# Patient Record
Sex: Female | Born: 1940 | Race: Black or African American | Hispanic: No | Smoking: Never smoker
Health system: Southern US, Community
[De-identification: ages and names within clinical notes are randomized; demographics above are authoritative.]

## PROBLEM LIST (undated history)

## (undated) DIAGNOSIS — G4730 Sleep apnea, unspecified: Secondary | ICD-10-CM

## (undated) DIAGNOSIS — I10 Essential (primary) hypertension: Secondary | ICD-10-CM

## (undated) DIAGNOSIS — F329 Major depressive disorder, single episode, unspecified: Secondary | ICD-10-CM

## (undated) DIAGNOSIS — T8859XA Other complications of anesthesia, initial encounter: Secondary | ICD-10-CM

## (undated) DIAGNOSIS — T4145XA Adverse effect of unspecified anesthetic, initial encounter: Secondary | ICD-10-CM

## (undated) DIAGNOSIS — F32A Depression, unspecified: Secondary | ICD-10-CM

## (undated) DIAGNOSIS — Z972 Presence of dental prosthetic device (complete) (partial): Secondary | ICD-10-CM

## (undated) DIAGNOSIS — E785 Hyperlipidemia, unspecified: Secondary | ICD-10-CM

## (undated) DIAGNOSIS — E119 Type 2 diabetes mellitus without complications: Secondary | ICD-10-CM

## (undated) HISTORY — PX: BILATERAL CARPAL TUNNEL RELEASE: SHX6508

## (undated) HISTORY — DX: Type 2 diabetes mellitus without complications: E11.9

## (undated) HISTORY — PX: BACK SURGERY: SHX140

## (undated) HISTORY — DX: Major depressive disorder, single episode, unspecified: F32.9

## (undated) HISTORY — DX: Depression, unspecified: F32.A

## (undated) HISTORY — PX: ABDOMINAL HYSTERECTOMY: SHX81

## (undated) HISTORY — DX: Sleep apnea, unspecified: G47.30

## (undated) HISTORY — PX: FOOT SURGERY: SHX648

---
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
## 1996-05-09 HISTORY — PX: LUMBAR DISC SURGERY: SHX700

## 2002-05-09 HISTORY — PX: CERVICAL FUSION: SHX112

## 2015-06-18 DIAGNOSIS — M5137 Other intervertebral disc degeneration, lumbosacral region: Secondary | ICD-10-CM | POA: Diagnosis not present

## 2015-06-18 DIAGNOSIS — M5430 Sciatica, unspecified side: Secondary | ICD-10-CM | POA: Diagnosis not present

## 2015-06-18 DIAGNOSIS — M4806 Spinal stenosis, lumbar region: Secondary | ICD-10-CM | POA: Diagnosis not present

## 2015-06-18 DIAGNOSIS — M4316 Spondylolisthesis, lumbar region: Secondary | ICD-10-CM | POA: Diagnosis not present

## 2015-06-18 DIAGNOSIS — M47816 Spondylosis without myelopathy or radiculopathy, lumbar region: Secondary | ICD-10-CM | POA: Diagnosis not present

## 2015-06-18 DIAGNOSIS — M5127 Other intervertebral disc displacement, lumbosacral region: Secondary | ICD-10-CM | POA: Diagnosis not present

## 2015-06-18 DIAGNOSIS — M47815 Spondylosis without myelopathy or radiculopathy, thoracolumbar region: Secondary | ICD-10-CM | POA: Diagnosis not present

## 2015-07-17 DIAGNOSIS — M24571 Contracture, right ankle: Secondary | ICD-10-CM | POA: Diagnosis not present

## 2015-07-17 DIAGNOSIS — M79671 Pain in right foot: Secondary | ICD-10-CM | POA: Diagnosis not present

## 2015-07-17 DIAGNOSIS — M7661 Achilles tendinitis, right leg: Secondary | ICD-10-CM | POA: Diagnosis not present

## 2015-07-17 DIAGNOSIS — M25471 Effusion, right ankle: Secondary | ICD-10-CM | POA: Diagnosis not present

## 2015-07-21 DIAGNOSIS — M5416 Radiculopathy, lumbar region: Secondary | ICD-10-CM | POA: Diagnosis not present

## 2015-07-21 DIAGNOSIS — I10 Essential (primary) hypertension: Secondary | ICD-10-CM | POA: Diagnosis not present

## 2015-07-21 DIAGNOSIS — M4316 Spondylolisthesis, lumbar region: Secondary | ICD-10-CM | POA: Diagnosis not present

## 2015-07-21 DIAGNOSIS — Z6841 Body Mass Index (BMI) 40.0 and over, adult: Secondary | ICD-10-CM | POA: Diagnosis not present

## 2015-07-21 DIAGNOSIS — M4726 Other spondylosis with radiculopathy, lumbar region: Secondary | ICD-10-CM | POA: Diagnosis not present

## 2015-07-22 DIAGNOSIS — J302 Other seasonal allergic rhinitis: Secondary | ICD-10-CM | POA: Diagnosis not present

## 2015-07-22 DIAGNOSIS — I10 Essential (primary) hypertension: Secondary | ICD-10-CM | POA: Diagnosis not present

## 2015-08-11 DIAGNOSIS — M25571 Pain in right ankle and joints of right foot: Secondary | ICD-10-CM | POA: Diagnosis not present

## 2015-08-11 DIAGNOSIS — M25572 Pain in left ankle and joints of left foot: Secondary | ICD-10-CM | POA: Diagnosis not present

## 2015-08-11 DIAGNOSIS — R262 Difficulty in walking, not elsewhere classified: Secondary | ICD-10-CM | POA: Diagnosis not present

## 2015-08-14 DIAGNOSIS — M25572 Pain in left ankle and joints of left foot: Secondary | ICD-10-CM | POA: Diagnosis not present

## 2015-08-14 DIAGNOSIS — M25571 Pain in right ankle and joints of right foot: Secondary | ICD-10-CM | POA: Diagnosis not present

## 2015-08-14 DIAGNOSIS — R262 Difficulty in walking, not elsewhere classified: Secondary | ICD-10-CM | POA: Diagnosis not present

## 2015-08-20 DIAGNOSIS — R262 Difficulty in walking, not elsewhere classified: Secondary | ICD-10-CM | POA: Diagnosis not present

## 2015-08-20 DIAGNOSIS — M25572 Pain in left ankle and joints of left foot: Secondary | ICD-10-CM | POA: Diagnosis not present

## 2015-08-20 DIAGNOSIS — M25571 Pain in right ankle and joints of right foot: Secondary | ICD-10-CM | POA: Diagnosis not present

## 2015-08-24 DIAGNOSIS — I10 Essential (primary) hypertension: Secondary | ICD-10-CM | POA: Diagnosis not present

## 2015-08-24 DIAGNOSIS — E119 Type 2 diabetes mellitus without complications: Secondary | ICD-10-CM | POA: Diagnosis not present

## 2015-08-24 DIAGNOSIS — K219 Gastro-esophageal reflux disease without esophagitis: Secondary | ICD-10-CM | POA: Diagnosis not present

## 2015-08-24 DIAGNOSIS — M1990 Unspecified osteoarthritis, unspecified site: Secondary | ICD-10-CM | POA: Diagnosis not present

## 2015-09-03 DIAGNOSIS — Z1231 Encounter for screening mammogram for malignant neoplasm of breast: Secondary | ICD-10-CM | POA: Diagnosis not present

## 2015-11-11 DIAGNOSIS — H527 Unspecified disorder of refraction: Secondary | ICD-10-CM | POA: Diagnosis not present

## 2015-11-11 DIAGNOSIS — Z7984 Long term (current) use of oral hypoglycemic drugs: Secondary | ICD-10-CM | POA: Diagnosis not present

## 2015-11-11 DIAGNOSIS — E119 Type 2 diabetes mellitus without complications: Secondary | ICD-10-CM | POA: Diagnosis not present

## 2015-11-20 DIAGNOSIS — G4733 Obstructive sleep apnea (adult) (pediatric): Secondary | ICD-10-CM | POA: Diagnosis not present

## 2015-12-02 DIAGNOSIS — E785 Hyperlipidemia, unspecified: Secondary | ICD-10-CM | POA: Diagnosis not present

## 2015-12-02 DIAGNOSIS — I10 Essential (primary) hypertension: Secondary | ICD-10-CM | POA: Diagnosis not present

## 2015-12-02 DIAGNOSIS — E119 Type 2 diabetes mellitus without complications: Secondary | ICD-10-CM | POA: Diagnosis not present

## 2015-12-02 DIAGNOSIS — Z0000 Encounter for general adult medical examination without abnormal findings: Secondary | ICD-10-CM | POA: Diagnosis not present

## 2015-12-02 DIAGNOSIS — K219 Gastro-esophageal reflux disease without esophagitis: Secondary | ICD-10-CM | POA: Diagnosis not present

## 2015-12-23 DIAGNOSIS — I10 Essential (primary) hypertension: Secondary | ICD-10-CM | POA: Diagnosis not present

## 2015-12-23 DIAGNOSIS — R69 Illness, unspecified: Secondary | ICD-10-CM | POA: Diagnosis not present

## 2015-12-23 DIAGNOSIS — H938X9 Other specified disorders of ear, unspecified ear: Secondary | ICD-10-CM | POA: Diagnosis not present

## 2015-12-23 DIAGNOSIS — E119 Type 2 diabetes mellitus without complications: Secondary | ICD-10-CM | POA: Diagnosis not present

## 2015-12-23 DIAGNOSIS — M1990 Unspecified osteoarthritis, unspecified site: Secondary | ICD-10-CM | POA: Diagnosis not present

## 2016-01-14 DIAGNOSIS — R69 Illness, unspecified: Secondary | ICD-10-CM | POA: Diagnosis not present

## 2016-01-25 DIAGNOSIS — M1990 Unspecified osteoarthritis, unspecified site: Secondary | ICD-10-CM | POA: Diagnosis not present

## 2016-01-25 DIAGNOSIS — M5430 Sciatica, unspecified side: Secondary | ICD-10-CM | POA: Diagnosis not present

## 2016-01-25 DIAGNOSIS — E119 Type 2 diabetes mellitus without complications: Secondary | ICD-10-CM | POA: Diagnosis not present

## 2016-01-25 DIAGNOSIS — R69 Illness, unspecified: Secondary | ICD-10-CM | POA: Diagnosis not present

## 2016-02-18 DIAGNOSIS — Z0101 Encounter for examination of eyes and vision with abnormal findings: Secondary | ICD-10-CM | POA: Diagnosis not present

## 2016-02-18 DIAGNOSIS — I10 Essential (primary) hypertension: Secondary | ICD-10-CM | POA: Diagnosis not present

## 2016-02-18 DIAGNOSIS — Z712 Person consulting for explanation of examination or test findings: Secondary | ICD-10-CM | POA: Diagnosis not present

## 2016-04-19 ENCOUNTER — Ambulatory Visit
Admission: EM | Admit: 2016-04-19 | Discharge: 2016-04-19 | Disposition: A | Discharge status: Home / Self Care | Payer: Medicare HMO | Attending: Family Medicine | Admitting: Family Medicine

## 2016-04-19 DIAGNOSIS — H6982 Other specified disorders of Eustachian tube, left ear: Secondary | ICD-10-CM | POA: Diagnosis not present

## 2016-04-19 HISTORY — DX: Essential (primary) hypertension: I10

## 2016-04-19 MED ORDER — LORATADINE 10 MG PO TABS: 10.0000 mg | ORAL_TABLET | Freq: Every day | ORAL | 0 refills | Status: DC

## 2016-04-19 MED ORDER — FLUTICASONE PROPIONATE 50 MCG/ACT NA SUSP: 2.0000 | Freq: Every day | NASAL | 0 refills | Status: DC

## 2016-04-19 NOTE — ED Triage Notes (Signed)
Patient complains of left ear pain x 3 days. Patient states that she also stuck a piece of cotton in her ear and is unsure if she removed it.

## 2016-04-19 NOTE — Discharge Instructions (Signed)
Recommend start Flonase 2 sprays in each nostril daily. May also take Claritin 10mg  once daily. Follow-up with a primary care provider in 4 to 5 days if not improving.

## 2016-04-19 NOTE — ED Provider Notes (Signed)
CSN: [id number]     Arrival date & time 04/19/16  1134 History   First MD Initiated Contact with Patient 04/19/16 1359     Chief Complaint  Patient presents with  . Otalgia    left   (Consider location/radiation/quality/duration/timing/severity/associated sxs/prior Treatment) 75 year old female presents with left ear pain on and off for the past 3 days. Feels pain along left neck as well. Denies any fever, cough, dizziness or GI symptoms. Also experiencing slight nasal congestion. Had placed a cotton swab at base of ear canal to block wind but uncertain if some of the cotton got stuck in her ear canal. Has history of seasonal allergies but has not taken any medication in many months.    The history is provided by the patient.    Past Medical History:  Diagnosis Date  . Hypertension    Past Surgical History:  Procedure Laterality Date  . ABDOMINAL HYSTERECTOMY    . BACK SURGERY     mulitple  . BILATERAL CARPAL TUNNEL RELEASE    . FOOT SURGERY     History reviewed. No pertinent family history. Social History  Substance Use Topics  . Smoking status: Never Smoker  . Smokeless tobacco: Never Used  . Alcohol use No   OB History    No data available     Review of Systems  Constitutional: Negative for chills, diaphoresis, fatigue and fever.  HENT: Positive for congestion, ear pain and postnasal drip. Negative for ear discharge, hearing loss, sinus pain, sinus pressure, sore throat and tinnitus.   Eyes: Negative for discharge.  Respiratory: Negative for cough, chest tightness and wheezing.   Cardiovascular: Negative for chest pain.  Gastrointestinal: Negative for abdominal pain, diarrhea, nausea and vomiting.  Musculoskeletal: Negative for neck pain and neck stiffness.  Skin: Negative for rash.  Neurological: Negative for dizziness, syncope, weakness, light-headedness and headaches.  Hematological: Negative for adenopathy.    Allergies  Patient has no known  allergies.  Home Medications   Prior to Admission medications   Medication Sig Start Date End Date Taking? Authorizing Provider  buPROPion (ZYBAN) 150 MG 12 hr tablet Take 150 mg by mouth 2 (two) times daily.   Yes Historical Provider, MD  [facility] Liver Oil 1000 MG CAPS Take by mouth.   Yes Historical Provider, MD  meloxicam (MOBIC) 15 MG tablet Take 15 mg by mouth daily.   Yes Historical Provider, MD  Omega-3 Fatty Acids (FISH OIL) 1200 MG CPDR Take by mouth.   Yes Historical Provider, MD  valsartan (DIOVAN) 320 MG tablet Take 320 mg by mouth daily.   Yes Historical Provider, MD  fluticasone (FLONASE) 50 MCG/ACT nasal spray Place 2 sprays into both nostrils daily. 04/19/16   Katy Apo, NP  loratadine (CLARITIN) 10 MG tablet Take 1 tablet (10 mg total) by mouth daily. 04/19/16 05/19/16  Katy Apo, NP   Meds Ordered and Administered this Visit  Medications - No data to display  BP 136/60 (BP Location: Left Arm)   Pulse 72   Temp 97.7 F (36.5 C) (Oral)   Resp 17   Ht 5\' 7"  (1.702 m)   Wt 204 lb (92.5 kg)   SpO2 99%   BMI 31.95 kg/m  No data found.   Physical Exam  Constitutional: She is oriented to person, place, and time. She appears well-developed and well-nourished. No distress.  HENT:  Head: Normocephalic and atraumatic.  Right Ear: Hearing, tympanic membrane, external ear and ear canal normal.  Left Ear: External ear and ear canal normal. No drainage, swelling or tenderness. No foreign bodies. Tympanic membrane is bulging. Tympanic membrane is not injected, not perforated and not erythematous. A middle ear effusion is present. Decreased hearing is noted.  Nose: Rhinorrhea present. Right sinus exhibits no maxillary sinus tenderness and no frontal sinus tenderness. Left sinus exhibits no maxillary sinus tenderness and no frontal sinus tenderness.  Mouth/Throat: Uvula is midline, oropharynx is clear and moist and mucous membranes are normal.  Neck: Normal range of  motion. Neck supple.  Cardiovascular: Normal rate, regular rhythm and normal heart sounds.   Pulmonary/Chest: Effort normal and breath sounds normal. No respiratory distress. She has no decreased breath sounds. She has no wheezes. She has no rhonchi.  Lymphadenopathy:    She has no cervical adenopathy.  Neurological: She is alert and oriented to person, place, and time.  Skin: Skin is warm and dry. Capillary refill takes less than 2 seconds.  Psychiatric: She has a normal mood and affect. Her behavior is normal. Judgment and thought content normal.    Urgent Care Course   Clinical Course     Procedures (including critical care time)  Labs Review Labs Reviewed - No data to display  Imaging Review No results found.   Visual Acuity Review  Right Eye Distance:   Left Eye Distance:   Bilateral Distance:    Right Eye Near:   Left Eye Near:    Bilateral Near:         MDM   1. Acute dysfunction of left eustachian tube    Discussed with patient that no foreign bodies/material seen in ear canal. Also discussed that she has fluid behind her left TM but no infection. Recommend trial Flonase 2 sprays each nostril once daily. May also restart Claritin 10mg  once daily. Recommend follow-up with a PCP in 4 to 5 days if not improving.      Katy Apo, NP 04/20/16 [phone]

## 2016-05-11 DIAGNOSIS — K219 Gastro-esophageal reflux disease without esophagitis: Secondary | ICD-10-CM | POA: Diagnosis not present

## 2016-05-11 DIAGNOSIS — E119 Type 2 diabetes mellitus without complications: Secondary | ICD-10-CM | POA: Diagnosis not present

## 2016-05-11 DIAGNOSIS — M1990 Unspecified osteoarthritis, unspecified site: Secondary | ICD-10-CM | POA: Diagnosis not present

## 2016-05-11 DIAGNOSIS — G4730 Sleep apnea, unspecified: Secondary | ICD-10-CM | POA: Diagnosis not present

## 2016-05-11 DIAGNOSIS — M25559 Pain in unspecified hip: Secondary | ICD-10-CM | POA: Diagnosis not present

## 2016-05-11 DIAGNOSIS — M5430 Sciatica, unspecified side: Secondary | ICD-10-CM | POA: Diagnosis not present

## 2016-05-11 DIAGNOSIS — I10 Essential (primary) hypertension: Secondary | ICD-10-CM | POA: Diagnosis not present

## 2016-05-11 DIAGNOSIS — R69 Illness, unspecified: Secondary | ICD-10-CM | POA: Diagnosis not present

## 2016-05-11 DIAGNOSIS — Z13820 Encounter for screening for osteoporosis: Secondary | ICD-10-CM | POA: Diagnosis not present

## 2016-05-11 DIAGNOSIS — M1611 Unilateral primary osteoarthritis, right hip: Secondary | ICD-10-CM | POA: Diagnosis not present

## 2016-05-11 DIAGNOSIS — Z79899 Other long term (current) drug therapy: Secondary | ICD-10-CM | POA: Diagnosis not present

## 2016-05-31 DIAGNOSIS — H43811 Vitreous degeneration, right eye: Secondary | ICD-10-CM | POA: Diagnosis not present

## 2016-06-14 ENCOUNTER — Ambulatory Visit (INDEPENDENT_AMBULATORY_CARE_PROVIDER_SITE_OTHER): Payer: Medicare HMO

## 2016-06-14 ENCOUNTER — Ambulatory Visit
Admission: EM | Admit: 2016-06-14 | Discharge: 2016-06-14 | Disposition: A | Discharge status: Home / Self Care | Payer: Medicare HMO | Attending: Family Medicine | Admitting: Family Medicine

## 2016-06-14 DIAGNOSIS — S39012A Strain of muscle, fascia and tendon of lower back, initial encounter: Secondary | ICD-10-CM

## 2016-06-14 DIAGNOSIS — M47816 Spondylosis without myelopathy or radiculopathy, lumbar region: Secondary | ICD-10-CM | POA: Diagnosis not present

## 2016-06-14 HISTORY — DX: Hyperlipidemia, unspecified: E78.5

## 2016-06-14 LAB — URINALYSIS, COMPLETE (UACMP) WITH MICROSCOPIC
Bilirubin Urine: NEGATIVE
Glucose, UA: NEGATIVE mg/dL
Ketones, ur: NEGATIVE mg/dL
Nitrite: NEGATIVE
Protein, ur: NEGATIVE mg/dL
Specific Gravity, Urine: 1.02 (ref 1.005–1.030)
pH: 6 (ref 5.0–8.0)

## 2016-06-14 MED ORDER — TIZANIDINE HCL 4 MG PO TABS: 4.0000 mg | ORAL_TABLET | Freq: Four times a day (QID) | ORAL | 0 refills | Status: DC | PRN

## 2016-06-14 NOTE — ED Triage Notes (Signed)
Pt c/o right side pain. She says it starts on her right side and runs along her back. It has been happening, however it is becoming more and more frequent the last couple days, when she stands from a sitting position it will pull at her.

## 2016-06-14 NOTE — ED Provider Notes (Signed)
CSN: [id number]     Arrival date & time 06/14/16  0906 History   First MD Initiated Contact with Patient 06/14/16 1017     Chief Complaint  Patient presents with  . Abdominal Pain    right side   (Consider location/radiation/quality/duration/timing/severity/associated sxs/prior Treatment) HPI  This a 76 year old female who presents with right-sided mid back and flank pain. He said this started on her right side and runs from the midline to the flank. She states it is worse with certain movements. Previous back problems for 2 years but states that the last 2 days has been worse. It is a cramping Type sensation. Denies any injuries in her activities. Participate in aerobic water exercises. She does not remember any specific intent or increase in activity. She denies any history of kidney stones. She does have a history of spinal stenosis. She has been taking meloxicam ;  this does not seem to be effective.she states that nothing seems to relieve the pain. Motion exacerbates it. She states she has a history of spinal stenosis       Past Medical History:  Diagnosis Date  . Hyperlipidemia   . Hypertension    Past Surgical History:  Procedure Laterality Date  . ABDOMINAL HYSTERECTOMY    . BACK SURGERY     mulitple  . BILATERAL CARPAL TUNNEL RELEASE    . FOOT SURGERY     No family history on file. Social History  Substance Use Topics  . Smoking status: Never Smoker  . Smokeless tobacco: Never Used  . Alcohol use No   OB History    No data available     Review of Systems  Constitutional: Positive for activity change. Negative for chills, fatigue and fever.  Genitourinary: Negative for difficulty urinating, dysuria and hematuria.  Musculoskeletal: Positive for back pain.  All other systems reviewed and are negative.   Allergies  Patient has no known allergies.  Home Medications   Prior to Admission medications   Medication Sig Start Date End Date Taking? Authorizing  Provider  buPROPion (ZYBAN) 150 MG 12 hr tablet Take 150 mg by mouth 2 (two) times daily.   Yes Historical Provider, MD  [facility] Liver Oil 1000 MG CAPS Take by mouth.   Yes Historical Provider, MD  meloxicam (MOBIC) 15 MG tablet Take 15 mg by mouth daily.   Yes Historical Provider, MD  Omega-3 Fatty Acids (FISH OIL) 1200 MG CPDR Take by mouth.   Yes Historical Provider, MD  rosuvastatin (CRESTOR) 10 MG tablet Take 10 mg by mouth daily.   Yes Historical Provider, MD  valsartan (DIOVAN) 320 MG tablet Take 320 mg by mouth daily.   Yes Historical Provider, MD  loratadine (CLARITIN) 10 MG tablet Take 1 tablet (10 mg total) by mouth daily. 04/19/16 05/19/16  Katy Apo, NP  tiZANidine (ZANAFLEX) 4 MG tablet Take 1 tablet (4 mg total) by mouth every 6 (six) hours as needed for muscle spasms. 06/14/16   Lorin Picket, PA-C   Meds Ordered and Administered this Visit  Medications - No data to display  BP (!) 147/65 (BP Location: Left Arm)   Pulse 65   Temp 98.3 F (36.8 C) (Oral)   Resp 18   Ht 5\' 7"  (1.702 m)   Wt 204 lb (92.5 kg)   SpO2 100%   BMI 31.95 kg/m  No data found.   Physical Exam  Constitutional: She is oriented to person, place, and time. She appears well-developed and well-nourished. No  distress.  HENT:  Head: Normocephalic and atraumatic.  Eyes: EOM are normal. Pupils are equal, round, and reactive to light.  Neck: Normal range of motion. Neck supple.  Pulmonary/Chest: Effort normal and breath sounds normal. No respiratory distress. She has no wheezes. She has no rales.  Abdominal: Soft. Bowel sounds are normal. She exhibits no distension and no mass. There is no tenderness. There is no rebound and no guarding.  There is no CVA tenderness  Musculoskeletal: She exhibits tenderness. She exhibits no edema or deformity.  Examination of lumbar spine shows those of forward flexion creating a pulling sensation on the right lower back. Lateral flexion is normal but with the pain  motion away from her back pain. With recumbencypalpation  of the paraspinous muscles at the L1-L2 level level on the right paraspinous muscles This causes her to have her discomfort around towards the flank.  Neurological: She is alert and oriented to person, place, and time.  Skin: Skin is warm and dry. She is not diaphoretic.  Psychiatric: She has a normal mood and affect. Her behavior is normal. Judgment and thought content normal.  Nursing note and vitals reviewed.   Urgent Care Course     Procedures (including critical care time)  Labs Review Labs Reviewed  URINALYSIS, COMPLETE (UACMP) WITH MICROSCOPIC - Abnormal; Notable for the following:       Result Value   Hgb urine dipstick TRACE (*)    Leukocytes, UA TRACE (*)    Squamous Epithelial / LPF 0-5 (*)    Bacteria, UA FEW (*)    All other components within normal limits    Imaging Review Dg Lumbar Spine Complete  Result Date: 06/14/2016 CLINICAL DATA:  76 year old female with right-sided pain for 2 weeks. Right leg numbness and weakness. Cannot stand for a long time. Lumbar surgery 4 years ago. Initial encounter. EXAM: LUMBAR SPINE - COMPLETE 4+ VIEW COMPARISON:  None. FINDINGS: T10-11 through L3-4 without significant disc space narrowing. Anterior osteophytes at several levels most notable L3-4 level. L4-5 prominent facet degenerative changes with 3 mm anterior slip L4. Very mild disc space narrowing. Anterior osteophyte. L5-S1 prominent facet degenerative changes. Mild to moderate disc space narrowing. Anterior osteophyte. Moderate amount of stool throughout the colon. IMPRESSION: L4-5 prominent facet degenerative changes with 3 mm anterior slip L4. Very mild disc space narrowing. Anterior osteophyte. L5-S1 prominent facet degenerative changes. Mild to moderate disc space narrowing. Anterior osteophyte. Prominent anterior osteophytes lower thoracic and upper lumbar spine most prominent L3-4 level. Electronically Signed   By: Genia Del M.D.   On: 06/14/2016 11:17     Visual Acuity Review  Right Eye Distance:   Left Eye Distance:   Bilateral Distance:    Right Eye Near:   Left Eye Near:    Bilateral Near:         MDM   1. Lumbar strain, initial encounter    New Prescriptions   TIZANIDINE (ZANAFLEX) 4 MG TABLET    Take 1 tablet (4 mg total) by mouth every 6 (six) hours as needed for muscle spasms.  Plan: 1. Test/x-ray results and diagnosis reviewed with patient 2. rx as per orders; risks, benefits, potential side effects reviewed with patient 3. Recommend supportive treatment with Symptom avoidance and rest. Encouraged her to remain active in water aerobics. She'll continue with her Mobic 15 mg daily. Advised to use ice 20 minutes every 2 hours and may alternate with heat. If she is not improved she will follow-up with  a primary care physician. 4. F/u prn if symptoms worsen or don't improve     Lorin Picket, PA-C 06/14/16 1159

## 2016-06-16 ENCOUNTER — Telehealth: Payer: Self-pay

## 2016-06-16 NOTE — Telephone Encounter (Signed)
Courtesy call back completed today after patient's visit at [facility]. Patient improved and will call back with any questions or concerns.  

## 2016-06-24 ENCOUNTER — Ambulatory Visit (INDEPENDENT_AMBULATORY_CARE_PROVIDER_SITE_OTHER): Payer: Medicare HMO | Admitting: Family Medicine

## 2016-06-24 ENCOUNTER — Encounter: Payer: Self-pay | Admitting: Family Medicine

## 2016-06-24 VITALS — BP 128/82 | HR 77 | Temp 97.8°F | Ht 66.5 in | Wt 206.0 lb

## 2016-06-24 DIAGNOSIS — E785 Hyperlipidemia, unspecified: Secondary | ICD-10-CM | POA: Diagnosis not present

## 2016-06-24 DIAGNOSIS — Z8249 Family history of ischemic heart disease and other diseases of the circulatory system: Secondary | ICD-10-CM | POA: Diagnosis not present

## 2016-06-24 DIAGNOSIS — J309 Allergic rhinitis, unspecified: Secondary | ICD-10-CM

## 2016-06-24 DIAGNOSIS — Z79899 Other long term (current) drug therapy: Secondary | ICD-10-CM

## 2016-06-24 DIAGNOSIS — M5136 Other intervertebral disc degeneration, lumbar region: Secondary | ICD-10-CM | POA: Diagnosis not present

## 2016-06-24 DIAGNOSIS — M5386 Other specified dorsopathies, lumbar region: Secondary | ICD-10-CM | POA: Insufficient documentation

## 2016-06-24 DIAGNOSIS — Z8619 Personal history of other infectious and parasitic diseases: Secondary | ICD-10-CM

## 2016-06-24 DIAGNOSIS — F419 Anxiety disorder, unspecified: Secondary | ICD-10-CM

## 2016-06-24 DIAGNOSIS — R69 Illness, unspecified: Secondary | ICD-10-CM | POA: Diagnosis not present

## 2016-06-24 DIAGNOSIS — Z23 Encounter for immunization: Secondary | ICD-10-CM

## 2016-06-24 DIAGNOSIS — I10 Essential (primary) hypertension: Secondary | ICD-10-CM

## 2016-06-24 DIAGNOSIS — E669 Obesity, unspecified: Secondary | ICD-10-CM

## 2016-06-24 DIAGNOSIS — E118 Type 2 diabetes mellitus with unspecified complications: Secondary | ICD-10-CM | POA: Insufficient documentation

## 2016-06-24 DIAGNOSIS — K219 Gastro-esophageal reflux disease without esophagitis: Secondary | ICD-10-CM | POA: Diagnosis not present

## 2016-06-24 DIAGNOSIS — E119 Type 2 diabetes mellitus without complications: Secondary | ICD-10-CM

## 2016-06-24 DIAGNOSIS — E559 Vitamin D deficiency, unspecified: Secondary | ICD-10-CM | POA: Insufficient documentation

## 2016-06-24 DIAGNOSIS — E1169 Type 2 diabetes mellitus with other specified complication: Secondary | ICD-10-CM | POA: Insufficient documentation

## 2016-06-24 MED ORDER — BUPROPION HCL ER (XL) 150 MG PO TB24: 150.0000 mg | ORAL_TABLET | Freq: Every day | ORAL | Status: DC

## 2016-06-24 MED ORDER — ACETAMINOPHEN 500 MG PO TABS: 1000.0000 mg | ORAL_TABLET | Freq: Two times a day (BID) | ORAL | 0 refills | Status: DC | PRN

## 2016-06-24 MED ORDER — LORATADINE 10 MG PO TABS: 10.0000 mg | ORAL_TABLET | Freq: Every day | ORAL | 0 refills | Status: DC | PRN

## 2016-06-24 NOTE — Patient Instructions (Addendum)
Stop meloxicam (mobic), pantoprazole (Protonix),  and rosuvastatin (Crestor). May use Tylenol Extra-Strength (500 mg) two tablets twice daily for arthritis/back pain.

## 2016-06-24 NOTE — Progress Notes (Signed)
Date:  06/24/2016   Name:  Molly Ruiz   DOB:  Mar 05, 1941   MRN:  [id number]  PCP:  Adline Potter, MD    Chief Complaint: Establish Care and Back Pain (Pt stated went to urgent care 1 week ago and had pulled muscle.)   History of Present Illness:  This is a 76 y.o. female seen for initial visit, moved here from [place] in October. Saw PCP there las month, dx'd with vit D def, started on 50K units weekly x 7 wks. Also started on Crestor for HLD but would prefer not to take. Seen [facility] two weeks ago for R flank pain, placed on Zanaflex with resolution, XR showed extensive OA and DJD. Hx L lumbar laminectomy 2000 and cervical fusion 2004, saw neurosurgeon nine months ago, told non-operative. Pt does c/o int RLE pain worse with walking or prolonged standing. On Mobic past year, has not tried to stop. Hx T2DM x yrs, a1c 6% last month per pt, never on meds. Takes Exforge for HTN, Wellbutrin for anxiety, CLO to prevent colds during winter, Claritin prn AR sxs. Hx GERD uses Protonix 2x/wk on average, has not tried to stop. Takes tramadol daily in AM for back pain. Father died MI age 2, mother died DM age 9, 1/2 brothers with CVA, sister with heart problem. Told allergic to flu imms by derm years ago, got rash. Pneumo imm x1 only yrs ago, tet status unknown, hx shingles but no known zoster imm. Saw optho last month. Mammo last yr ok, colonoscopy 8 yrs ago ok.  Review of Systems:  Review of Systems  Constitutional: Negative for chills and fever.  HENT: Negative for ear pain and sore throat.   Eyes: Negative for pain.  Respiratory: Negative for cough and shortness of breath.   Cardiovascular: Negative for chest pain and leg swelling.  Gastrointestinal: Negative for abdominal pain.  Endocrine: Negative for polydipsia and polyuria.  Genitourinary: Negative for dysuria.  Musculoskeletal: Negative for myalgias.  Neurological: Negative for tremors, syncope and light-headedness.  Hematological: Negative  for adenopathy.    Patient Active Problem List   Diagnosis Date Noted  . Controlled type 2 diabetes mellitus without complication, without long-term current use of insulin ([facility]) 06/24/2016  . Hypertension 06/24/2016  . Degenerative disc disease, lumbar 06/24/2016  . Hyperlipidemia 06/24/2016  . Anxiety disorder 06/24/2016  . Obesity (BMI 30.0-34.9) 06/24/2016  . Current use of proton pump inhibitor 06/24/2016  . Vitamin D deficiency 06/24/2016  . History of shingles 06/24/2016  . GERD (gastroesophageal reflux disease) 06/24/2016    Prior to Admission medications   Medication Sig Start Date End Date Taking? Authorizing Provider  amLODipine-valsartan (EXFORGE) 5-320 MG tablet  05/26/16  Yes Historical Provider, MD  buPROPion (ZYBAN) 150 MG 12 hr tablet Take 150 mg by mouth 2 (two) times daily.   Yes Historical Provider, MD  [facility] Liver Oil 1000 MG CAPS Take by mouth.   Yes Historical Provider, MD  Omega-3 Fatty Acids (FISH OIL) 1200 MG CPDR Take by mouth.   Yes Historical Provider, MD  traMADol Veatrice Bourbon) 50 MG tablet  06/10/16  Yes Historical Provider, MD  acetaminophen (TYLENOL) 500 MG tablet Take 2 tablets (1,000 mg total) by mouth 2 (two) times daily as needed. 06/24/16   Adline Potter, MD  loratadine (CLARITIN) 10 MG tablet Take 1 tablet (10 mg total) by mouth daily. 04/19/16 05/19/16  Katy Apo, NP  tiZANidine (ZANAFLEX) 4 MG tablet Take 1 tablet (4 mg total) by mouth every 6 (  six) hours as needed for muscle spasms. Patient not taking: Reported on 06/24/2016 06/14/16   Lorin Picket, PA-C    Allergies  Allergen Reactions  . Influenza Vaccines Rash    Past Surgical History:  Procedure Laterality Date  . ABDOMINAL HYSTERECTOMY    . BACK SURGERY     mulitple  . BILATERAL CARPAL TUNNEL RELEASE    . FOOT SURGERY      Social History  Substance Use Topics  . Smoking status: Never Smoker  . Smokeless tobacco: Never Used  . Alcohol use No    Family History  Problem  Relation Age of Onset  . Diabetes Mother   . Stroke Mother   . Heart disease Sister   . Stroke Brother     Medication list has been reviewed and updated.  Physical Examination: BP 128/82   Pulse 77   Temp 97.8 F (36.6 C)   Ht 5' 6.5" (1.689 m)   Wt 206 lb (93.4 kg)   SpO2 99%   BMI 32.75 kg/m   Physical Exam  Constitutional: She is oriented to person, place, and time. She appears well-developed and well-nourished.  HENT:  Head: Normocephalic and atraumatic.  Right Ear: External ear normal.  Left Ear: External ear normal.  TMs clear  Eyes: Conjunctivae and EOM are normal. Pupils are equal, round, and reactive to light.  Neck: Neck supple. No thyromegaly present.  Cardiovascular: Normal rate, regular rhythm, normal heart sounds and intact distal pulses.   Pulmonary/Chest: Effort normal and breath sounds normal.  Abdominal: Soft. She exhibits no distension and no mass. There is no tenderness.  Musculoskeletal: Normal range of motion. She exhibits no edema.  Negative B SLR   Lymphadenopathy:    She has no cervical adenopathy.  Neurological: She is alert and oriented to person, place, and time. Coordination normal.  Romberg negative, gait normal  Skin: Skin is warm and dry.  Psychiatric: She has a normal mood and affect. Her behavior is normal.  Nursing note and vitals reviewed.   Assessment and Plan:  1. Controlled type 2 diabetes mellitus without complication, without long-term current use of insulin ([facility]) Diet controlled, unclear if diabetic or prediabetic, optho ok 1 month ago, needs MCR - HgB A1c  2. Essential hypertension Well controlled on Exforge - Comprehensive Metabolic Panel (CMET) - CBC  3. Hyperlipidemia, unspecified hyperlipidemia type Unclear indication for statin given lack of established CVD, d/c Crestor (pt prefers not to take), cont fish oil for now - Lipid Profile  4. Degenerative disc disease, lumbar Discussed risks LT Mobic use, trial  change to Tylenol 1000 mg bid prn, cont tramadol for now  5. Anxiety disorder, unspecified type Adequate control on Wellbutrin XL  6. Obesity (BMI 30.0-34.9) Exercise/weight loss discussed - TSH  7. Vitamin D deficiency On high dose replacement, consider recheck level next visit  8. Current use of proton pump inhibitor - B12  9. Gastroesophageal reflux disease, esophagitis presence not specified Discussed risks of LT PPI, trial off Protonix (stopping Mobic may help)  10. History of shingles Discuss zoster imm next visit  11. Chronic allergic rhinitis, unspecified seasonality, unspecified trigger Cont prn Claritin  12. FH: heart disease  13. Need for diphtheria-tetanus-pertussis (Tdap) vaccine - Tdap vaccine greater than or equal to 7yo IM  14. Need for pneumococcal vaccination - Pneumococcal conjugate vaccine 13-valent  Return in about 4 weeks (around 07/22/2016).  45 minutes spent with pt, over half in counseling  Satira Anis. Burt Ek. MD  [facility]  06/24/2016

## 2016-06-25 LAB — COMPREHENSIVE METABOLIC PANEL
A/G RATIO: 1.3 (ref 1.2–2.2)
ALBUMIN: 4.3 g/dL (ref 3.5–4.8)
ALK PHOS: 118 IU/L — AB (ref 39–117)
ALT: 13 IU/L (ref 0–32)
AST: 21 IU/L (ref 0–40)
BILIRUBIN TOTAL: 0.3 mg/dL (ref 0.0–1.2)
BUN / CREAT RATIO: 19 (ref 12–28)
BUN: 16 mg/dL (ref 8–27)
CHLORIDE: 100 mmol/L (ref 96–106)
CO2: 25 mmol/L (ref 18–29)
Calcium: 9.8 mg/dL (ref 8.7–10.3)
Creatinine, Ser: 0.83 mg/dL (ref 0.57–1.00)
GFR calc non Af Amer: 69 mL/min/{1.73_m2} (ref >59)
GFR, EST AFRICAN AMERICAN: 80 mL/min/{1.73_m2} (ref >59)
GLUCOSE: 114 mg/dL — AB (ref 65–99)
Globulin, Total: 3.3 g/dL (ref 1.5–4.5)
POTASSIUM: 4.5 mmol/L (ref 3.5–5.2)
Sodium: 140 mmol/L (ref 134–144)
TOTAL PROTEIN: 7.6 g/dL (ref 6.0–8.5)

## 2016-06-25 LAB — CBC
Hematocrit: 40.1 % (ref 34.0–46.6)
Hemoglobin: 13.2 g/dL (ref 11.1–15.9)
MCH: 29.9 pg (ref 26.6–33.0)
MCHC: 32.9 g/dL (ref 31.5–35.7)
MCV: 91 fL (ref 79–97)
Platelets: 233 10*3/uL (ref 150–379)
RBC: 4.42 x10E6/uL (ref 3.77–5.28)
RDW: 14.3 % (ref 12.3–15.4)
WBC: 5.3 10*3/uL (ref 3.4–10.8)

## 2016-06-25 LAB — HEMOGLOBIN A1C
ESTIMATED AVERAGE GLUCOSE: 128 mg/dL
HEMOGLOBIN A1C: 6.1 % — AB (ref 4.8–5.6)

## 2016-06-25 LAB — LIPID PANEL
CHOL/HDL RATIO: 3.8 ratio (ref 0.0–4.4)
Cholesterol, Total: 219 mg/dL — ABNORMAL HIGH (ref 100–199)
HDL: 58 mg/dL (ref >39)
LDL Calculated: 91 mg/dL (ref 0–99)
Triglycerides: 348 mg/dL — ABNORMAL HIGH (ref 0–149)
VLDL Cholesterol Cal: 70 mg/dL — ABNORMAL HIGH (ref 5–40)

## 2016-06-25 LAB — VITAMIN B12: VITAMIN B 12: 399 pg/mL (ref 232–1245)

## 2016-06-25 LAB — TSH: TSH: 1.1 u[IU]/mL (ref 0.450–4.500)

## 2016-07-19 ENCOUNTER — Encounter: Payer: Self-pay | Admitting: Family Medicine

## 2016-07-19 ENCOUNTER — Ambulatory Visit (INDEPENDENT_AMBULATORY_CARE_PROVIDER_SITE_OTHER): Payer: Medicare HMO | Admitting: Family Medicine

## 2016-07-19 VITALS — BP 122/64 | HR 84 | Ht 66.5 in | Wt 204.0 lb

## 2016-07-19 DIAGNOSIS — R69 Illness, unspecified: Secondary | ICD-10-CM | POA: Diagnosis not present

## 2016-07-19 DIAGNOSIS — E66811 Obesity, class 1: Secondary | ICD-10-CM

## 2016-07-19 DIAGNOSIS — E119 Type 2 diabetes mellitus without complications: Secondary | ICD-10-CM | POA: Diagnosis not present

## 2016-07-19 DIAGNOSIS — M5136 Other intervertebral disc degeneration, lumbar region: Secondary | ICD-10-CM

## 2016-07-19 DIAGNOSIS — Z8619 Personal history of other infectious and parasitic diseases: Secondary | ICD-10-CM

## 2016-07-19 DIAGNOSIS — E559 Vitamin D deficiency, unspecified: Secondary | ICD-10-CM | POA: Diagnosis not present

## 2016-07-19 DIAGNOSIS — F419 Anxiety disorder, unspecified: Secondary | ICD-10-CM | POA: Diagnosis not present

## 2016-07-19 DIAGNOSIS — M51369 Other intervertebral disc degeneration, lumbar region without mention of lumbar back pain or lower extremity pain: Secondary | ICD-10-CM

## 2016-07-19 DIAGNOSIS — E669 Obesity, unspecified: Secondary | ICD-10-CM | POA: Diagnosis not present

## 2016-07-19 DIAGNOSIS — I10 Essential (primary) hypertension: Secondary | ICD-10-CM | POA: Diagnosis not present

## 2016-07-19 MED ORDER — ACETAMINOPHEN 500 MG PO TABS: 1000.0000 mg | ORAL_TABLET | Freq: Three times a day (TID) | ORAL | 0 refills | Status: DC | PRN

## 2016-07-19 MED ORDER — ZOSTER VAC RECOMB ADJUVANTED 50 MCG/0.5ML IM SUSR: 1.0000 | Freq: Once | INTRAMUSCULAR | 1 refills | Status: AC

## 2016-07-19 NOTE — Progress Notes (Signed)
Date:  07/19/2016   Name:  Molly Ruiz   DOB:  [birth year]   MRN:  [id number]  PCP:  Adline Potter, MD    Chief Complaint: Follow-up (pulled muscle- pt feeling better)   History of Present Illness:  This is a 76 y.o. female seen for one month f/u from initial visit. Generally feels better. Stopped Crestor, still on fish oil. Stopped Mobic, taking Tylenol prn only. Anxiety stable on Wellbutrin. Off Protonix without sx recurrence. C/o increased back and BLE pain, saw NSGY in [place], told had slippage but no surgery recommended as did not correspond to sxs but now does correspond. Takes tramadol prn. Still on vit D 50K weekly for another 3 weeks. For DEXA Friday. Agrees to zoster imm.  Review of Systems:  Review of Systems  Constitutional: Negative for chills and fever.  Respiratory: Negative for cough and shortness of breath.   Cardiovascular: Negative for chest pain and leg swelling.  Gastrointestinal: Negative for abdominal pain.  Endocrine: Negative for polydipsia and polyuria.  Genitourinary: Negative for difficulty urinating and dysuria.  Neurological: Negative for syncope and light-headedness.    Patient Active Problem List   Diagnosis Date Noted  . Controlled type 2 diabetes mellitus without complication, without long-term current use of insulin ([place]) 06/24/2016  . Hypertension 06/24/2016  . Degenerative disc disease, lumbar 06/24/2016  . Hyperlipidemia 06/24/2016  . Anxiety disorder 06/24/2016  . Obesity (BMI 30.0-34.9) 06/24/2016  . Current use of proton pump inhibitor 06/24/2016  . Vitamin D deficiency 06/24/2016  . History of shingles 06/24/2016  . GERD (gastroesophageal reflux disease) 06/24/2016  . FH: heart disease 06/24/2016  . Allergic rhinitis 06/24/2016    Prior to Admission medications   Medication Sig Start Date End Date Taking? Authorizing Provider  amLODipine-valsartan (EXFORGE) 5-320 MG tablet  05/26/16  Yes Historical Provider, MD  buPROPion  (WELLBUTRIN XL) 150 MG 24 hr tablet Take 1 tablet (150 mg total) by mouth daily. 06/24/16  Yes Adline Potter, MD  Cod Liver Oil 1000 MG CAPS Take by mouth.   Yes Historical Provider, MD  loratadine (CLARITIN) 10 MG tablet Take 1 tablet (10 mg total) by mouth daily as needed for allergies. 06/24/16 07/24/16 Yes Adline Potter, MD  Omega-3 Fatty Acids (FISH OIL) 1200 MG CPDR Take 1 capsule by mouth daily.   Yes Historical Provider, MD  traMADol (ULTRAM) 50 MG tablet Take 1 tablet by mouth every 6 (six) hours as needed. 06/10/16  Yes Historical Provider, MD  acetaminophen (TYLENOL) 500 MG tablet Take 2 tablets (1,000 mg total) by mouth every 8 (eight) hours as needed. 07/19/16   Adline Potter, MD  Zoster Vac Recomb Adjuvanted [facility]) 50 MCG SUSR Inject 1 Dose into the muscle once. 07/19/16 07/19/16  Adline Potter, MD    Allergies  Allergen Reactions  . Influenza Vaccines Rash    Past Surgical History:  Procedure Laterality Date  . ABDOMINAL HYSTERECTOMY    . BACK SURGERY     mulitple  . BILATERAL CARPAL TUNNEL RELEASE    . FOOT SURGERY      Social History  Substance Use Topics  . Smoking status: Never Smoker  . Smokeless tobacco: Never Used  . Alcohol use No    Family History  Problem Relation Age of Onset  . Diabetes Mother   . Stroke Mother   . Heart disease Sister   . Stroke Brother     Medication list has been reviewed and updated.  Physical Examination: BP 122/64   Pulse  84   Ht 5' 6.5" (1.689 m)   Wt 204 lb (92.5 kg)   BMI 32.43 kg/m   Physical Exam  Constitutional: She appears well-developed and well-nourished.  Cardiovascular: Normal rate, regular rhythm and normal heart sounds.   Pulmonary/Chest: Effort normal and breath sounds normal.  Musculoskeletal: She exhibits no edema.  Neurological: She is alert.  Skin: Skin is warm and dry.  Psychiatric: She has a normal mood and affect. Her behavior is normal.  Nursing note and vitals reviewed.   Assessment and  Plan:  1. Controlled type 2 diabetes mellitus without complication, without long-term current use of insulin (HCC) A1c 6.1% on diet alone, discussed diet/weight loss, feels better off statin (10 yr CVR 17%), saw optho past year, consider repeat a1c/lipids/MCR next visit  2. Essential hypertension Well controlled on Exforge  3. Anxiety disorder, unspecified type Well controlled on Wellbutrin  4. Vitamin D deficiency Well controlled on supplement  5. Obesity (BMI 30.0-34.9) Weight down 2#, exercise/weight loss discussed  6. Degenerative disc disease, lumbar Cont Tylenol/tramadol prn, consider NSGY re-referral if sxs worsen/persist  7. History of shingles Shingrix ordered  8. Med review Consider d/c cod liver oil/fish oil next visit  Return in about 3 months (around 10/19/2016).  Satira Anis. [facility]  07/19/2016

## 2016-07-20 ENCOUNTER — Other Ambulatory Visit: Payer: Self-pay | Admitting: Family Medicine

## 2016-07-20 ENCOUNTER — Telehealth: Payer: Self-pay

## 2016-07-20 MED ORDER — BUPROPION HCL ER (XL) 150 MG PO TB24: 150.0000 mg | ORAL_TABLET | Freq: Every day | ORAL | 3 refills | Status: DC

## 2016-07-20 NOTE — Telephone Encounter (Signed)
Patient said you asked her to call office when she needs refill of Wellbutrin 150. I explained you did not see her for this and have not written but she said you agreed to write this when she ran out.

## 2016-07-20 NOTE — Telephone Encounter (Signed)
Rx sent 

## 2016-07-21 ENCOUNTER — Other Ambulatory Visit: Payer: Self-pay | Admitting: Family Medicine

## 2016-07-21 ENCOUNTER — Telehealth: Payer: Self-pay

## 2016-07-21 MED ORDER — BUPROPION HCL ER (XL) 150 MG PO TB24: 150.0000 mg | ORAL_TABLET | Freq: Every day | ORAL | 3 refills | Status: DC

## 2016-07-21 NOTE — Telephone Encounter (Signed)
Sent to [place] yesterday #90 with 3R

## 2016-07-21 NOTE — Telephone Encounter (Signed)
Calling for refill. I see in chart but it says no print and does not look like it went over to pharmacy. [facility] [place]

## 2016-07-29 ENCOUNTER — Encounter: Payer: Self-pay | Admitting: Family Medicine

## 2016-07-29 ENCOUNTER — Ambulatory Visit (INDEPENDENT_AMBULATORY_CARE_PROVIDER_SITE_OTHER): Payer: Medicare HMO | Admitting: Family Medicine

## 2016-07-29 VITALS — BP 158/62 | HR 71 | Ht 67.0 in | Wt 202.0 lb

## 2016-07-29 DIAGNOSIS — K5901 Slow transit constipation: Secondary | ICD-10-CM

## 2016-07-29 DIAGNOSIS — I10 Essential (primary) hypertension: Secondary | ICD-10-CM

## 2016-07-29 DIAGNOSIS — E119 Type 2 diabetes mellitus without complications: Secondary | ICD-10-CM

## 2016-07-29 DIAGNOSIS — F419 Anxiety disorder, unspecified: Secondary | ICD-10-CM | POA: Diagnosis not present

## 2016-07-29 DIAGNOSIS — E669 Obesity, unspecified: Secondary | ICD-10-CM

## 2016-07-29 DIAGNOSIS — R69 Illness, unspecified: Secondary | ICD-10-CM | POA: Diagnosis not present

## 2016-07-29 DIAGNOSIS — E559 Vitamin D deficiency, unspecified: Secondary | ICD-10-CM | POA: Diagnosis not present

## 2016-07-29 MED ORDER — METHYLCELLULOSE (LAXATIVE) PO POWD: 1.0000 | Freq: Two times a day (BID) | ORAL | Status: DC | PRN

## 2016-07-29 NOTE — Patient Instructions (Addendum)
Take Citrucel tonight and tomorrow, call if your symptoms worsen or persist. Stop cod liver oil and fish oil.  Constipation, Adult Constipation is when a person has fewer bowel movements in a week than normal, has difficulty having a bowel movement, or has stools that are dry, hard, or larger than normal. Constipation may be caused by an underlying condition. It may become worse with age if a person takes certain medicines and does not take in enough fluids. Follow these instructions at home: Eating and drinking    Eat foods that have a lot of fiber, such as fresh fruits and vegetables, whole grains, and beans.  Limit foods that are high in fat, low in fiber, or overly processed, such as french fries, hamburgers, cookies, candies, and soda.  Drink enough fluid to keep your urine clear or pale yellow. General instructions   Exercise regularly or as told by your health care provider.  Go to the restroom when you have the urge to go. Do not hold it in.  Take over-the-counter and prescription medicines only as told by your health care provider. These include any fiber supplements.  Practice pelvic floor retraining exercises, such as deep breathing while relaxing the lower abdomen and pelvic floor relaxation during bowel movements.  Watch your condition for any changes.  Keep all follow-up visits as told by your health care provider. This is important. Contact a health care provider if:  You have pain that gets worse.  You have a fever.  You do not have a bowel movement after 4 days.  You vomit.  You are not hungry.  You lose weight.  You are bleeding from the anus.  You have thin, pencil-like stools. Get help right away if:  You have a fever and your symptoms suddenly get worse.  You leak stool or have blood in your stool.  Your abdomen is bloated.  You have severe pain in your abdomen.  You feel dizzy or you faint. This information is not intended to replace advice  given to you by your health care provider. Make sure you discuss any questions you have with your health care provider. Document Released: 01/22/2004 Document Revised: 11/13/2015 Document Reviewed: 10/14/2015 Elsevier Interactive Patient Education  2017 [employer].

## 2016-07-29 NOTE — Progress Notes (Signed)
Date:  07/29/2016   Name:  Molly Ruiz   DOB:  [birth year]   MRN:  [id number]  PCP:  Molly Ruiz    Chief Complaint: Gas (Stomach feels full. Bloated. Seen practitioner in [place] (Dr. Marvel Plan) at one time in life and was told she had inflammation in stomach.)   History of Present Illness:  This is a 76 y.o. female seen for 1d hx abdominal bloating worse with prolonged sitting. Drove to [facility] yesterday. Better today than yesterday. Told in past had abdominal inflammation but does not remember how treated. No BM's in two days which is unusual for her.   Review of Systems:  Review of Systems  Constitutional: Negative for chills and fever.  Respiratory: Negative for shortness of breath.   Gastrointestinal: Negative for abdominal distention, blood in stool, nausea and vomiting.  Genitourinary: Negative for dysuria.  Neurological: Negative for syncope and light-headedness.    Patient Active Problem List   Diagnosis Date Noted  . Controlled type 2 diabetes mellitus without complication, without long-term current use of insulin ([place]) 06/24/2016  . Hypertension 06/24/2016  . Degenerative disc disease, lumbar 06/24/2016  . Hyperlipidemia 06/24/2016  . Anxiety disorder 06/24/2016  . Obesity (BMI 30.0-34.9) 06/24/2016  . Vitamin D deficiency 06/24/2016  . History of shingles 06/24/2016  . GERD (gastroesophageal reflux disease) 06/24/2016  . FH: heart disease 06/24/2016  . Allergic rhinitis 06/24/2016    Prior to Admission medications   Medication Sig Start Date End Date Taking? Authorizing Provider  acetaminophen (TYLENOL) 500 MG tablet Take 2 tablets (1,000 mg total) by mouth every 8 (eight) hours as needed. 07/19/16  Yes Molly Ruiz  amLODipine-valsartan (EXFORGE) 5-320 MG tablet  05/26/16  Yes Historical Provider, Ruiz  buPROPion (WELLBUTRIN XL) 150 MG 24 hr tablet Take 1 tablet (150 mg total) by mouth daily. 07/21/16  Yes Molly Ruiz  traMADol (ULTRAM) 50 MG  tablet Take 1 tablet by mouth every 6 (six) hours as needed. 06/10/16  Yes Historical Provider, Ruiz  methylcellulose (CITRUCEL) oral powder Take 1 packet by mouth 2 (two) times daily as needed. 07/29/16   Molly Ruiz    Allergies  Allergen Reactions  . Influenza Vaccines Rash    Past Surgical History:  Procedure Laterality Date  . ABDOMINAL HYSTERECTOMY    . BACK SURGERY     mulitple  . BILATERAL CARPAL TUNNEL RELEASE    . FOOT SURGERY      Social History  Substance Use Topics  . Smoking status: Never Smoker  . Smokeless tobacco: Never Used  . Alcohol use No    Family History  Problem Relation Age of Onset  . Diabetes Mother   . Stroke Mother   . Heart disease Sister   . Stroke Brother     Medication list has been reviewed and updated.  Physical Examination: BP (!) 158/62 (BP Location: Right Arm, Patient Position: Sitting, Cuff Size: Normal) Comment: pt did not take BP meds this morning.  Pulse 71   Ht 5\' 7"  (1.702 m)   Wt 202 lb (91.6 kg)   SpO2 100%   BMI 31.64 kg/m   Physical Exam  Constitutional: She appears well-developed and well-nourished. No distress.  Abdominal: Soft. Bowel sounds are normal. She exhibits no distension and no mass. There is no tenderness. There is no rebound and no guarding.  Musculoskeletal:  Back non-tender  Neurological: She is alert.  Skin: Skin is warm and dry. She is not diaphoretic.  Psychiatric: She  has a normal mood and affect. Her behavior is normal.  Nursing note and vitals reviewed.   Assessment and Plan:  1. Slow transit constipation Citrucel bid (has at home), hold cod liver oil and fish oil, call if sxs worsen/persist, consider Senokot or abdominal US then  2. Essential hypertension Poor control today due to discomfort, well controlled last visit  3. Controlled type 2 diabetes mellitus without complication, without long-term current use of insulin ([place]) Well controlled, plan a1c/lipids/MCR next visit  4.  Anxiety disorder, unspecified type Well controlled on Wellbutrin  5. Vitamin D deficiency Well controlled on supplement  6. Obesity (BMI 30.0-34.9) Weight down 2#  Return if symptoms worsen or fail to improve.  Satira Anis. [facility]  07/29/2016

## 2016-08-30 ENCOUNTER — Telehealth: Payer: Self-pay | Admitting: Family Medicine

## 2016-08-30 DIAGNOSIS — R69 Illness, unspecified: Secondary | ICD-10-CM | POA: Diagnosis not present

## 2016-08-30 DIAGNOSIS — E119 Type 2 diabetes mellitus without complications: Secondary | ICD-10-CM

## 2016-08-30 MED ORDER — GLUCOSE BLOOD VI STRP: ORAL_STRIP | 3 refills | Status: DC

## 2016-08-30 MED ORDER — ONETOUCH ULTRASOFT LANCETS MISC: 3 refills | Status: DC

## 2016-08-30 MED ORDER — ONETOUCH ULTRA SYSTEM W/DEVICE KIT: 1.0000 | PACK | Freq: Once | 0 refills | Status: AC

## 2016-08-30 NOTE — Telephone Encounter (Signed)
Pt came in the office and request refills for One Touch Delica lancets, testing strips, & new glucose meter sent to [place]. Pt stated she was getting the supplies from the provider she was seeing prior to switching to Dr. Vicente Masson. Please advise. Thanks TNP

## 2016-09-01 DIAGNOSIS — R69 Illness, unspecified: Secondary | ICD-10-CM | POA: Diagnosis not present

## 2016-09-26 ENCOUNTER — Ambulatory Visit (INDEPENDENT_AMBULATORY_CARE_PROVIDER_SITE_OTHER): Payer: Medicare HMO

## 2016-09-26 VITALS — BP 136/70 | HR 66 | Temp 98.0°F | Resp 15 | Ht 67.0 in | Wt 202.4 lb

## 2016-09-26 DIAGNOSIS — Z13820 Encounter for screening for osteoporosis: Secondary | ICD-10-CM

## 2016-09-26 DIAGNOSIS — Z0000 Encounter for general adult medical examination without abnormal findings: Secondary | ICD-10-CM | POA: Diagnosis not present

## 2016-09-26 NOTE — Progress Notes (Signed)
Subjective:   Molly Ruiz is a 76 y.o. female who presents for an Initial Medicare Annual Wellness Visit.  Review of Systems    N/A  Cardiac Risk Factors include: diabetes mellitus;hypertension;advanced age (>44men, >6 women);obesity (BMI >30kg/m2)     Objective:    Today's Vitals   09/26/16 1451 09/26/16 1458  BP: 136/70   Pulse: 66   Resp: 15   Temp: 98 F (36.7 C)   Weight: 202 lb 6.4 oz (91.8 kg)   Height: 5\' 7"  (1.702 m)   PainSc:  4    Body mass index is 31.7 kg/m.   Current Medications (verified) Outpatient Encounter Prescriptions as of 09/26/2016  Medication Sig  . acetaminophen (TYLENOL) 500 MG tablet Take 2 tablets (1,000 mg total) by mouth every 8 (eight) hours as needed.  [name] Kitchen amLODipine-valsartan (EXFORGE) 5-320 MG tablet   . buPROPion (WELLBUTRIN XL) 150 MG 24 hr tablet Take 1 tablet (150 mg total) by mouth daily.  [name] Kitchen glucose blood test strip Use as instructed  . Lancets (ONETOUCH ULTRASOFT) lancets Use as instructed to check Blood Sugar daily  . traMADol (ULTRAM) 50 MG tablet Take 1 tablet by mouth every 6 (six) hours as needed.  . methylcellulose (CITRUCEL) oral powder Take 1 packet by mouth 2 (two) times daily as needed. (Patient not taking: Reported on 09/26/2016)   No facility-administered encounter medications on file as of 09/26/2016.     Allergies (verified) Influenza vaccines   History: Past Medical History:  Diagnosis Date  . Hyperlipidemia   . Hypertension    Past Surgical History:  Procedure Laterality Date  . ABDOMINAL HYSTERECTOMY    . BACK SURGERY     mulitple  . BILATERAL CARPAL TUNNEL RELEASE    . FOOT SURGERY     Family History  Problem Relation Age of Onset  . Diabetes Mother   . Stroke Mother   . Heart disease Sister   . Stroke Brother    Social History   Occupational History  . Not on file.   Social History Main Topics  . Smoking status: Never Smoker  . Smokeless tobacco: Never Used  . Alcohol use No  . Drug  use: No  . Sexual activity: Not on file    Tobacco Counseling Counseling given: Not Answered   Activities of Daily Living In your present state of health, do you have any difficulty performing the following activities: 09/26/2016  Hearing? N  Vision? N  Difficulty concentrating or making decisions? N  Walking or climbing stairs? Y  Dressing or bathing? N  Doing errands, shopping? N  Preparing Food and eating ? N  Using the Toilet? N  In the past six months, have you accidently leaked urine? N  Do you have problems with loss of bowel control? N  Managing your Medications? N  Managing your Finances? N  Housekeeping or managing your Housekeeping? N    Immunizations and Health Maintenance Immunization History  Administered Date(s) Administered  . Pneumococcal Conjugate-13 06/24/2016  . Tdap 06/24/2016   There are no preventive care reminders to display for this patient.  Patient Care Team: Adline Potter, MD as PCP - General (Family Medicine)  Indicate any recent Medical Services you may have received from other than [facility] providers in the past year (date may be approximate).     Assessment:   This is a routine wellness examination for Molly Ruiz.   Hearing/Vision screen Vision Screening Comments: Sees Dr.Chen annually  Dietary issues and exercise activities discussed:  Current Exercise Habits: Structured exercise class, Time (Minutes): 60, Frequency (Times/Week): 1, Weekly Exercise (Minutes/Week): 60, Intensity: Mild  Goals    . Increase water intake          Recommend to drink 4-5 glasses of water a day.      Depression Screen PHQ 2/9 Scores 09/26/2016 09/26/2016 06/24/2016  PHQ - 2 Score 0 0 0  PHQ- 9 Score 1 - -    Fall Risk Fall Risk  09/26/2016 06/24/2016  Falls in the past year? No No    Cognitive Function:     6CIT Screen 09/26/2016  What Year? 4 points  What month? 0 points  What time? 0 points  Count back from 20 0 points  Months in reverse 0 points    Repeat phrase 2 points  Total Score 6    Screening Tests Health Maintenance  Topic Date Due  . FOOT EXAM  10/19/2016 (Originally 02/17/1951)  . OPHTHALMOLOGY EXAM  12/06/2016 (Originally 02/17/1951)  . COLONOSCOPY  05/09/2017 (Originally 02/17/1991)  . DEXA SCAN  12/06/2017 (Originally 02/16/2006)  . INFLUENZA VACCINE  12/07/2016  . HEMOGLOBIN A1C  12/22/2016  . PNA vac Low Risk Adult (2 of 2 - PPSV23) 06/24/2017  . TETANUS/TDAP  06/24/2026      Plan:  I have personally reviewed and addressed the Medicare Annual Wellness questionnaire and have noted the following in the patient's chart:  A. Medical and social history B. Use of alcohol, tobacco or illicit drugs  C. Current medications and supplements D. Functional ability and status E.  Nutritional status F.  Physical activity G. Advance directives H. List of other physicians I.  Hospitalizations, surgeries, and ER visits in previous 12 months J.  [place] such as hearing and vision if needed, cognitive and depression L. Referrals and appointments - 10/20/2016 9am- Dr.Plonk  In addition, I have reviewed and discussed with patient certain preventive protocols, quality metrics, and best practice recommendations. A written personalized care plan for preventive services as well as general preventive health recommendations were provided to patient.   Signed,  Tyler Aas, LPN Nurse Health Advisor   MD Recommendations:Needs diabetic foot exam.

## 2016-09-26 NOTE — Patient Instructions (Addendum)
Molly Ruiz , Thank you for taking time to come for your Medicare Wellness Visit. I appreciate your ongoing commitment to your health goals. Please review the following plan we discussed and let me know if I can assist you in the future.   Screening recommendations/referrals: Colonoscopy: no longer required Mammogram: no longer required Bone Density: due now Recommended yearly ophthalmology/optometry visit for glaucoma screening and checkup Recommended yearly dental visit for hygiene and checkup  Vaccinations: Influenza vaccine: due 01/2017 Pneumococcal vaccine: Up to date, Pneumovax 23 due 06/2017 Tdap vaccine: up to date Shingles vaccine: due- declined  Advanced directives: [name] Kitchen Advance directive discussed with you today. I have provided a copy for you to complete at home and have notarized. Once this is complete please bring a copy in to our office so we can scan it into your chart. Please bring a copy of your healthcare Power of Attorney at your convenience  Conditions/risks identified: Recommend to drink 4-5 glasses of water a day.  Next appointment: Follow up with Dr.Plonk on 10/20/2016 at [place]. Follow up in one year for your annual wellness exam.    Preventive Care 65 Years and Older, Female Preventive care refers to lifestyle choices and visits with your health care provider that can promote health and wellness. What does preventive care include?  A yearly physical exam. This is also called an annual well check.  Dental exams once or twice a year.  Routine eye exams. Ask your health care provider how often you should have your eyes checked.  Personal lifestyle choices, including:  Daily care of your teeth and gums.  Regular physical activity.  Eating a healthy diet.  Avoiding tobacco and drug use.  Limiting alcohol use.  Practicing safe sex.  Taking low-dose aspirin every day.  Taking vitamin and mineral supplements as recommended by your health care provider. What  happens during an annual well check? The services and screenings done by your health care provider during your annual well check will depend on your age, overall health, lifestyle risk factors, and family history of disease. Counseling  Your health care provider may ask you questions about your:  Alcohol use.  Tobacco use.  Drug use.  Emotional well-being.  Home and relationship well-being.  Sexual activity.  Eating habits.  History of falls.  Memory and ability to understand (cognition).  Work and work Statistician.  Reproductive health. Screening  You may have the following tests or measurements:  Height, weight, and BMI.  Blood pressure.  Lipid and cholesterol levels. These may be checked every 5 years, or more frequently if you are over 12 years old.  Skin check.  Lung cancer screening. You may have this screening every year starting at age 25 if you have a 30-pack-year history of smoking and currently smoke or have quit within the past 15 years.  Fecal occult blood test (FOBT) of the stool. You may have this test every year starting at age 25.  Flexible sigmoidoscopy or colonoscopy. You may have a sigmoidoscopy every 5 years or a colonoscopy every 10 years starting at age 60.  Hepatitis C blood test.  Hepatitis B blood test.  Sexually transmitted disease (STD) testing.  Diabetes screening. This is done by checking your blood sugar (glucose) after you have not eaten for a while (fasting). You may have this done every 1-3 years.  Bone density scan. This is done to screen for osteoporosis. You may have this done starting at age 37.  Mammogram. This may be done  every 1-2 years. Talk to your health care provider about how often you should have regular mammograms. Talk with your health care provider about your test results, treatment options, and if necessary, the need for more tests. Vaccines  Your health care provider may recommend certain vaccines, such  as:  Influenza vaccine. This is recommended every year.  Tetanus, diphtheria, and acellular pertussis (Tdap, Td) vaccine. You may need a Td booster every 10 years.  Zoster vaccine. You may need this after age 76.  Pneumococcal 13-valent conjugate (PCV13) vaccine. One dose is recommended after age 78.  Pneumococcal polysaccharide (PPSV23) vaccine. One dose is recommended after age 56. Talk to your health care provider about which screenings and vaccines you need and how often you need them. This information is not intended to replace advice given to you by your health care provider. Make sure you discuss any questions you have with your health care provider. Document Released: 05/22/2015 Document Revised: 01/13/2016 Document Reviewed: 02/24/2015 Elsevier Interactive Patient Education  2017 [employer] Prevention in the Home Falls can cause injuries. They can happen to people of all ages. There are many things you can do to make your home safe and to help prevent falls. What can I do on the outside of my home?  Regularly fix the edges of walkways and driveways and fix any cracks.  Remove anything that might make you trip as you walk through a door, such as a raised step or threshold.  Trim any bushes or trees on the path to your home.  Use bright outdoor lighting.  Clear any walking paths of anything that might make someone trip, such as rocks or tools.  Regularly check to see if handrails are loose or broken. Make sure that both sides of any steps have handrails.  Any raised decks and porches should have guardrails on the edges.  Have any leaves, snow, or ice cleared regularly.  Use sand or salt on walking paths during winter.  Clean up any spills in your garage right away. This includes oil or grease spills. What can I do in the bathroom?  Use night lights.  Install grab bars by the toilet and in the tub and shower. Do not use towel bars as grab bars.  Use  non-skid mats or decals in the tub or shower.  If you need to sit down in the shower, use a plastic, non-slip stool.  Keep the floor dry. Clean up any water that spills on the floor as soon as it happens.  Remove soap buildup in the tub or shower regularly.  Attach bath mats securely with double-sided non-slip rug tape.  Do not have throw rugs and other things on the floor that can make you trip. What can I do in the bedroom?  Use night lights.  Make sure that you have a light by your bed that is easy to reach.  Do not use any sheets or blankets that are too big for your bed. They should not hang down onto the floor.  Have a firm chair that has side arms. You can use this for support while you get dressed.  Do not have throw rugs and other things on the floor that can make you trip. What can I do in the kitchen?  Clean up any spills right away.  Avoid walking on wet floors.  Keep items that you use a lot in easy-to-reach places.  If you need to reach something above you, use a  strong step stool that has a grab bar.  Keep electrical cords out of the way.  Do not use floor polish or wax that makes floors slippery. If you must use wax, use non-skid floor wax.  Do not have throw rugs and other things on the floor that can make you trip. What can I do with my stairs?  Do not leave any items on the stairs.  Make sure that there are handrails on both sides of the stairs and use them. Fix handrails that are broken or loose. Make sure that handrails are as long as the stairways.  Check any carpeting to make sure that it is firmly attached to the stairs. Fix any carpet that is loose or worn.  Avoid having throw rugs at the top or bottom of the stairs. If you do have throw rugs, attach them to the floor with carpet tape.  Make sure that you have a light switch at the top of the stairs and the bottom of the stairs. If you do not have them, ask someone to add them for you. What  else can I do to help prevent falls?  Wear shoes that:  Do not have high heels.  Have rubber bottoms.  Are comfortable and fit you well.  Are closed at the toe. Do not wear sandals.  If you use a stepladder:  Make sure that it is fully opened. Do not climb a closed stepladder.  Make sure that both sides of the stepladder are locked into place.  Ask someone to hold it for you, if possible.  Clearly mark and make sure that you can see:  Any grab bars or handrails.  First and last steps.  Where the edge of each step is.  Use tools that help you move around (mobility aids) if they are needed. These include:  Canes.  Walkers.  Scooters.  Crutches.  Turn on the lights when you go into a dark area. Replace any light bulbs as soon as they burn out.  Set up your furniture so you have a clear path. Avoid moving your furniture around.  If any of your floors are uneven, fix them.  If there are any pets around you, be aware of where they are.  Review your medicines with your doctor. Some medicines can make you feel dizzy. This can increase your chance of falling. Ask your doctor what other things that you can do to help prevent falls. This information is not intended to replace advice given to you by your health care provider. Make sure you discuss any questions you have with your health care provider. Document Released: 02/19/2009 Document Revised: 10/01/2015 Document Reviewed: 05/30/2014 Elsevier Interactive Patient Education  2017 [employer].

## 2016-10-09 DIAGNOSIS — R69 Illness, unspecified: Secondary | ICD-10-CM | POA: Diagnosis not present

## 2016-10-20 ENCOUNTER — Ambulatory Visit: Payer: Medicare HMO | Admitting: Family Medicine

## 2016-11-11 ENCOUNTER — Ambulatory Visit: Payer: Medicare HMO | Admitting: Family Medicine

## 2016-11-23 ENCOUNTER — Other Ambulatory Visit: Payer: Self-pay | Admitting: Internal Medicine

## 2016-11-23 ENCOUNTER — Telehealth: Payer: Self-pay

## 2016-11-23 MED ORDER — OLMESARTAN MEDOXOMIL 40 MG PO TABS: 40.0000 mg | ORAL_TABLET | Freq: Every day | ORAL | 0 refills | Status: DC

## 2016-11-23 MED ORDER — AMLODIPINE BESYLATE 5 MG PO TABS: 5.0000 mg | ORAL_TABLET | Freq: Every day | ORAL | 0 refills | Status: DC

## 2016-11-23 NOTE — Telephone Encounter (Signed)
Patient takes Amlodipine-valsartan. Calling to ask what to take since there is recall. Can you send Losartan?

## 2016-11-23 NOTE — Telephone Encounter (Signed)
He will need something stronger than losartan and he will need to continue amlodipine.  I have sent in a 30 day supply of both amlodipine and olmesartan.  He needs to see Dr. Vicente Masson in the next 30 days to follow up.

## 2016-11-25 ENCOUNTER — Other Ambulatory Visit: Payer: Self-pay | Admitting: Internal Medicine

## 2016-11-25 ENCOUNTER — Telehealth: Payer: Self-pay

## 2016-11-25 MED ORDER — IRBESARTAN 300 MG PO TABS: 300.0000 mg | ORAL_TABLET | Freq: Every day | ORAL | 0 refills | Status: DC

## 2016-11-25 NOTE — Telephone Encounter (Signed)
Spoke to family about med changes. This new Rx Olmesartan is 200 dollars. Will call insurance and get a list of alternatives that insurance will cover.

## 2016-11-25 NOTE — Telephone Encounter (Signed)
Due to recall on Valsartan we had to change meds to Irbesartan and remain on Amlodipine. Per Dr.Berglund.

## 2016-11-28 NOTE — Telephone Encounter (Signed)
Ok thanks 

## 2016-12-19 ENCOUNTER — Telehealth: Payer: Self-pay | Admitting: Family Medicine

## 2016-12-19 NOTE — Telephone Encounter (Signed)
PATIENT NEEDS REFILL ON traMADol (ULTRAM) 50 MG tablet  [place], [place]

## 2016-12-20 ENCOUNTER — Other Ambulatory Visit: Payer: Self-pay | Admitting: Family Medicine

## 2016-12-20 MED ORDER — ACETAMINOPHEN 500 MG PO TABS: 1000.0000 mg | ORAL_TABLET | Freq: Two times a day (BID) | ORAL | 0 refills | Status: AC

## 2016-12-20 NOTE — Telephone Encounter (Signed)
Doesn't look like we have ever prescribed. Think got from neurosurgeon in [place]. I'd recommend taking Tylenol 1000 mg (two extra-strength tablets) bid and avoiding tramadol.

## 2016-12-20 NOTE — Telephone Encounter (Signed)
Advised pt to take Tylenol 1,000mg  BID and avoid Tramadol- pt understood

## 2016-12-21 ENCOUNTER — Telehealth: Payer: Self-pay

## 2016-12-21 NOTE — Telephone Encounter (Signed)
Patient called about needing CPAP. She had sleep study 2 years ago but did not need machine as she had one from [place]. Her machine now broken.  Per Dr.Plonk we need to get her results and titration from last study.  Called her back to see about the results and she explained that the place in [place] that did the last study is going to supply the new machine.

## 2016-12-21 NOTE — Telephone Encounter (Signed)
Ok thanks 

## 2016-12-23 ENCOUNTER — Other Ambulatory Visit: Payer: Self-pay | Admitting: Family Medicine

## 2016-12-26 ENCOUNTER — Other Ambulatory Visit: Payer: Self-pay

## 2016-12-26 MED ORDER — IRBESARTAN 300 MG PO TABS: 300.0000 mg | ORAL_TABLET | Freq: Every day | ORAL | 1 refills | Status: DC

## 2016-12-27 ENCOUNTER — Other Ambulatory Visit: Payer: Self-pay

## 2016-12-27 MED ORDER — AMLODIPINE BESYLATE 5 MG PO TABS: 5.0000 mg | ORAL_TABLET | Freq: Every day | ORAL | 0 refills | Status: DC

## 2016-12-28 DIAGNOSIS — G4733 Obstructive sleep apnea (adult) (pediatric): Secondary | ICD-10-CM | POA: Diagnosis not present

## 2017-01-02 ENCOUNTER — Encounter: Payer: Self-pay | Admitting: Family Medicine

## 2017-01-02 DIAGNOSIS — G4733 Obstructive sleep apnea (adult) (pediatric): Secondary | ICD-10-CM | POA: Insufficient documentation

## 2017-01-02 DIAGNOSIS — Z9989 Dependence on other enabling machines and devices: Secondary | ICD-10-CM

## 2017-01-12 ENCOUNTER — Ambulatory Visit (INDEPENDENT_AMBULATORY_CARE_PROVIDER_SITE_OTHER): Payer: Medicare HMO | Admitting: Family Medicine

## 2017-01-12 ENCOUNTER — Encounter: Payer: Self-pay | Admitting: Family Medicine

## 2017-01-12 VITALS — BP 128/78 | HR 68 | Resp 16 | Ht 67.0 in | Wt 199.0 lb

## 2017-01-12 DIAGNOSIS — F419 Anxiety disorder, unspecified: Secondary | ICD-10-CM | POA: Diagnosis not present

## 2017-01-12 DIAGNOSIS — E669 Obesity, unspecified: Secondary | ICD-10-CM | POA: Diagnosis not present

## 2017-01-12 DIAGNOSIS — I10 Essential (primary) hypertension: Secondary | ICD-10-CM | POA: Diagnosis not present

## 2017-01-12 DIAGNOSIS — E119 Type 2 diabetes mellitus without complications: Secondary | ICD-10-CM

## 2017-01-12 DIAGNOSIS — M1612 Unilateral primary osteoarthritis, left hip: Secondary | ICD-10-CM | POA: Insufficient documentation

## 2017-01-12 DIAGNOSIS — G4733 Obstructive sleep apnea (adult) (pediatric): Secondary | ICD-10-CM | POA: Diagnosis not present

## 2017-01-12 DIAGNOSIS — R69 Illness, unspecified: Secondary | ICD-10-CM | POA: Diagnosis not present

## 2017-01-12 DIAGNOSIS — Z9989 Dependence on other enabling machines and devices: Secondary | ICD-10-CM | POA: Diagnosis not present

## 2017-01-12 NOTE — Patient Instructions (Signed)
Increase Tylenol to two extra-strength tablets (1000 mg) twice daily.  Call if pain persists. See chiropractor about your back, call if you would like referral to physical therapist.

## 2017-01-12 NOTE — Progress Notes (Signed)
Date:  01/12/2017   Name:  Keanu Frickey   DOB:  11/18/1940   MRN:  [id number]  PCP:  Adline Potter, MD    Chief Complaint: Groin Pain (Concerned she needs Dexa Scan as she has pain when she is lying down. 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 Pain gets to a 7 when she feels it and it is random maybe once every few weeks. )   History of Present Illness:  This is a 76 y.o. female seen for six month f/u. T2DM diet controlled last a1c 6.1% in Feb. Had to switch valsartan to irbesartan due to recall. Anxiety well controlled on Wellbutrin. C/o intermittent L hip pain at night and on rising, improves with activity, known lumbar OA. Has seen both PT and chiro in past for sciatica, prefers chiro. Asks about tramadol prn for pain, taking Tylenol 650 mg bid. Declines flu imm as says allergic, due for Pneumovax 06/2017.  Review of Systems:  Review of Systems  Constitutional: Negative for chills and fever.  HENT: Negative for trouble swallowing.   Respiratory: Negative for cough and shortness of breath.   Cardiovascular: Negative for chest pain and leg swelling.  Gastrointestinal: Negative for abdominal pain, constipation and diarrhea.  Endocrine: Negative for polydipsia and polyuria.  Genitourinary: Negative for difficulty urinating.  Neurological: Negative for syncope and light-headedness.    Patient Active Problem List   Diagnosis Date Noted  . Osteoarthritis of left hip 01/12/2017  . OSA on CPAP 01/02/2017  . Controlled type 2 diabetes mellitus without complication, without long-term current use of insulin ([place]) 06/24/2016  . Hypertension 06/24/2016  . Degenerative disc disease, lumbar 06/24/2016  . Hyperlipidemia 06/24/2016  . Anxiety disorder 06/24/2016  . Obesity (BMI 30.0-34.9) 06/24/2016  . Vitamin D deficiency 06/24/2016  . History of shingles 06/24/2016  . GERD (gastroesophageal reflux disease) 06/24/2016  . FH: heart disease 06/24/2016  . Allergic rhinitis  06/24/2016    Prior to Admission medications   Medication Sig Start Date End Date Taking? Authorizing Provider  acetaminophen (TYLENOL) 500 MG tablet Take 2 tablets (1,000 mg total) by mouth 2 (two) times daily. 12/20/16  Yes Lovene Maret, Gwyndolyn Saxon, MD  amLODipine (NORVASC) 5 MG tablet Take 1 tablet (5 mg total) by mouth daily. 12/27/16  Yes Valory Wetherby, Gwyndolyn Saxon, MD  buPROPion (WELLBUTRIN XL) 150 MG 24 hr tablet Take 1 tablet (150 mg total) by mouth daily. 07/21/16  Yes Johnnie Goynes, Gwyndolyn Saxon, MD  glucose blood test strip Use as instructed 08/30/16  Yes Kvon Mcilhenny, Gwyndolyn Saxon, MD  irbesartan (AVAPRO) 300 MG tablet Take 1 tablet (300 mg total) by mouth daily. 12/26/16  Yes Andriel Omalley, Gwyndolyn Saxon, MD  Lancets [facility] ULTRASOFT) lancets Use as instructed to check Blood Sugar daily 08/30/16  Yes Nolie Bignell, Gwyndolyn Saxon, MD  methylcellulose (CITRUCEL) oral powder Take 1 packet by mouth 2 (two) times daily as needed. 07/29/16  Yes Avital Dancy, Gwyndolyn Saxon, MD    Allergies  Allergen Reactions  . Influenza Vaccines Rash    Past Surgical History:  Procedure Laterality Date  . ABDOMINAL HYSTERECTOMY    . BACK SURGERY     mulitple  . BILATERAL CARPAL TUNNEL RELEASE    . FOOT SURGERY      Social History  Substance Use Topics  . Smoking status: Never Smoker  . Smokeless tobacco: Never Used  . Alcohol use No    Family History  Problem Relation Age of Onset  . Diabetes Mother   . Stroke Mother   . Heart disease Sister   .  Stroke Brother     Medication list has been reviewed and updated.  Physical Examination: BP 128/78   Pulse 68   Resp 16   Ht 5\' 7"  (1.702 m)   Wt 199 lb (90.3 kg)   SpO2 99%   BMI 31.17 kg/m   Physical Exam  Constitutional: She appears well-developed and well-nourished.  Cardiovascular: Normal rate, regular rhythm and normal heart sounds.   Pulmonary/Chest: Effort normal and breath sounds normal.  Musculoskeletal: She exhibits no edema.  Mild pain with L bent knee flexion and hip rotation B SLR negative, gait  normal  Neurological: She is alert.  Skin: Skin is warm and dry.  Psychiatric: She has a normal mood and affect. Her behavior is normal.  Nursing note and vitals reviewed.   Assessment and Plan:  1. Controlled type 2 diabetes mellitus without complication, without long-term current use of insulin (HCC) Unclear control on diet only, BGs running higher at home (160s) - HgB A1c - Urine Microalbumin w/creat. ratio - Lipid Profile  2. Primary osteoarthritis of left hip Increase Tylenol to 1000 mg bid, consider tramadol prn only if pain persists, offered PT referral, wants to try chiro first  3. Essential hypertension Well controlled on Avapro/amlodipine  4. Anxiety disorder, unspecified type Well controlled on Wellbutrin XL  5. OSA on CPAP Sleep medicine following  6. Obesity (BMI 30.0-34.9) Weight down 3#, exercise/weight loss discussed  Return in about 3 months (around 04/13/2017).  Satira Anis. [facility]  01/12/2017

## 2017-01-13 ENCOUNTER — Other Ambulatory Visit: Payer: Self-pay | Admitting: Family Medicine

## 2017-01-13 LAB — HEMOGLOBIN A1C
Est. average glucose Bld gHb Est-mCnc: 137 mg/dL
Hgb A1c MFr Bld: 6.4 % — ABNORMAL HIGH (ref 4.8–5.6)

## 2017-01-13 LAB — LIPID PANEL
CHOLESTEROL TOTAL: 307 mg/dL — AB (ref 100–199)
Chol/HDL Ratio: 4.7 ratio — ABNORMAL HIGH (ref 0.0–4.4)
HDL: 65 mg/dL (ref >39)
LDL CALC: 187 mg/dL — AB (ref 0–99)
TRIGLYCERIDES: 277 mg/dL — AB (ref 0–149)
VLDL Cholesterol Cal: 55 mg/dL — ABNORMAL HIGH (ref 5–40)

## 2017-01-13 LAB — MICROALBUMIN / CREATININE URINE RATIO
CREATININE, UR: 79.7 mg/dL
Microalb/Creat Ratio: <3.8 mg/g creat (ref 0.0–30.0)
Microalbumin, Urine: <3 ug/mL

## 2017-01-13 MED ORDER — ATORVASTATIN CALCIUM 40 MG PO TABS: 40.0000 mg | ORAL_TABLET | Freq: Every day | ORAL | 2 refills | Status: DC

## 2017-01-19 ENCOUNTER — Other Ambulatory Visit: Payer: Self-pay | Admitting: Family Medicine

## 2017-01-25 ENCOUNTER — Other Ambulatory Visit: Payer: Self-pay | Admitting: Family Medicine

## 2017-01-25 MED ORDER — IRBESARTAN 300 MG PO TABS: 300.0000 mg | ORAL_TABLET | Freq: Every day | ORAL | 3 refills | Status: DC

## 2017-01-25 MED ORDER — AMLODIPINE BESYLATE 5 MG PO TABS: 5.0000 mg | ORAL_TABLET | Freq: Every day | ORAL | 3 refills | Status: DC

## 2017-01-30 DIAGNOSIS — G4733 Obstructive sleep apnea (adult) (pediatric): Secondary | ICD-10-CM | POA: Diagnosis not present

## 2017-01-31 ENCOUNTER — Ambulatory Visit: Payer: Medicare HMO | Admitting: Family Medicine

## 2017-02-02 DIAGNOSIS — R69 Illness, unspecified: Secondary | ICD-10-CM | POA: Diagnosis not present

## 2017-02-02 DIAGNOSIS — E119 Type 2 diabetes mellitus without complications: Secondary | ICD-10-CM | POA: Diagnosis not present

## 2017-02-02 DIAGNOSIS — K219 Gastro-esophageal reflux disease without esophagitis: Secondary | ICD-10-CM | POA: Diagnosis not present

## 2017-02-02 DIAGNOSIS — Z6832 Body mass index (BMI) 32.0-32.9, adult: Secondary | ICD-10-CM | POA: Diagnosis not present

## 2017-02-02 DIAGNOSIS — Z0000 Encounter for general adult medical examination without abnormal findings: Secondary | ICD-10-CM | POA: Diagnosis not present

## 2017-02-02 DIAGNOSIS — I10 Essential (primary) hypertension: Secondary | ICD-10-CM | POA: Diagnosis not present

## 2017-02-02 DIAGNOSIS — E669 Obesity, unspecified: Secondary | ICD-10-CM | POA: Diagnosis not present

## 2017-02-02 DIAGNOSIS — G4730 Sleep apnea, unspecified: Secondary | ICD-10-CM | POA: Diagnosis not present

## 2017-02-02 DIAGNOSIS — E785 Hyperlipidemia, unspecified: Secondary | ICD-10-CM | POA: Diagnosis not present

## 2017-02-02 DIAGNOSIS — M5136 Other intervertebral disc degeneration, lumbar region: Secondary | ICD-10-CM | POA: Diagnosis not present

## 2017-02-06 ENCOUNTER — Ambulatory Visit (INDEPENDENT_AMBULATORY_CARE_PROVIDER_SITE_OTHER): Payer: Medicare HMO | Admitting: Family Medicine

## 2017-02-06 ENCOUNTER — Encounter: Payer: Self-pay | Admitting: Family Medicine

## 2017-02-06 VITALS — BP 116/78 | HR 68 | Resp 16 | Ht 67.0 in | Wt 199.6 lb

## 2017-02-06 DIAGNOSIS — E119 Type 2 diabetes mellitus without complications: Secondary | ICD-10-CM | POA: Diagnosis not present

## 2017-02-06 DIAGNOSIS — E669 Obesity, unspecified: Secondary | ICD-10-CM | POA: Diagnosis not present

## 2017-02-06 DIAGNOSIS — L304 Erythema intertrigo: Secondary | ICD-10-CM

## 2017-02-06 DIAGNOSIS — R69 Illness, unspecified: Secondary | ICD-10-CM | POA: Diagnosis not present

## 2017-02-06 DIAGNOSIS — E785 Hyperlipidemia, unspecified: Secondary | ICD-10-CM | POA: Diagnosis not present

## 2017-02-06 DIAGNOSIS — F419 Anxiety disorder, unspecified: Secondary | ICD-10-CM | POA: Diagnosis not present

## 2017-02-06 DIAGNOSIS — K5901 Slow transit constipation: Secondary | ICD-10-CM | POA: Diagnosis not present

## 2017-02-06 DIAGNOSIS — I10 Essential (primary) hypertension: Secondary | ICD-10-CM | POA: Diagnosis not present

## 2017-02-06 MED ORDER — ECONAZOLE NITRATE 1 % EX CREA: TOPICAL_CREAM | Freq: Two times a day (BID) | CUTANEOUS | 0 refills | Status: DC

## 2017-02-06 NOTE — Patient Instructions (Signed)
Intertrigo Intertrigo is skin irritation or inflammation (dermatitis) that occurs when folds of skin rub together. The irritation can cause a rash and make skin raw and itchy. This condition most commonly occurs in the skin folds of these areas:  Toes.  Armpits.  Groin.  Belly.  Breasts.  Buttocks.  Intertrigo is not passed from person to person (is not contagious). What are the causes? This condition is caused by heat, moisture, friction, and lack of air circulation. The condition can be made worse by:  Sweat.  Bacteria or a fungus, such as yeast.  What increases the risk? This condition is more likely to occur if you have moisture in your skin folds. It is also more likely to develop in people who:  Have diabetes.  Are overweight.  Are on bed rest.  Live in a warm and moist climate.  Wear splints, braces, or other medical devices.  Are not able to control their bowels or bladder (have incontinence).  What are the signs or symptoms? Symptoms of this condition include:  A pink or red skin rash.  Brown patches on the skin.  Raw or scaly skin.  Itchiness.  A burning feeling.  Bleeding.  Leaking fluid.  A bad smell.  How is this diagnosed? This condition is diagnosed with a medical history and physical exam. You may also have a skin swab to test for bacteria or a fungus, such as yeast. How is this treated? Treatment may include:  Cleaning and drying your skin.  An oral antibiotic medicine or antibiotic skin cream for a bacterial infection.  Antifungal cream or pills for an infection that was caused by a fungus, such as yeast.  Steroid ointment to relieve itchiness and irritation.  Follow these instructions at home:  Keep the affected area clean and dry.  Do not scratch your skin.  Stay in a cool environment as much as possible. Use an air conditioner or fan, if available.  Apply over-the-counter and prescription medicines only as told by your  health care provider.  If you were prescribed an antibiotic medicine, use it as told by your health care provider. Do not stop using the antibiotic even if your condition improves.  Keep all follow-up visits as told by your health care provider. This is important. How is this prevented?  Maintain a healthy weight.  Take care of your feet, especially if you have diabetes. Foot care includes: ? Wearing shoes that fit well. ? Keeping your feet dry. ? Wearing clean, breathable socks.  Protect the skin around your groin and buttocks, especially if you have incontinence. Skin protection includes: ? Following a regular cleaning routine. ? Using moisturizers and skin protectants. ? Changing protection pads frequently.  Do not wear tight clothes. Wear clothes that are loose and absorbent. Wear clothes that are made of cotton.  Wear a bra that gives good support, if needed.  Shower and dry yourself thoroughly after activity. Use a hair dryer on a cool setting to dry between skin folds, especially after you bathe.  If you have diabetes, keep your blood sugar under control. Contact a health care provider if:  Your symptoms do not improve with treatment.  Your symptoms get worse or they spread.  You notice increased redness and warmth.  You have a fever. This information is not intended to replace advice given to you by your health care provider. Make sure you discuss any questions you have with your health care provider. Document Released: 04/25/2005 Document Revised: 10/01/2015   Document Reviewed: 10/27/2014 Elsevier Interactive Patient Education  2018 [employer]  

## 2017-02-06 NOTE — Progress Notes (Signed)
Date:  02/06/2017   Name:  Molly Ruiz   DOB:  March 11, 1941   MRN:  [id number]  PCP:  Molly Potter, MD    Chief Complaint: Rash (Right Hip-started in vaginal area and she used vaginal cream to help stop itching and it spread up right groin area. )   History of Present Illness:  This is a 76 y.o. female seen for one month f/u. Reports vaginal yeast sxs which resolved with antifungal but now pruritic rash L groin. A1c 6% last visit, anxiety well controlled on Wellbutrin XR. C/o constipation, using fiber laxative daily and Senokot prn.  Review of Systems:  Review of Systems  Constitutional: Negative for chills and fever.  Respiratory: Negative for cough and shortness of breath.   Cardiovascular: Negative for chest pain and leg swelling.  Genitourinary: Negative for pelvic pain and vaginal discharge.  Neurological: Negative for syncope and light-headedness.    Patient Active Problem List   Diagnosis Date Noted  . Osteoarthritis of left hip 01/12/2017  . OSA on CPAP 01/02/2017  . Controlled type 2 diabetes mellitus without complication, without long-term current use of insulin (Molly Ruiz) 06/24/2016  . Hypertension 06/24/2016  . Degenerative disc disease, lumbar 06/24/2016  . Hyperlipidemia 06/24/2016  . Anxiety disorder 06/24/2016  . Obesity (BMI 30.0-34.9) 06/24/2016  . Vitamin D deficiency 06/24/2016  . History of shingles 06/24/2016  . GERD (gastroesophageal reflux disease) 06/24/2016  . FH: heart disease 06/24/2016  . Allergic rhinitis 06/24/2016    Prior to Admission medications   Medication Sig Start Date End Date Taking? Authorizing Provider  acetaminophen (TYLENOL) 500 MG tablet Take 2 tablets (1,000 mg total) by mouth 2 (two) times daily. 12/20/16  Yes Molly Ruiz, Molly Saxon, MD  amLODipine (NORVASC) 5 MG tablet Take 1 tablet (5 mg total) by mouth daily. 01/25/17  Yes Molly Ruiz, Molly Saxon, MD  buPROPion (WELLBUTRIN XL) 150 MG 24 hr tablet Take 1 tablet (150 mg total) by mouth daily.  07/21/16  Yes Molly Ruiz, Molly Saxon, MD  glucose blood test strip Use as instructed 08/30/16  Yes Molly Ruiz, Molly Saxon, MD  irbesartan (AVAPRO) 300 MG tablet Take 1 tablet (300 mg total) by mouth daily. 01/25/17  Yes Molly Ruiz, Molly Saxon, MD  Lancets [facility] ULTRASOFT) lancets Use as instructed to check Blood Sugar daily 08/30/16  Yes Molly Ruiz, Molly Saxon, MD  methylcellulose (CITRUCEL) oral powder Take 1 packet by mouth 2 (two) times daily as needed. 07/29/16  Yes Molly Ruiz, Molly Saxon, MD  econazole nitrate 1 % cream Apply topically 2 (two) times daily. 02/06/17   Molly Potter, MD    Allergies  Allergen Reactions  . Influenza Vaccines Rash    Past Surgical History:  Procedure Laterality Date  . ABDOMINAL HYSTERECTOMY    . BACK SURGERY     mulitple  . BILATERAL CARPAL TUNNEL RELEASE    . FOOT SURGERY      Social History  Substance Use Topics  . Smoking status: Never Smoker  . Smokeless tobacco: Never Used  . Alcohol use No    Family History  Problem Relation Age of Onset  . Diabetes Mother   . Stroke Mother   . Heart disease Sister   . Stroke Brother     Medication list has been reviewed and updated.  Physical Examination: BP 116/78   Pulse 68   Resp 16   Ht 5\' 7"  (1.702 m)   Wt 199 lb 9.6 oz (90.5 kg)   SpO2 98%   BMI 31.26 kg/m   Physical Exam  Constitutional: She appears well-developed  and well-nourished.  Cardiovascular: Normal rate, regular rhythm and normal heart sounds.   Pulmonary/Chest: Effort normal and breath sounds normal.  Musculoskeletal: She exhibits no edema.  Neurological: She is alert.  Skin: Skin is warm and dry.  Mild erythematous moist rash L groin  Psychiatric: She has a normal mood and affect. Her behavior is normal.  Nursing note and vitals reviewed.   Assessment and Plan:  1. Controlled type 2 diabetes mellitus without complication, without long-term current use of insulin (HCC) Well controlled on diet  2. Intertrigo Econazole cream bid  3. Essential  hypertension Well controlled on Avapro/amlodipine  4. Hyperlipidemia, unspecified hyperlipidemia type Worse last visit, intolerant statins, will follow  5. Anxiety disorder, unspecified type Adequate control on Wellbutrin XL  6. Obesity (BMI 30.0-34.9) Weight stable  7. Slow transit constipation Discussed fluids/fiber/exercise, cont current regimen  Return if symptoms worsen or fail to improve.  Molly Ruiz. Molly Ruiz  02/06/2017

## 2017-02-15 DIAGNOSIS — G4733 Obstructive sleep apnea (adult) (pediatric): Secondary | ICD-10-CM | POA: Diagnosis not present

## 2017-03-18 DIAGNOSIS — G4733 Obstructive sleep apnea (adult) (pediatric): Secondary | ICD-10-CM | POA: Diagnosis not present

## 2017-04-17 ENCOUNTER — Ambulatory Visit: Payer: Medicare HMO | Admitting: Family Medicine

## 2017-04-17 DIAGNOSIS — G4733 Obstructive sleep apnea (adult) (pediatric): Secondary | ICD-10-CM | POA: Diagnosis not present

## 2017-05-18 DIAGNOSIS — G4733 Obstructive sleep apnea (adult) (pediatric): Secondary | ICD-10-CM | POA: Diagnosis not present

## 2017-05-19 ENCOUNTER — Encounter: Payer: Self-pay | Admitting: Family Medicine

## 2017-05-19 ENCOUNTER — Ambulatory Visit (INDEPENDENT_AMBULATORY_CARE_PROVIDER_SITE_OTHER): Payer: Medicare HMO | Admitting: Family Medicine

## 2017-05-19 VITALS — BP 138/64 | HR 82 | Ht 67.0 in | Wt 208.0 lb

## 2017-05-19 DIAGNOSIS — R69 Illness, unspecified: Secondary | ICD-10-CM | POA: Diagnosis not present

## 2017-05-19 DIAGNOSIS — E559 Vitamin D deficiency, unspecified: Secondary | ICD-10-CM

## 2017-05-19 DIAGNOSIS — K219 Gastro-esophageal reflux disease without esophagitis: Secondary | ICD-10-CM | POA: Diagnosis not present

## 2017-05-19 DIAGNOSIS — E669 Obesity, unspecified: Secondary | ICD-10-CM | POA: Diagnosis not present

## 2017-05-19 DIAGNOSIS — E785 Hyperlipidemia, unspecified: Secondary | ICD-10-CM

## 2017-05-19 DIAGNOSIS — E119 Type 2 diabetes mellitus without complications: Secondary | ICD-10-CM

## 2017-05-19 DIAGNOSIS — I10 Essential (primary) hypertension: Secondary | ICD-10-CM

## 2017-05-19 DIAGNOSIS — F419 Anxiety disorder, unspecified: Secondary | ICD-10-CM

## 2017-05-19 DIAGNOSIS — E66811 Obesity, class 1: Secondary | ICD-10-CM

## 2017-05-19 DIAGNOSIS — K5901 Slow transit constipation: Secondary | ICD-10-CM

## 2017-05-19 MED ORDER — PRAVASTATIN SODIUM 40 MG PO TABS: 40.0000 mg | ORAL_TABLET | Freq: Every day | ORAL | 2 refills | Status: DC

## 2017-05-19 MED ORDER — PANTOPRAZOLE SODIUM 40 MG PO TBEC
40.0000 mg | DELAYED_RELEASE_TABLET | Freq: Every day | ORAL | 0 refills | Status: DC
Start: 2017-05-19 — End: 2017-08-17

## 2017-05-19 NOTE — Progress Notes (Signed)
Date:  05/19/2017   Name:  Molly Ruiz   DOB:  Sep 12, 1940   MRN:  [id number]  PCP:  Adline Potter, MD    Chief Complaint: Follow-up (wants to discuss replacement for lipitor- not taking the med now)   History of Present Illness:  This is a 77 y.o. female seen for four month f/u. C/o increased GERD sxs past month, was on Protonix before which worked well. Intolerant Crestor and Lipitor in past, has not tried Pravachol. T2DM diet controlled, home BGs good despite recent weight gain. Anxiety adequately controlled on Wellbutrin XL, constipation resolved on Citrucel. Allergic flu vaccine, has not seen optho past year.  Review of Systems:  Review of Systems  Constitutional: Negative for chills and fever.  Respiratory: Negative for cough and shortness of breath.   Cardiovascular: Negative for chest pain and leg swelling.  Endocrine: Negative for polydipsia and polyuria.  Genitourinary: Negative for difficulty urinating.  Neurological: Negative for syncope and light-headedness.    Patient Active Problem List   Diagnosis Date Noted  . 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 Osteoarthritis of left hip 01/12/2017  . OSA on CPAP 01/02/2017  . Controlled type 2 diabetes mellitus without complication, without long-term current use of insulin ([place]) 06/24/2016  . Hypertension 06/24/2016  . Degenerative disc disease, lumbar 06/24/2016  . Hyperlipidemia 06/24/2016  . Anxiety disorder 06/24/2016  . Obesity (BMI 30.0-34.9) 06/24/2016  . Vitamin D deficiency 06/24/2016  . History of shingles 06/24/2016  . GERD (gastroesophageal reflux disease) 06/24/2016  . FH: heart disease 06/24/2016  . Allergic rhinitis 06/24/2016    Prior to Admission medications   Medication Sig Start Date End Date Taking? Authorizing Provider  acetaminophen (TYLENOL) 500 MG tablet Take 2 tablets (1,000 mg total) by mouth 2 (two) times daily. 12/20/16  Yes Autumne Kallio, Gwyndolyn Saxon, MD  amLODipine (NORVASC) 5 MG tablet Take 1  tablet (5 mg total) by mouth daily. 01/25/17  Yes Caydee Talkington, Gwyndolyn Saxon, MD  buPROPion (WELLBUTRIN XL) 150 MG 24 hr tablet Take 1 tablet (150 mg total) by mouth daily. 07/21/16  Yes Jihan Mellette, Gwyndolyn Saxon, MD  glucose blood test strip Use as instructed 08/30/16  Yes Kynslei Art, Gwyndolyn Saxon, MD  irbesartan (AVAPRO) 300 MG tablet Take 1 tablet (300 mg total) by mouth daily. 01/25/17  Yes Tymia Streb, Gwyndolyn Saxon, MD  Lancets [facility] ULTRASOFT) lancets Use as instructed to check Blood Sugar daily 08/30/16  Yes Wanda Cellucci, Gwyndolyn Saxon, MD  methylcellulose (CITRUCEL) oral powder Take 1 packet by mouth 2 (two) times daily as needed. 07/29/16  Yes Ivaan Liddy, Gwyndolyn Saxon, MD  pantoprazole (PROTONIX) 40 MG tablet Take 1 tablet (40 mg total) by mouth daily. 05/19/17   Alanzo Lamb, Gwyndolyn Saxon, MD  pravastatin (PRAVACHOL) 40 MG tablet Take 1 tablet (40 mg total) by mouth daily. 05/19/17   Adline Potter, MD    Allergies  Allergen Reactions  . Influenza Vaccines Rash    Past Surgical History:  Procedure Laterality Date  . ABDOMINAL HYSTERECTOMY    . BACK SURGERY     mulitple  . BILATERAL CARPAL TUNNEL RELEASE    . FOOT SURGERY      Social History   Tobacco Use  . Smoking status: Never Smoker  . Smokeless tobacco: Never Used  Substance Use Topics  . Alcohol use: No  . Drug use: No    Family History  Problem Relation Age of Onset  . Diabetes Mother   . Stroke Mother   . Heart disease Sister   . Stroke Brother     Medication list has  been reviewed and updated.  Physical Examination: BP 138/64   Pulse 82   Ht 5\' 7"  (1.702 m)   Wt 208 lb (94.3 kg)   SpO2 99%   BMI 32.58 kg/m   Physical Exam  Constitutional: She appears well-developed and well-nourished.  Cardiovascular: Normal rate, regular rhythm and normal heart sounds.  Pulmonary/Chest: Effort normal and breath sounds normal.  Musculoskeletal: She exhibits no edema.  Neurological: She is alert.  Skin: Skin is warm and dry.  Psychiatric: She has a normal mood and affect. Her behavior is  normal.  Nursing note and vitals reviewed.   Assessment and Plan:  1. Controlled type 2 diabetes mellitus without complication, without long-term current use of insulin (HCC) Diet controlled - HgB A1c - Urine Microalbumin w/creat. ratio - TSH - Ambulatory referral to Ophthalmology  2. Essential hypertension Well controlled on Avapro/amlodipine - Comprehensive Metabolic Panel (CMET) - CBC  3. Gastroesophageal reflux disease, esophagitis presence not specified Protonix 40 mg daily x 2 weeks only, call if sxs worsen/persist  4. Hyperlipidemia, unspecified hyperlipidemia type Trial Pravachol, d/c if side effects develop  5. Anxiety disorder, unspecified type Well controlled on Wellbutrin XL  6. Slow transit constipation Resolved on Citrucel  7. Vitamin D deficiency Per history, not on current supplement - Vitamin D (25 hydroxy)  8. Obesity (BMI 30.0-34.9) Weight up 9#, discussed importance of exercise/weight control for DM/HTN  Return in about 3 months (around 08/17/2017).  Satira Anis. [facility]  05/19/2017

## 2017-05-20 LAB — MICROALBUMIN / CREATININE URINE RATIO
Creatinine, Urine: 31.3 mg/dL
Microalbumin, Urine: <3 ug/mL

## 2017-05-20 LAB — CBC
HEMATOCRIT: 38.2 % (ref 34.0–46.6)
HEMOGLOBIN: 12.9 g/dL (ref 11.1–15.9)
MCH: 29.6 pg (ref 26.6–33.0)
MCHC: 33.8 g/dL (ref 31.5–35.7)
MCV: 88 fL (ref 79–97)
Platelets: 215 10*3/uL (ref 150–379)
RBC: 4.36 x10E6/uL (ref 3.77–5.28)
RDW: 14.4 % (ref 12.3–15.4)
WBC: 4.2 10*3/uL (ref 3.4–10.8)

## 2017-05-20 LAB — COMPREHENSIVE METABOLIC PANEL
ALT: 11 IU/L (ref 0–32)
AST: 16 IU/L (ref 0–40)
Albumin/Globulin Ratio: 1.4 (ref 1.2–2.2)
Albumin: 4.3 g/dL (ref 3.5–4.8)
Alkaline Phosphatase: 106 IU/L (ref 39–117)
BUN/Creatinine Ratio: 14 (ref 12–28)
BUN: 13 mg/dL (ref 8–27)
Bilirubin Total: 0.4 mg/dL (ref 0.0–1.2)
CALCIUM: 10 mg/dL (ref 8.7–10.3)
CO2: 25 mmol/L (ref 20–29)
Chloride: 104 mmol/L (ref 96–106)
Creatinine, Ser: 0.96 mg/dL (ref 0.57–1.00)
GFR, EST AFRICAN AMERICAN: 66 mL/min/{1.73_m2} (ref >59)
GFR, EST NON AFRICAN AMERICAN: 58 mL/min/{1.73_m2} — AB (ref >59)
GLUCOSE: 140 mg/dL — AB (ref 65–99)
Globulin, Total: 3.1 g/dL (ref 1.5–4.5)
Potassium: 4.7 mmol/L (ref 3.5–5.2)
Sodium: 141 mmol/L (ref 134–144)
Total Protein: 7.4 g/dL (ref 6.0–8.5)

## 2017-05-20 LAB — TSH: TSH: 1.48 u[IU]/mL (ref 0.450–4.500)

## 2017-05-20 LAB — HEMOGLOBIN A1C
Est. average glucose Bld gHb Est-mCnc: 140 mg/dL
Hgb A1c MFr Bld: 6.5 % — ABNORMAL HIGH (ref 4.8–5.6)

## 2017-05-20 LAB — VITAMIN D 25 HYDROXY (VIT D DEFICIENCY, FRACTURES): Vit D, 25-Hydroxy: 19.2 ng/mL — ABNORMAL LOW (ref 30.0–100.0)

## 2017-05-22 ENCOUNTER — Other Ambulatory Visit: Payer: Self-pay | Admitting: Family Medicine

## 2017-05-22 ENCOUNTER — Ambulatory Visit: Payer: Medicare HMO | Admitting: Family Medicine

## 2017-05-22 MED ORDER — VITAMIN D3 50 MCG (2000 UT) PO CAPS: 2000.0000 [IU] | ORAL_CAPSULE | Freq: Every day | ORAL | Status: DC

## 2017-06-18 DIAGNOSIS — G4733 Obstructive sleep apnea (adult) (pediatric): Secondary | ICD-10-CM | POA: Diagnosis not present

## 2017-07-16 DIAGNOSIS — G4733 Obstructive sleep apnea (adult) (pediatric): Secondary | ICD-10-CM | POA: Diagnosis not present

## 2017-07-17 DIAGNOSIS — E119 Type 2 diabetes mellitus without complications: Secondary | ICD-10-CM | POA: Diagnosis not present

## 2017-07-17 DIAGNOSIS — M79674 Pain in right toe(s): Secondary | ICD-10-CM | POA: Diagnosis not present

## 2017-07-17 DIAGNOSIS — B351 Tinea unguium: Secondary | ICD-10-CM | POA: Diagnosis not present

## 2017-07-20 DIAGNOSIS — G4733 Obstructive sleep apnea (adult) (pediatric): Secondary | ICD-10-CM | POA: Diagnosis not present

## 2017-07-28 DIAGNOSIS — E119 Type 2 diabetes mellitus without complications: Secondary | ICD-10-CM | POA: Diagnosis not present

## 2017-08-03 LAB — HM DIABETES EYE EXAM

## 2017-08-16 DIAGNOSIS — G4733 Obstructive sleep apnea (adult) (pediatric): Secondary | ICD-10-CM | POA: Diagnosis not present

## 2017-08-17 ENCOUNTER — Encounter: Payer: Self-pay | Admitting: Family Medicine

## 2017-08-17 ENCOUNTER — Ambulatory Visit (INDEPENDENT_AMBULATORY_CARE_PROVIDER_SITE_OTHER): Payer: Medicare HMO | Admitting: Family Medicine

## 2017-08-17 VITALS — BP 124/80 | HR 70 | Resp 16 | Ht 67.0 in | Wt 209.5 lb

## 2017-08-17 DIAGNOSIS — E559 Vitamin D deficiency, unspecified: Secondary | ICD-10-CM

## 2017-08-17 DIAGNOSIS — M5136 Other intervertebral disc degeneration, lumbar region: Secondary | ICD-10-CM

## 2017-08-17 DIAGNOSIS — E669 Obesity, unspecified: Secondary | ICD-10-CM

## 2017-08-17 DIAGNOSIS — R69 Illness, unspecified: Secondary | ICD-10-CM | POA: Diagnosis not present

## 2017-08-17 DIAGNOSIS — K5901 Slow transit constipation: Secondary | ICD-10-CM

## 2017-08-17 DIAGNOSIS — I10 Essential (primary) hypertension: Secondary | ICD-10-CM

## 2017-08-17 DIAGNOSIS — E785 Hyperlipidemia, unspecified: Secondary | ICD-10-CM

## 2017-08-17 DIAGNOSIS — E119 Type 2 diabetes mellitus without complications: Secondary | ICD-10-CM

## 2017-08-17 DIAGNOSIS — F419 Anxiety disorder, unspecified: Secondary | ICD-10-CM

## 2017-08-17 DIAGNOSIS — K219 Gastro-esophageal reflux disease without esophagitis: Secondary | ICD-10-CM

## 2017-08-17 NOTE — Progress Notes (Addendum)
Date:  08/17/2017   Name:  Molly Ruiz   DOB:  08/06/1940   MRN:  [id number]  PCP:  Adline Potter, MD    Chief Complaint: Diabetes (BS range 120-130) and Hyperlipidemia (Had to stop cholesterol meds due to leg cramps and now is juicing and not taking anything else to help cholesterol. Stopped it about 2.5 months ago )   History of Present Illness:  This is a 77 y.o. female seen for three month f/u. Home BGs well controlled on diet alone. GERD sxs intermittent only s/p PPI course, no longer taking. Unable to tolerate Pravachol due to leg cramps. Anxiety adequately controlled on Wellbutrin. Constipation resolved. On vit D supplement. OA sxs adequately controlled on Tylenol bid.  Review of Systems:  Review of Systems  Constitutional: Negative for chills and fever.  Respiratory: Negative for cough and shortness of breath.   Cardiovascular: Negative for chest pain and leg swelling.  Genitourinary: Negative for difficulty urinating.  Neurological: Negative for syncope and light-headedness.    Patient Active Problem List   Diagnosis Date Noted  . 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 Osteoarthritis of left hip 01/12/2017  . OSA on CPAP 01/02/2017  . Controlled type 2 diabetes mellitus without complication, without long-term current use of insulin ([place]) 06/24/2016  . Hypertension 06/24/2016  . Degenerative disc disease, lumbar 06/24/2016  . Hyperlipidemia 06/24/2016  . Anxiety disorder 06/24/2016  . Obesity (BMI 30.0-34.9) 06/24/2016  . Vitamin D deficiency 06/24/2016  . History of shingles 06/24/2016  . GERD (gastroesophageal reflux disease) 06/24/2016  . FH: heart disease 06/24/2016  . Allergic rhinitis 06/24/2016    Prior to Admission medications   Medication Sig Start Date End Date Taking? Authorizing Provider  acetaminophen (TYLENOL) 500 MG tablet Take 2 tablets (1,000 mg total) by mouth 2 (two) times daily. 12/20/16  Yes Seab Axel, Gwyndolyn Saxon, MD  amLODipine (NORVASC) 5 MG tablet  Take 1 tablet (5 mg total) by mouth daily. 01/25/17  Yes Norie Latendresse, Gwyndolyn Saxon, MD  buPROPion (WELLBUTRIN XL) 150 MG 24 hr tablet Take 1 tablet (150 mg total) by mouth daily. 07/21/16  Yes Amisadai Woodford, Gwyndolyn Saxon, MD  Cholecalciferol (VITAMIN D3) 2000 units capsule Take 1 capsule (2,000 Units total) by mouth daily. 05/22/17  Yes Annamarie Yamaguchi, Gwyndolyn Saxon, MD  glucose blood test strip Use as instructed 08/30/16  Yes Dani Wallner, Gwyndolyn Saxon, MD  irbesartan (AVAPRO) 300 MG tablet Take 1 tablet (300 mg total) by mouth daily. 01/25/17  Yes Lineth Thielke, Gwyndolyn Saxon, MD  Lancets [facility] ULTRASOFT) lancets Use as instructed to check Blood Sugar daily 08/30/16  Yes Jaceyon Strole, Gwyndolyn Saxon, MD    Allergies  Allergen Reactions  . Influenza Vaccines Rash    Past Surgical History:  Procedure Laterality Date  . ABDOMINAL HYSTERECTOMY    . BACK SURGERY     mulitple  . BILATERAL CARPAL TUNNEL RELEASE    . FOOT SURGERY      Social History   Tobacco Use  . Smoking status: Never Smoker  . Smokeless tobacco: Never Used  Substance Use Topics  . Alcohol use: No  . Drug use: No    Family History  Problem Relation Age of Onset  . Diabetes Mother   . Stroke Mother   . Heart disease Sister   . Stroke Brother     Medication list has been reviewed and updated.  Physical Examination: BP 124/80   Pulse 70   Resp 16   Ht 5\' 7"  (1.702 m)   Wt 209 lb 8 oz (95 kg)   SpO2  99%   BMI 32.81 kg/m   Physical Exam  Constitutional: She appears well-developed and well-nourished.  Cardiovascular: Normal rate, regular rhythm and normal heart sounds.  Pulmonary/Chest: Effort normal and breath sounds normal.  Musculoskeletal: She exhibits no edema.  Neurological: She is alert.  Skin: Skin is warm and dry.  Psychiatric: She has a normal mood and affect. Her behavior is normal.  Nursing note and vitals reviewed.   Assessment and Plan:  1. Controlled type 2 diabetes mellitus without complication, without long-term current use of insulin (HCC) Diet  controlled, MCR ok, saw ophtho last month - HgB A1c  2. Essential hypertension Well controlled on Avapro/amlodipine  3. Anxiety disorder, unspecified type Adequately controlled on Wellbutrin XL  4. Gastroesophageal reflux disease, esophagitis presence not specified Intermittent only s/p PPI  5. Hyperlipidemia, unspecified hyperlipidemia type Intolerant statins  6. Slow transit constipation Resolved  7. Degenerative disc disease, lumbar Adequate control on Tylenol bid  8. Vitamin D deficiency On supplement - Vitamin D (25 hydroxy)  9. Obesity (BMI 30.0-34.9) Weight stable, exercise/weight loss discussed  10. HM Pneumovax due next visit  Return in about 6 months (around 02/16/2018).  Satira Anis. [facility]  08/17/2017

## 2017-08-18 LAB — VITAMIN D 25 HYDROXY (VIT D DEFICIENCY, FRACTURES): Vit D, 25-Hydroxy: 25.8 ng/mL — ABNORMAL LOW (ref 30.0–100.0)

## 2017-08-18 LAB — HEMOGLOBIN A1C
Est. average glucose Bld gHb Est-mCnc: 143 mg/dL
HEMOGLOBIN A1C: 6.6 % — AB (ref 4.8–5.6)

## 2017-08-23 ENCOUNTER — Ambulatory Visit (INDEPENDENT_AMBULATORY_CARE_PROVIDER_SITE_OTHER): Payer: Medicare HMO

## 2017-08-23 DIAGNOSIS — Z23 Encounter for immunization: Secondary | ICD-10-CM | POA: Diagnosis not present

## 2017-08-23 NOTE — Patient Instructions (Signed)
Pneumococcal Conjugate Vaccine (PCV13) What You Need to Know 1. Why get vaccinated? Vaccination can protect both children and adults from pneumococcal disease. Pneumococcal disease is caused by bacteria that can spread from person to person through close contact. It can cause ear infections, and it can also lead to more serious infections of the:  Lungs (pneumonia),  Blood (bacteremia), and  Covering of the brain and spinal cord (meningitis).  Pneumococcal pneumonia is most common among adults. Pneumococcal meningitis can cause deafness and brain damage, and it kills about 1 child in 10 who get it. Anyone can get pneumococcal disease, but children under 2 years of age and adults 65 years and older, people with certain medical conditions, and cigarette smokers are at the highest risk. Before there was a vaccine, the [place] saw:  more than 700 cases of meningitis,  about 13,000 blood infections,  about 5 million ear infections, and  about 200 deaths  in children under 5 each year from pneumococcal disease. Since vaccine became available, severe pneumococcal disease in these children has fallen by 88%. About 18,000 older adults die of pneumococcal disease each year in the [place]. Treatment of pneumococcal infections with penicillin and other drugs is not as effective as it used to be, because some strains of the disease have become resistant to these drugs. This makes prevention of the disease, through vaccination, even more important. 2. PCV13 vaccine Pneumococcal conjugate vaccine (called PCV13) protects against 13 types of pneumococcal bacteria. PCV13 is routinely given to children at 2, 4, 6, and 12-15 months of age. It is also recommended for children and adults 2 to 64 years of age with certain health conditions, and for all adults 65 years of age and older. Your doctor can give you details. 3. Some people should not get this vaccine Anyone who has ever had a  life-threatening allergic reaction to a dose of this vaccine, to an earlier pneumococcal vaccine called PCV7, or to any vaccine containing diphtheria toxoid (for example, DTaP), should not get PCV13. Anyone with a severe allergy to any component of PCV13 should not get the vaccine. Tell your doctor if the person being vaccinated has any severe allergies. If the person scheduled for vaccination is not feeling well, your healthcare provider might decide to reschedule the shot on another day. 4. Risks of a vaccine reaction With any medicine, including vaccines, there is a chance of reactions. These are usually mild and go away on their own, but serious reactions are also possible. Problems reported following PCV13 varied by age and dose in the series. The most common problems reported among children were:  About half became drowsy after the shot, had a temporary loss of appetite, or had redness or tenderness where the shot was given.  About 1 out of 3 had swelling where the shot was given.  About 1 out of 3 had a mild fever, and about 1 in 20 had a fever over 102.2F.  Up to about 8 out of 10 became fussy or irritable.  Adults have reported pain, redness, and swelling where the shot was given; also mild fever, fatigue, headache, chills, or muscle pain. Young children who get PCV13 along with inactivated flu vaccine at the same time may be at increased risk for seizures caused by fever. Ask your doctor for more information. Problems that could happen after any vaccine:  People sometimes faint after a medical procedure, including vaccination. Sitting or lying down for about 15 minutes can help prevent   fainting, and injuries caused by a fall. Tell your doctor if you feel dizzy, or have vision changes or ringing in the ears.  Some older children and adults get severe pain in the shoulder and have difficulty moving the arm where a shot was given. This happens very rarely.  Any medication can cause a  severe allergic reaction. Such reactions from a vaccine are very rare, estimated at about 1 in a million doses, and would happen within a few minutes to a few hours after the vaccination. As with any medicine, there is a very small chance of a vaccine causing a serious injury or death. The safety of vaccines is always being monitored. For more information, visit: www.cdc.gov/vaccinesafety/ 5. What if there is a serious reaction? What should I look for? Look for anything that concerns you, such as signs of a severe allergic reaction, very high fever, or unusual behavior. Signs of a severe allergic reaction can include hives, swelling of the face and throat, difficulty breathing, a fast heartbeat, dizziness, and weakness-usually within a few minutes to a few hours after the vaccination. What should I do?  If you think it is a severe allergic reaction or other emergency that can't wait, call 9-1-1 or get the person to the nearest hospital. Otherwise, call your doctor.  Reactions should be reported to the Vaccine Adverse Event Reporting System (VAERS). Your doctor should file this report, or you can do it yourself through the VAERS web site at www.vaers.hhs.gov, or by calling 1-800-822-7967. ? VAERS does not give medical advice. 6. The National Vaccine Injury Compensation Program The National Vaccine Injury Compensation Program (VICP) is a federal program that was created to compensate people who may have been injured by certain vaccines. Persons who believe they may have been injured by a vaccine can learn about the program and about filing a claim by calling 1-800-338-2382 or visiting the VICP website at www.hrsa.gov/vaccinecompensation. There is a time limit to file a claim for compensation. 7. How can I learn more?  Ask your healthcare provider. He or she can give you the vaccine package insert or suggest other sources of information.  Call your local or state health department.  Contact the  Centers for Disease Control and Prevention (CDC): ? Call 1-800-232-4636 (1-800-CDC-INFO) or ? Visit CDC's website at www.cdc.gov/vaccines Vaccine Information Statement, PCV13 Vaccine (03/13/2014) This information is not intended to replace advice given to you by your health care provider. Make sure you discuss any questions you have with your health care provider. Document Released: 02/20/2006 Document Revised: 01/14/2016 Document Reviewed: 01/14/2016 Elsevier Interactive Patient Education  2017 Elsevier Inc.  

## 2017-08-29 ENCOUNTER — Other Ambulatory Visit: Payer: Self-pay | Admitting: Family Medicine

## 2017-08-29 NOTE — Telephone Encounter (Signed)
Requesting refill Wellbutrin

## 2017-09-15 DIAGNOSIS — G4733 Obstructive sleep apnea (adult) (pediatric): Secondary | ICD-10-CM | POA: Diagnosis not present

## 2017-09-21 ENCOUNTER — Telehealth: Payer: Self-pay

## 2017-09-21 NOTE — Telephone Encounter (Signed)
Called to schedule Medicare Annual Wellness Visit with Nurse Health Advisor. If patient returns call, please note: their last AWV was on 09/26/16 please schedule AWV with [facility] any date after Sep 26 2017  Thank you! For any questions please contact: Jill Alexanders [phone]  Skype [email or web address]

## 2017-10-06 NOTE — Telephone Encounter (Signed)
3rd attempt on 5/31

## 2017-10-16 DIAGNOSIS — G4733 Obstructive sleep apnea (adult) (pediatric): Secondary | ICD-10-CM | POA: Diagnosis not present

## 2017-10-25 ENCOUNTER — Encounter: Payer: Self-pay | Admitting: Internal Medicine

## 2017-10-25 ENCOUNTER — Ambulatory Visit (INDEPENDENT_AMBULATORY_CARE_PROVIDER_SITE_OTHER): Payer: Medicare HMO | Admitting: Internal Medicine

## 2017-10-25 VITALS — BP 112/80 | HR 80 | Temp 98.6°F | Resp 16 | Ht 67.0 in | Wt 210.0 lb

## 2017-10-25 DIAGNOSIS — L309 Dermatitis, unspecified: Secondary | ICD-10-CM | POA: Diagnosis not present

## 2017-10-25 DIAGNOSIS — R600 Localized edema: Secondary | ICD-10-CM | POA: Diagnosis not present

## 2017-10-25 MED ORDER — TRIAMCINOLONE ACETONIDE 0.1 % EX CREA: 1.0000 "application " | TOPICAL_CREAM | Freq: Two times a day (BID) | CUTANEOUS | 0 refills | Status: DC

## 2017-10-25 NOTE — Progress Notes (Signed)
Date:  10/25/2017   Name:  Molly Ruiz   DOB:  February 17, 1941   MRN:  [id number]   Chief Complaint: Leg Swelling and Rash (legs) Rash  This is a recurrent problem. The affected locations include the left lower leg. The rash is characterized by dryness, itchiness and scaling. She was exposed to nothing. Pertinent negatives include no cough, fatigue or shortness of breath. Treatments tried: nystatin. The treatment provided no relief.   Leg swelling - Several weeks ago ankles and feet swelled after sitting doing handwork for several days.  She was not eating more salt, had not been on long car or plane rides.  She began elevating and the edema resolved in one day.  It has remained resolved.  She is on amlodipine but started that 9 months ago.   Review of Systems  Constitutional: Negative for chills, fatigue and unexpected weight change.  Respiratory: Negative for cough, chest tightness, shortness of breath and wheezing.   Cardiovascular: Positive for leg swelling. Negative for chest pain and palpitations.  Gastrointestinal: Negative for abdominal pain.  Skin: Positive for rash.  Neurological: Negative for dizziness and headaches.  Hematological: Negative for adenopathy.  Psychiatric/Behavioral: Negative for sleep disturbance.    Patient Active Problem List   Diagnosis Date Noted  . 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 Osteoarthritis of left hip 01/12/2017  . OSA on CPAP 01/02/2017  . Controlled type 2 diabetes mellitus without complication, without long-term current use of insulin ([place]) 06/24/2016  . Hypertension 06/24/2016  . Degenerative disc disease, lumbar 06/24/2016  . Hyperlipidemia 06/24/2016  . Anxiety disorder 06/24/2016  . Obesity (BMI 30.0-34.9) 06/24/2016  . Vitamin D deficiency 06/24/2016  . History of shingles 06/24/2016  . GERD (gastroesophageal reflux disease) 06/24/2016  . FH: heart disease 06/24/2016  . Allergic rhinitis 06/24/2016    Prior to Admission  medications   Medication Sig Start Date End Date Taking? Authorizing Provider  acetaminophen (TYLENOL) 500 MG tablet Take 2 tablets (1,000 mg total) by mouth 2 (two) times daily. 12/20/16  Yes Plonk, Gwyndolyn Saxon, MD  amLODipine (NORVASC) 5 MG tablet Take 1 tablet (5 mg total) by mouth daily. 01/25/17  Yes Plonk, Gwyndolyn Saxon, MD  buPROPion (WELLBUTRIN XL) 150 MG 24 hr tablet TAKE 1 TABLET BY MOUTH ONCE DAILY 08/29/17  Yes Plonk, Gwyndolyn Saxon, MD  Cholecalciferol (VITAMIN D3) 2000 units capsule Take 1 capsule (2,000 Units total) by mouth daily. 05/22/17  Yes Plonk, Gwyndolyn Saxon, MD  glucose blood test strip Use as instructed 08/30/16  Yes Plonk, Gwyndolyn Saxon, MD  irbesartan (AVAPRO) 300 MG tablet Take 1 tablet (300 mg total) by mouth daily. 01/25/17  Yes Plonk, Gwyndolyn Saxon, MD  Lancets [facility] ULTRASOFT) lancets Use as instructed to check Blood Sugar daily 08/30/16  Yes Plonk, Gwyndolyn Saxon, MD    Allergies  Allergen Reactions  . Influenza Vaccines Rash    Past Surgical History:  Procedure Laterality Date  . ABDOMINAL HYSTERECTOMY    . BACK SURGERY     mulitple  . BILATERAL CARPAL TUNNEL RELEASE    . FOOT SURGERY      Social History   Tobacco Use  . Smoking status: Never Smoker  . Smokeless tobacco: Never Used  Substance Use Topics  . Alcohol use: No  . Drug use: No     Medication list has been reviewed and updated.  Current Meds  Medication Sig  . acetaminophen (TYLENOL) 500 MG tablet Take 2 tablets (1,000 mg total) by mouth 2 (two) times daily.  [name] Kitchen amLODipine (NORVASC)  5 MG tablet Take 1 tablet (5 mg total) by mouth daily.  [name] Kitchen buPROPion (WELLBUTRIN XL) 150 MG 24 hr tablet TAKE 1 TABLET BY MOUTH ONCE DAILY  . Cholecalciferol (VITAMIN D3) 2000 units capsule Take 1 capsule (2,000 Units total) by mouth daily.  [name] Kitchen glucose blood test strip Use as instructed  . irbesartan (AVAPRO) 300 MG tablet Take 1 tablet (300 mg total) by mouth daily.  . Lancets (ONETOUCH ULTRASOFT) lancets Use as instructed to check Blood Sugar  daily    PHQ 2/9 Scores 10/25/2017 09/26/2016 09/26/2016 06/24/2016  PHQ - 2 Score 0 0 0 0  PHQ- 9 Score - 1 - -    Physical Exam  Constitutional: She is oriented to person, place, and time. She appears well-developed. No distress.  HENT:  Head: Normocephalic and atraumatic.  Neck: Normal range of motion. Neck supple. Carotid bruit is not present.  Cardiovascular: Normal rate, regular rhythm, normal heart sounds and normal pulses.  Pulmonary/Chest: Effort normal and breath sounds normal. No respiratory distress. She has no wheezes.  Musculoskeletal: Normal range of motion. She exhibits no edema or tenderness.  Lymphadenopathy:    She has no cervical adenopathy.  Neurological: She is alert and oriented to person, place, and time.  Skin: Skin is warm and dry. No rash noted.     Psychiatric: She has a normal mood and affect. Her speech is normal and behavior is normal. Thought content normal.  Nursing note and vitals reviewed.   BP 112/80   Pulse 80   Temp 98.6 F (37 C) (Oral)   Resp 16   Ht 5\' 7"  (1.702 m)   Wt 210 lb (95.3 kg)   SpO2 98%   BMI 32.89 kg/m   Assessment and Plan: 1. Localized edema Resolved - rec sodium restriction, increase water and elevate as needed Consider stopping or reducing amlodipine  2. Eczema, unspecified type - triamcinolone cream (KENALOG) 0.1 %; Apply 1 application topically 2 (two) times daily. For rash on leg  Dispense: 30 g; Refill: 0   Meds ordered this encounter  Medications  . triamcinolone cream (KENALOG) 0.1 %    Sig: Apply 1 application topically 2 (two) times daily. For rash on leg    Dispense:  30 g    Refill:  0    Partially dictated using Editor, commissioning. Any errors are unintentional.  [name], MD [facility]  10/25/2017

## 2017-11-15 DIAGNOSIS — G4733 Obstructive sleep apnea (adult) (pediatric): Secondary | ICD-10-CM | POA: Diagnosis not present

## 2017-11-22 DIAGNOSIS — I10 Essential (primary) hypertension: Secondary | ICD-10-CM | POA: Diagnosis not present

## 2017-11-22 DIAGNOSIS — E1136 Type 2 diabetes mellitus with diabetic cataract: Secondary | ICD-10-CM | POA: Diagnosis not present

## 2017-11-22 DIAGNOSIS — G8929 Other chronic pain: Secondary | ICD-10-CM | POA: Diagnosis not present

## 2017-11-22 DIAGNOSIS — G3184 Mild cognitive impairment, so stated: Secondary | ICD-10-CM | POA: Diagnosis not present

## 2017-11-22 DIAGNOSIS — E669 Obesity, unspecified: Secondary | ICD-10-CM | POA: Diagnosis not present

## 2017-11-22 DIAGNOSIS — E11620 Type 2 diabetes mellitus with diabetic dermatitis: Secondary | ICD-10-CM | POA: Diagnosis not present

## 2017-11-22 DIAGNOSIS — R69 Illness, unspecified: Secondary | ICD-10-CM | POA: Diagnosis not present

## 2017-11-22 DIAGNOSIS — Z8249 Family history of ischemic heart disease and other diseases of the circulatory system: Secondary | ICD-10-CM | POA: Diagnosis not present

## 2017-11-22 DIAGNOSIS — Z6831 Body mass index (BMI) 31.0-31.9, adult: Secondary | ICD-10-CM | POA: Diagnosis not present

## 2017-11-22 DIAGNOSIS — Z833 Family history of diabetes mellitus: Secondary | ICD-10-CM | POA: Diagnosis not present

## 2017-12-18 ENCOUNTER — Encounter: Payer: Self-pay | Admitting: Internal Medicine

## 2017-12-18 NOTE — Telephone Encounter (Signed)
Lm and sent letter

## 2017-12-22 DIAGNOSIS — G4733 Obstructive sleep apnea (adult) (pediatric): Secondary | ICD-10-CM | POA: Diagnosis not present

## 2018-02-18 ENCOUNTER — Other Ambulatory Visit: Payer: Self-pay | Admitting: Family Medicine

## 2018-03-06 ENCOUNTER — Other Ambulatory Visit: Payer: Self-pay | Admitting: Family Medicine

## 2018-03-07 ENCOUNTER — Ambulatory Visit (INDEPENDENT_AMBULATORY_CARE_PROVIDER_SITE_OTHER): Payer: Medicare HMO | Admitting: Internal Medicine

## 2018-03-07 ENCOUNTER — Encounter: Payer: Self-pay | Admitting: Internal Medicine

## 2018-03-07 VITALS — BP 132/68 | HR 71 | Ht 67.0 in | Wt 206.0 lb

## 2018-03-07 DIAGNOSIS — Z1231 Encounter for screening mammogram for malignant neoplasm of breast: Secondary | ICD-10-CM

## 2018-03-07 DIAGNOSIS — M62830 Muscle spasm of back: Secondary | ICD-10-CM

## 2018-03-07 NOTE — Progress Notes (Signed)
Date:  03/07/2018   Name:  Molly Ruiz   DOB:  [birth year]   MRN:  [id number]   Chief Complaint: Breast Pain (Right breast pain. Has not felt pain since calling to make appt. No lumps or discharge. Switched coffee to another brand and wonders if this is the cause. )  She had some right breast discomfort after changing her coffee to one with more caffeine.  She never had a mass, nipple discharge or skin change.  She went back to her original coffee brand and the discomfort resolved. She has not had a mammogram in several years.  The last was back in [place], [place].  Review of Systems  Constitutional: Negative for chills, fatigue and fever.  Respiratory: Negative for cough, chest tightness, shortness of breath and wheezing.   Cardiovascular: Negative for chest pain and palpitations.  Musculoskeletal: Positive for myalgias (muscular left sided back pain).  Psychiatric/Behavioral: Negative for sleep disturbance.    Patient Active Problem List   Diagnosis Date Noted  . Eczema 10/25/2017  . 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 Osteoarthritis of left hip 01/12/2017  . OSA on CPAP 01/02/2017  . Controlled type 2 diabetes mellitus without complication, without long-term current use of insulin ([place]) 06/24/2016  . Hypertension 06/24/2016  . Degenerative disc disease, lumbar 06/24/2016  . Hyperlipidemia 06/24/2016  . Anxiety disorder 06/24/2016  . Obesity (BMI 30.0-34.9) 06/24/2016  . Vitamin D deficiency 06/24/2016  . History of shingles 06/24/2016  . GERD (gastroesophageal reflux disease) 06/24/2016  . FH: heart disease 06/24/2016  . Allergic rhinitis 06/24/2016    Allergies  Allergen Reactions  . Influenza Vaccines Rash    Past Surgical History:  Procedure Laterality Date  . ABDOMINAL HYSTERECTOMY    . BACK SURGERY     mulitple  . BILATERAL CARPAL TUNNEL RELEASE    . FOOT SURGERY      Social History   Tobacco Use  . Smoking status: Never Smoker  . Smokeless tobacco:  Never Used  Substance Use Topics  . Alcohol use: No  . Drug use: No     Medication list has been reviewed and updated.  Current Meds  Medication Sig  . acetaminophen (TYLENOL) 500 MG tablet Take 2 tablets (1,000 mg total) by mouth 2 (two) times daily.  [name] Kitchen amLODipine (NORVASC) 5 MG tablet TAKE 1 TABLET BY MOUTH ONCE DAILY  . buPROPion (WELLBUTRIN XL) 150 MG 24 hr tablet TAKE 1 TABLET BY MOUTH ONCE DAILY  . Cholecalciferol (VITAMIN D3) 2000 units capsule Take 1 capsule (2,000 Units total) by mouth daily.  [name] Kitchen glucose blood test strip Use as instructed  . irbesartan (AVAPRO) 300 MG tablet TAKE 1 TABLET BY MOUTH ONCE DAILY  . Lancets (ONETOUCH ULTRASOFT) lancets Use as instructed to check Blood Sugar daily  . triamcinolone cream (KENALOG) 0.1 % Apply 1 application topically 2 (two) times daily. For rash on leg    PHQ 2/9 Scores 10/25/2017 09/26/2016 09/26/2016 06/24/2016  PHQ - 2 Score 0 0 0 0  PHQ- 9 Score - 1 - -    Physical Exam  Constitutional: She is oriented to person, place, and time. She appears well-developed. No distress.  HENT:  Head: Normocephalic and atraumatic.  Cardiovascular: Normal rate, regular rhythm and normal heart sounds.  Pulmonary/Chest: Effort normal and breath sounds normal. No respiratory distress. Right breast exhibits no inverted nipple, no mass, no nipple discharge, no skin change and no tenderness. Left breast exhibits no inverted nipple, no mass, no nipple discharge,  no skin change and no tenderness.  Musculoskeletal: Normal range of motion.       Arms: Neurological: She is alert and oriented to person, place, and time.  Skin: Skin is warm and dry. No rash noted.  Psychiatric: She has a normal mood and affect. Her behavior is normal. Thought content normal.  Nursing note and vitals reviewed.   BP 132/68 (BP Location: Right Arm, Patient Position: Sitting, Cuff Size: Large)   Pulse 71   Ht 5\' 7"  (1.702 m)   Wt 206 lb (93.4 kg)   SpO2 99%   BMI 32.26  kg/m   Assessment and Plan: 1. Encounter for screening mammogram for breast cancer Schedule at [facility]; Future  2. Muscle spasm of back Recommend heat and regular exercise such as water aerobics Return if worsening   Partially dictated using Editor, commissioning. Any errors are unintentional.  [name], MD [facility]  03/07/2018

## 2018-03-21 ENCOUNTER — Ambulatory Visit
Admission: RE | Admit: 2018-03-21 | Discharge: 2018-03-21 | Disposition: A | Discharge status: Home / Self Care | Payer: Medicare HMO | Source: Ambulatory Visit | Attending: Internal Medicine | Admitting: Internal Medicine

## 2018-03-21 ENCOUNTER — Encounter (INDEPENDENT_AMBULATORY_CARE_PROVIDER_SITE_OTHER): Payer: Self-pay

## 2018-03-21 DIAGNOSIS — Z1231 Encounter for screening mammogram for malignant neoplasm of breast: Secondary | ICD-10-CM | POA: Diagnosis not present

## 2018-03-27 DIAGNOSIS — M9903 Segmental and somatic dysfunction of lumbar region: Secondary | ICD-10-CM | POA: Diagnosis not present

## 2018-03-27 DIAGNOSIS — M9901 Segmental and somatic dysfunction of cervical region: Secondary | ICD-10-CM | POA: Diagnosis not present

## 2018-03-27 DIAGNOSIS — M40292 Other kyphosis, cervical region: Secondary | ICD-10-CM | POA: Diagnosis not present

## 2018-03-27 DIAGNOSIS — M4306 Spondylolysis, lumbar region: Secondary | ICD-10-CM | POA: Diagnosis not present

## 2018-03-28 DIAGNOSIS — M9903 Segmental and somatic dysfunction of lumbar region: Secondary | ICD-10-CM | POA: Diagnosis not present

## 2018-03-28 DIAGNOSIS — M4306 Spondylolysis, lumbar region: Secondary | ICD-10-CM | POA: Diagnosis not present

## 2018-03-28 DIAGNOSIS — M40292 Other kyphosis, cervical region: Secondary | ICD-10-CM | POA: Diagnosis not present

## 2018-03-28 DIAGNOSIS — M9901 Segmental and somatic dysfunction of cervical region: Secondary | ICD-10-CM | POA: Diagnosis not present

## 2018-03-30 DIAGNOSIS — M40292 Other kyphosis, cervical region: Secondary | ICD-10-CM | POA: Diagnosis not present

## 2018-03-30 DIAGNOSIS — M9903 Segmental and somatic dysfunction of lumbar region: Secondary | ICD-10-CM | POA: Diagnosis not present

## 2018-03-30 DIAGNOSIS — M9901 Segmental and somatic dysfunction of cervical region: Secondary | ICD-10-CM | POA: Diagnosis not present

## 2018-03-30 DIAGNOSIS — M4306 Spondylolysis, lumbar region: Secondary | ICD-10-CM | POA: Diagnosis not present

## 2018-04-02 DIAGNOSIS — M9901 Segmental and somatic dysfunction of cervical region: Secondary | ICD-10-CM | POA: Diagnosis not present

## 2018-04-02 DIAGNOSIS — M4306 Spondylolysis, lumbar region: Secondary | ICD-10-CM | POA: Diagnosis not present

## 2018-04-02 DIAGNOSIS — M40292 Other kyphosis, cervical region: Secondary | ICD-10-CM | POA: Diagnosis not present

## 2018-04-02 DIAGNOSIS — M9903 Segmental and somatic dysfunction of lumbar region: Secondary | ICD-10-CM | POA: Diagnosis not present

## 2018-04-03 DIAGNOSIS — M9901 Segmental and somatic dysfunction of cervical region: Secondary | ICD-10-CM | POA: Diagnosis not present

## 2018-04-03 DIAGNOSIS — M4306 Spondylolysis, lumbar region: Secondary | ICD-10-CM | POA: Diagnosis not present

## 2018-04-03 DIAGNOSIS — M9903 Segmental and somatic dysfunction of lumbar region: Secondary | ICD-10-CM | POA: Diagnosis not present

## 2018-04-03 DIAGNOSIS — M40292 Other kyphosis, cervical region: Secondary | ICD-10-CM | POA: Diagnosis not present

## 2018-04-04 DIAGNOSIS — M4306 Spondylolysis, lumbar region: Secondary | ICD-10-CM | POA: Diagnosis not present

## 2018-04-04 DIAGNOSIS — M40292 Other kyphosis, cervical region: Secondary | ICD-10-CM | POA: Diagnosis not present

## 2018-04-04 DIAGNOSIS — M9903 Segmental and somatic dysfunction of lumbar region: Secondary | ICD-10-CM | POA: Diagnosis not present

## 2018-04-04 DIAGNOSIS — M9901 Segmental and somatic dysfunction of cervical region: Secondary | ICD-10-CM | POA: Diagnosis not present

## 2018-04-10 DIAGNOSIS — M9903 Segmental and somatic dysfunction of lumbar region: Secondary | ICD-10-CM | POA: Diagnosis not present

## 2018-04-10 DIAGNOSIS — M9901 Segmental and somatic dysfunction of cervical region: Secondary | ICD-10-CM | POA: Diagnosis not present

## 2018-04-10 DIAGNOSIS — M40292 Other kyphosis, cervical region: Secondary | ICD-10-CM | POA: Diagnosis not present

## 2018-04-10 DIAGNOSIS — M4306 Spondylolysis, lumbar region: Secondary | ICD-10-CM | POA: Diagnosis not present

## 2018-04-11 DIAGNOSIS — M4306 Spondylolysis, lumbar region: Secondary | ICD-10-CM | POA: Diagnosis not present

## 2018-04-11 DIAGNOSIS — M40292 Other kyphosis, cervical region: Secondary | ICD-10-CM | POA: Diagnosis not present

## 2018-04-11 DIAGNOSIS — M9903 Segmental and somatic dysfunction of lumbar region: Secondary | ICD-10-CM | POA: Diagnosis not present

## 2018-04-11 DIAGNOSIS — M9901 Segmental and somatic dysfunction of cervical region: Secondary | ICD-10-CM | POA: Diagnosis not present

## 2018-04-13 DIAGNOSIS — M40292 Other kyphosis, cervical region: Secondary | ICD-10-CM | POA: Diagnosis not present

## 2018-04-13 DIAGNOSIS — M9903 Segmental and somatic dysfunction of lumbar region: Secondary | ICD-10-CM | POA: Diagnosis not present

## 2018-04-13 DIAGNOSIS — M4306 Spondylolysis, lumbar region: Secondary | ICD-10-CM | POA: Diagnosis not present

## 2018-04-13 DIAGNOSIS — M9901 Segmental and somatic dysfunction of cervical region: Secondary | ICD-10-CM | POA: Diagnosis not present

## 2018-04-16 DIAGNOSIS — M4306 Spondylolysis, lumbar region: Secondary | ICD-10-CM | POA: Diagnosis not present

## 2018-04-16 DIAGNOSIS — M9901 Segmental and somatic dysfunction of cervical region: Secondary | ICD-10-CM | POA: Diagnosis not present

## 2018-04-16 DIAGNOSIS — M40292 Other kyphosis, cervical region: Secondary | ICD-10-CM | POA: Diagnosis not present

## 2018-04-16 DIAGNOSIS — M9903 Segmental and somatic dysfunction of lumbar region: Secondary | ICD-10-CM | POA: Diagnosis not present

## 2018-04-18 ENCOUNTER — Ambulatory Visit (INDEPENDENT_AMBULATORY_CARE_PROVIDER_SITE_OTHER): Payer: Medicare HMO

## 2018-04-18 VITALS — BP 122/72 | HR 68 | Temp 97.5°F | Resp 16 | Ht 67.0 in | Wt 203.8 lb

## 2018-04-18 DIAGNOSIS — M4306 Spondylolysis, lumbar region: Secondary | ICD-10-CM | POA: Diagnosis not present

## 2018-04-18 DIAGNOSIS — Z0000 Encounter for general adult medical examination without abnormal findings: Secondary | ICD-10-CM | POA: Diagnosis not present

## 2018-04-18 DIAGNOSIS — M9903 Segmental and somatic dysfunction of lumbar region: Secondary | ICD-10-CM | POA: Diagnosis not present

## 2018-04-18 DIAGNOSIS — Z780 Asymptomatic menopausal state: Secondary | ICD-10-CM

## 2018-04-18 DIAGNOSIS — M9901 Segmental and somatic dysfunction of cervical region: Secondary | ICD-10-CM | POA: Diagnosis not present

## 2018-04-18 DIAGNOSIS — M40292 Other kyphosis, cervical region: Secondary | ICD-10-CM | POA: Diagnosis not present

## 2018-04-18 NOTE — Patient Instructions (Signed)
Molly Ruiz , Thank you for taking time to come for your Medicare Wellness Visit. I appreciate your ongoing commitment to your health goals. Please review the following plan we discussed and let me know if I can assist you in the future.   Screening recommendations/referrals: Colonoscopy: done in 2011 - no longer required Mammogram: done 03/21/18 Bone Density: Please call [phone] to schedule your bone density exam. Recommended yearly ophthalmology/optometry visit for glaucoma screening and checkup Recommended yearly dental visit for hygiene and checkup  Vaccinations: Pneumococcal vaccine: done 08/23/17 Tdap vaccine: done 06/24/16 Shingles vaccine: Shingrix discussed. Please contact your pharmacy for coverage information.     Advanced directives: Please bring a copy of your health care power of attorney and living will to the office at your convenience.  Conditions/risks identified: Recommend increase water intake to 6-8 glasses of water per day.  Next appointment: 04/26/18 10:20 Dr. Army Melia   Preventive Care 77 Years and Older, Female Preventive care refers to lifestyle choices and visits with your health care provider that can promote health and wellness. What does preventive care include?  A yearly physical exam. This is also called an annual well check.  Dental exams once or twice a year.  Routine eye exams. Ask your health care provider how often you should have your eyes checked.  Personal lifestyle choices, including:  Daily care of your teeth and gums.  Regular physical activity.  Eating a healthy diet.  Avoiding tobacco and drug use.  Limiting alcohol use.  Practicing safe sex.  Taking low-dose aspirin every day.  Taking vitamin and mineral supplements as recommended by your health care provider. What happens during an annual well check? The services and screenings done by your health care provider during your annual well check will depend on your age,  overall health, lifestyle risk factors, and family history of disease. Counseling  Your health care provider may ask you questions about your:  Alcohol use.  Tobacco use.  Drug use.  Emotional well-being.  Home and relationship well-being.  Sexual activity.  Eating habits.  History of falls.  Memory and ability to understand (cognition).  Work and work Statistician.  Reproductive health. Screening  You may have the following tests or measurements:  Height, weight, and BMI.  Blood pressure.  Lipid and cholesterol levels. These may be checked every 5 years, or more frequently if you are over 77 years old.  Skin check.  Lung cancer screening. You may have this screening every year starting at age 72 if you have a 30-pack-year history of smoking and currently smoke or have quit within the past 15 years.  Fecal occult blood test (FOBT) of the stool. You may have this test every year starting at age 58.  Flexible sigmoidoscopy or colonoscopy. You may have a sigmoidoscopy every 5 years or a colonoscopy every 10 years starting at age 106.  Hepatitis C blood test.  Hepatitis B blood test.  Sexually transmitted disease (STD) testing.  Diabetes screening. This is done by checking your blood sugar (glucose) after you have not eaten for a while (fasting). You may have this done every 1-3 years.  Bone density scan. This is done to screen for osteoporosis. You may have this done starting at age 19.  Mammogram. This may be done every 1-2 years. Talk to your health care provider about how often you should have regular mammograms. Talk with your health care provider about your test results, treatment options, and if necessary, the need for more tests.  Vaccines  Your health care provider may recommend certain vaccines, such as:  Influenza vaccine. This is recommended every year.  Tetanus, diphtheria, and acellular pertussis (Tdap, Td) vaccine. You may need a Td booster every 10  years.  Zoster vaccine. You may need this after age 11.  Pneumococcal 13-valent conjugate (PCV13) vaccine. One dose is recommended after age 54.  Pneumococcal polysaccharide (PPSV23) vaccine. One dose is recommended after age 33. Talk to your health care provider about which screenings and vaccines you need and how often you need them. This information is not intended to replace advice given to you by your health care provider. Make sure you discuss any questions you have with your health care provider. Document Released: 05/22/2015 Document Revised: 01/13/2016 Document Reviewed: 02/24/2015 Elsevier Interactive Patient Education  2017 [employer] Prevention in the Home Falls can cause injuries. They can happen to people of all ages. There are many things you can do to make your home safe and to help prevent falls. What can I do on the outside of my home?  Regularly fix the edges of walkways and driveways and fix any cracks.  Remove anything that might make you trip as you walk through a door, such as a raised step or threshold.  Trim any bushes or trees on the path to your home.  Use bright outdoor lighting.  Clear any walking paths of anything that might make someone trip, such as rocks or tools.  Regularly check to see if handrails are loose or broken. Make sure that both sides of any steps have handrails.  Any raised decks and porches should have guardrails on the edges.  Have any leaves, snow, or ice cleared regularly.  Use sand or salt on walking paths during winter.  Clean up any spills in your garage right away. This includes oil or grease spills. What can I do in the bathroom?  Use night lights.  Install grab bars by the toilet and in the tub and shower. Do not use towel bars as grab bars.  Use non-skid mats or decals in the tub or shower.  If you need to sit down in the shower, use a plastic, non-slip stool.  Keep the floor dry. Clean up any water that  spills on the floor as soon as it happens.  Remove soap buildup in the tub or shower regularly.  Attach bath mats securely with double-sided non-slip rug tape.  Do not have throw rugs and other things on the floor that can make you trip. What can I do in the bedroom?  Use night lights.  Make sure that you have a light by your bed that is easy to reach.  Do not use any sheets or blankets that are too big for your bed. They should not hang down onto the floor.  Have a firm chair that has side arms. You can use this for support while you get dressed.  Do not have throw rugs and other things on the floor that can make you trip. What can I do in the kitchen?  Clean up any spills right away.  Avoid walking on wet floors.  Keep items that you use a lot in easy-to-reach places.  If you need to reach something above you, use a strong step stool that has a grab bar.  Keep electrical cords out of the way.  Do not use floor polish or wax that makes floors slippery. If you must use wax, use non-skid floor wax.  Do not have throw rugs and other things on the floor that can make you trip. What can I do with my stairs?  Do not leave any items on the stairs.  Make sure that there are handrails on both sides of the stairs and use them. Fix handrails that are broken or loose. Make sure that handrails are as long as the stairways.  Check any carpeting to make sure that it is firmly attached to the stairs. Fix any carpet that is loose or worn.  Avoid having throw rugs at the top or bottom of the stairs. If you do have throw rugs, attach them to the floor with carpet tape.  Make sure that you have a light switch at the top of the stairs and the bottom of the stairs. If you do not have them, ask someone to add them for you. What else can I do to help prevent falls?  Wear shoes that:  Do not have high heels.  Have rubber bottoms.  Are comfortable and fit you well.  Are closed at the  toe. Do not wear sandals.  If you use a stepladder:  Make sure that it is fully opened. Do not climb a closed stepladder.  Make sure that both sides of the stepladder are locked into place.  Ask someone to hold it for you, if possible.  Clearly mark and make sure that you can see:  Any grab bars or handrails.  First and last steps.  Where the edge of each step is.  Use tools that help you move around (mobility aids) if they are needed. These include:  Canes.  Walkers.  Scooters.  Crutches.  Turn on the lights when you go into a dark area. Replace any light bulbs as soon as they burn out.  Set up your furniture so you have a clear path. Avoid moving your furniture around.  If any of your floors are uneven, fix them.  If there are any pets around you, be aware of where they are.  Review your medicines with your doctor. Some medicines can make you feel dizzy. This can increase your chance of falling. Ask your doctor what other things that you can do to help prevent falls. This information is not intended to replace advice given to you by your health care provider. Make sure you discuss any questions you have with your health care provider. Document Released: 02/19/2009 Document Revised: 10/01/2015 Document Reviewed: 05/30/2014 Elsevier Interactive Patient Education  2017 [employer].

## 2018-04-18 NOTE — Progress Notes (Signed)
Subjective:   Molly Ruiz is a 77 y.o. female who presents for Medicare Annual (Subsequent) preventive examination.  Review of Systems:   Cardiac Risk Factors include: diabetes mellitus;hypertension;dyslipidemia;obesity (BMI >30kg/m2) age female > 28     Objective:     Vitals: BP 122/72 (BP Location: Left Arm, Patient Position: Sitting, Cuff Size: Large)   Pulse 68   Temp (!) 97.5 F (36.4 C) (Oral)   Resp 16   Ht 5\' 7"  (1.702 m)   Wt 203 lb 12.8 oz (92.4 kg)   BMI 31.92 kg/m   Body mass index is 31.92 kg/m.  Advanced Directives 04/18/2018 09/26/2016 06/24/2016 04/19/2016  Does Patient Have a Medical Advance Directive? Yes Yes Yes Yes  Type of Paramedic of [place];Living will Healthcare Power of [place] in Chart? No - copy requested No - copy requested - -    Tobacco Social History   Tobacco Use  Smoking Status Never Smoker  Smokeless Tobacco Never Used     Counseling given: Not Answered   Clinical Intake:  Pre-visit preparation completed: Yes  Pain : No/denies pain     Nutritional Status: BMI > 30  Obese Diabetes: Yes CBG done?: No Did pt. bring in CBG monitor from home?: No   Nutrition Risk Assessment:  Has the patient had any N/V/D within the last 2 months?  No  Does the patient have any non-healing wounds?  No  Has the patient had any unintentional weight loss or weight gain?  No   Diabetes:  Is the patient diabetic?  Yes  If diabetic, was a CBG obtained today?  No  Did the patient bring in their glucometer from home?  No  How often do you monitor your CBG's? Once daily.   Financial Strains and Diabetes Management:  Are you having any financial strains with the device, your supplies or your medication? No .  Does the patient want to be seen by Chronic Care Management for management of their diabetes?  No    Would the patient like to be referred to a Nutritionist or for Diabetic Management?  No   Diabetic Exams:   Diabetic Eye Exam: Completed 08/03/17 negative for retinopathy.   Diabetic Foot Exam: Completed 10/25/17.   How often do you need to have someone help you when you read instructions, pamphlets, or other written materials from your doctor or pharmacy?: 1 - Never What is the last grade level you completed in school?: 12th grade  Interpreter Needed?: No  Information entered by :: Clemetine Marker LPN  Past Medical History:  Diagnosis Date  . Depression   . Diabetes mellitus without complication ([place])   . Hyperlipidemia   . Hypertension   . Sleep apnea    Past Surgical History:  Procedure Laterality Date  . ABDOMINAL HYSTERECTOMY    . BACK SURGERY     mulitple  . BILATERAL CARPAL TUNNEL RELEASE    . FOOT SURGERY     Family History  Problem Relation Age of Onset  . Diabetes Mother   . Stroke Mother   . Heart disease Sister   . Stroke Brother   . Breast cancer Other 22   Social History   Socioeconomic History  . Marital status: Widowed    Spouse name: Not on file  . Number of children: 3  . Years of education: Not on file  . Highest education  level: High school graduate  Occupational History  . Occupation: retired  Scientific laboratory technician  . Financial resource strain: Not very hard  . Food insecurity:    Worry: Never true    Inability: Never true  . Transportation needs:    Medical: No    Non-medical: No  Tobacco Use  . Smoking status: Never Smoker  . Smokeless tobacco: Never Used  Substance and Sexual Activity  . Alcohol use: No  . Drug use: No  . Sexual activity: Not on file  Lifestyle  . Physical activity:    Days per week: 3 days    Minutes per session: 60 min  . Stress: Patient refused  Relationships  . Social connections:    Talks on phone: More than three times a week    Gets together: More than three times a week    Attends religious service: More than  4 times per year    Active member of club or organization: Yes    Attends meetings of clubs or organizations: More than 4 times per year    Relationship status: Widowed  Other Topics Concern  . Not on file  Social History Narrative  . Not on file    Outpatient Encounter Medications as of 04/18/2018  Medication Sig  . acetaminophen (TYLENOL) 500 MG tablet Take 2 tablets (1,000 mg total) by mouth 2 (two) times daily.  [name] Kitchen amLODipine (NORVASC) 5 MG tablet TAKE 1 TABLET BY MOUTH ONCE DAILY  . buPROPion (WELLBUTRIN XL) 150 MG 24 hr tablet TAKE 1 TABLET BY MOUTH ONCE DAILY  . Cholecalciferol (VITAMIN D3) 2000 units capsule Take 1 capsule (2,000 Units total) by mouth daily.  [name] Kitchen glucose blood test strip Use as instructed  . irbesartan (AVAPRO) 300 MG tablet TAKE 1 TABLET BY MOUTH ONCE DAILY  . Lancets (ONETOUCH ULTRASOFT) lancets Use as instructed to check Blood Sugar daily  . triamcinolone cream (KENALOG) 0.1 % Apply 1 application topically 2 (two) times daily. For rash on leg   No facility-administered encounter medications on file as of 04/18/2018.     Activities of Daily Living In your present state of health, do you have any difficulty performing the following activities: 04/18/2018 03/07/2018  Hearing? Y N  Comment pt states some difficulty hearing TV occaisonally -  Vision? N N  Comment pt wears eyeglasses -  Difficulty concentrating or making decisions? N N  Walking or climbing stairs? N N  Dressing or bathing? N N  Doing errands, shopping? N N  Preparing Food and eating ? N -  Using the Toilet? N -  In the past six months, have you accidently leaked urine? N -  Do you have problems with loss of bowel control? N -  Managing your Medications? N -  Managing your Finances? N -  Housekeeping or managing your Housekeeping? N -  Some recent data might be hidden    Patient Care Team: Glean Hess, MD as PCP - General (Internal Medicine)    Assessment:   This is a routine  wellness examination for Molly Ruiz.  Exercise Activities and Dietary recommendations Current Exercise Habits: Structured exercise class, Type of exercise: treadmill;Other - see comments(water aerobics and recumbent bike), Time (Minutes): 60, Frequency (Times/Week): 3, Weekly Exercise (Minutes/Week): 180, Intensity: Moderate  Goals    . Increase water intake     Recommend to drink 4-5 glasses of water a day.       Fall Risk Fall Risk  04/18/2018 10/25/2017 09/26/2016 06/24/2016  Falls  in the past year? 0 No No No  Number falls in past yr: 0 - - -   FALL RISK PREVENTION PERTAINING TO THE HOME:  Any stairs in or around the home WITH handrails? Yes  Home free of loose throw rugs in walkways, pet beds, electrical cords, etc? Yes  Adequate lighting in your home to reduce risk of falls? Yes   ASSISTIVE DEVICES UTILIZED TO PREVENT FALLS:  Life alert? No  Use of a cane, walker or w/c? No  Grab bars in the bathroom? Yes  Shower chair or bench in shower? No  Elevated toilet seat or a handicapped toilet? No   DME ORDERS:  DME order needed?  No   TIMED UP AND GO:  Was the test performed? Yes .  Length of time to ambulate 10 feet: 5 sec.   GAIT:  Appearance of gait: Gait stead-fast and without the use of an assistive device.   Education: Fall risk prevention has been discussed.  Intervention(s) required? No   Depression Screen PHQ 2/9 Scores 04/18/2018 10/25/2017 09/26/2016 09/26/2016  PHQ - 2 Score 2 0 0 0  PHQ- 9 Score 3 - 1 -     Cognitive Function      6CIT Screen 04/18/2018 09/26/2016  What Year? 0 points 4 points  What month? 0 points 0 points  What time? 0 points 0 points  Count back from 20 0 points 0 points  Months in reverse 0 points 0 points  Repeat phrase 2 points 2 points  Total Score 2 6    Immunization History  Administered Date(s) Administered  . Pneumococcal Conjugate-13 06/24/2016  . Pneumococcal Polysaccharide-23 08/23/2017  . Tdap 06/24/2016     Qualifies for Shingles Vaccine? Yes . Due for Shingrix. Education has been provided regarding the importance of this vaccine. Pt has been advised to call insurance company to determine out of pocket expense. Advised may also receive vaccine at local pharmacy or Health Dept. Verbalized acceptance and understanding.  Tdap:Up to date  Flu Vaccine: Due for Flu vaccine. Does the patient want to receive this vaccine today?  No  Pt does not tolerate the flu vaccine, reaction of rash.  Pneumococcal Vaccine: Up to date   Screening Tests Health Maintenance  Topic Date Due  . DEXA SCAN  02/16/2006  . HEMOGLOBIN A1C  02/16/2018  . OPHTHALMOLOGY EXAM  08/04/2018  . FOOT EXAM  10/26/2018  . TETANUS/TDAP  06/24/2026  . PNA vac Low Risk Adult  Completed  . INFLUENZA VACCINE  Discontinued    Cancer Screenings:  Colorectal Screening: Completed per pt in 2011. Repeat every 10 years;   Mammogram: Completed 03/21/18. Repeat every year;   Bone Density: Ordered today. Pt provided with contact information and advised to call to schedule appt.   Lung Cancer Screening: (Low Dose CT Chest recommended if Age 55-80 years, 30 pack-year currently smoking OR have quit w/in 15years.) does not qualify.   Additional Screening:  Hepatitis C Screening: no longer required  Vision Screening: Recommended annual ophthalmology exams for early detection of glaucoma and other disorders of the eye. Is the patient up to date with their annual eye exam?  Yes  Who is the provider or what is the name of the office in which the pt attends annual eye exams? [place] Screening: Recommended annual dental exams for proper oral hygiene  Community Resource Referral:  CRR required this visit?  No      Plan:  I have personally reviewed and addressed the Medicare Annual Wellness questionnaire and have noted the following in the patient's chart:  A. Medical and social history B. Use of alcohol,  tobacco or illicit drugs  C. Current medications and supplements D. Functional ability and status E.  Nutritional status F.  Physical activity G. Advance directives H. List of other physicians I.  Hospitalizations, surgeries, and ER visits in previous 12 months J.  [place] such as hearing and vision if needed, cognitive and depression L. Referrals and appointments   In addition, I have reviewed and discussed with patient certain preventive protocols, quality metrics, and best practice recommendations. A written personalized care plan for preventive services as well as general preventive health recommendations were provided to patient.   Signed,  Clemetine Marker, LPN Nurse Health Advisor   Nurse Notes: pt appreciative of today's visit. She does have cataracts beginning to develop and being followed by eye dr for eventual removal.

## 2018-04-23 DIAGNOSIS — M40292 Other kyphosis, cervical region: Secondary | ICD-10-CM | POA: Diagnosis not present

## 2018-04-23 DIAGNOSIS — M9903 Segmental and somatic dysfunction of lumbar region: Secondary | ICD-10-CM | POA: Diagnosis not present

## 2018-04-23 DIAGNOSIS — M4306 Spondylolysis, lumbar region: Secondary | ICD-10-CM | POA: Diagnosis not present

## 2018-04-23 DIAGNOSIS — M9901 Segmental and somatic dysfunction of cervical region: Secondary | ICD-10-CM | POA: Diagnosis not present

## 2018-04-24 DIAGNOSIS — G4733 Obstructive sleep apnea (adult) (pediatric): Secondary | ICD-10-CM | POA: Diagnosis not present

## 2018-04-26 ENCOUNTER — Ambulatory Visit (INDEPENDENT_AMBULATORY_CARE_PROVIDER_SITE_OTHER): Payer: Medicare HMO | Admitting: Internal Medicine

## 2018-04-26 ENCOUNTER — Other Ambulatory Visit: Payer: Self-pay | Admitting: Internal Medicine

## 2018-04-26 ENCOUNTER — Encounter: Payer: Self-pay | Admitting: Internal Medicine

## 2018-04-26 VITALS — BP 126/82 | HR 70 | Ht 67.0 in | Wt 203.0 lb

## 2018-04-26 DIAGNOSIS — Z9989 Dependence on other enabling machines and devices: Secondary | ICD-10-CM

## 2018-04-26 DIAGNOSIS — I10 Essential (primary) hypertension: Secondary | ICD-10-CM | POA: Insufficient documentation

## 2018-04-26 DIAGNOSIS — R69 Illness, unspecified: Secondary | ICD-10-CM | POA: Diagnosis not present

## 2018-04-26 DIAGNOSIS — Z1231 Encounter for screening mammogram for malignant neoplasm of breast: Secondary | ICD-10-CM | POA: Diagnosis not present

## 2018-04-26 DIAGNOSIS — F419 Anxiety disorder, unspecified: Secondary | ICD-10-CM

## 2018-04-26 DIAGNOSIS — E785 Hyperlipidemia, unspecified: Secondary | ICD-10-CM | POA: Diagnosis not present

## 2018-04-26 DIAGNOSIS — E1169 Type 2 diabetes mellitus with other specified complication: Secondary | ICD-10-CM | POA: Diagnosis not present

## 2018-04-26 DIAGNOSIS — G4733 Obstructive sleep apnea (adult) (pediatric): Secondary | ICD-10-CM

## 2018-04-26 DIAGNOSIS — M5440 Lumbago with sciatica, unspecified side: Secondary | ICD-10-CM

## 2018-04-26 DIAGNOSIS — Z0000 Encounter for general adult medical examination without abnormal findings: Secondary | ICD-10-CM | POA: Diagnosis not present

## 2018-04-26 DIAGNOSIS — E119 Type 2 diabetes mellitus without complications: Secondary | ICD-10-CM | POA: Diagnosis not present

## 2018-04-26 LAB — POCT URINALYSIS DIPSTICK
Bilirubin, UA: NEGATIVE
Glucose, UA: NEGATIVE
Ketones, UA: NEGATIVE
Leukocytes, UA: NEGATIVE
Nitrite, UA: NEGATIVE
Protein, UA: NEGATIVE
Spec Grav, UA: 1.01 (ref 1.010–1.025)
Urobilinogen, UA: 0.2 E.U./dL
pH, UA: 6 (ref 5.0–8.0)

## 2018-04-26 MED ORDER — IRBESARTAN 300 MG PO TABS: 300.0000 mg | ORAL_TABLET | Freq: Every day | ORAL | 1 refills | Status: DC

## 2018-04-26 NOTE — Patient Instructions (Signed)
[facility] [place]  [phone]

## 2018-04-26 NOTE — Progress Notes (Signed)
Date:  04/26/2018   Name:  Molly Ruiz   DOB:  [birth year]   MRN:  [id number]   Chief Complaint: Annual Exam (Breast Exam. ) Molly Ruiz is a 77 y.o. female who presents today for her Complete Annual Exam. She feels fairly well. She reports exercising very little due to back pain. She reports she is sleeping well. She recently had a mammogram, denies any breast issues.  DEXA is ordered.  She is up to date on immunizations.  Hypertension  This is a chronic problem. The problem is controlled. Pertinent negatives include no chest pain, headaches, palpitations or shortness of breath. Past treatments include calcium channel blockers and angiotensin blockers. The current treatment provides significant improvement. There are no compliance problems.  There is no history of CVA.  Diabetes  She presents for her follow-up diabetic visit. She has type 2 diabetes mellitus. Her disease course has been stable. Pertinent negatives for hypoglycemia include no dizziness, headaches, nervousness/anxiousness or tremors. Pertinent negatives for diabetes include no chest pain, no fatigue, no polydipsia and no polyuria. Pertinent negatives for diabetic complications include no CVA, nephropathy or peripheral neuropathy. Current diabetic treatment includes diet. She is compliant with treatment most of the time. Her weight is stable. She is following a generally healthy diet. She monitors blood glucose at home 1-2 x per day. Her breakfast blood glucose is taken between 6-7 am. Her breakfast blood glucose range is generally 130-140 mg/dl. An ACE inhibitor/angiotensin II receptor blocker is being taken.  Hyperlipidemia  This is a chronic problem. The problem is uncontrolled. Pertinent negatives include no chest pain or shortness of breath. 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  Back Pain  This is a chronic problem. The problem has been gradually  improving (had a flare up a month ago and now improving) since onset. The pain is present in the lumbar spine. The quality of the pain is described as burning. The pain radiates to the right foot. The pain is mild. Associated symptoms include numbness (tingling in right leg at times when back flares up). Pertinent negatives include no abdominal pain, chest pain, dysuria, fever or headaches. She has tried chiropractic manipulation and analgesics for the symptoms. The treatment provided moderate relief.  OSA - on CPAP nightly and for naps too.  She sleeps well and feel rested in the AM. No headaches or daytime somnolence.  Lab Results  Component Value Date   HGBA1C 6.6 (H) 08/17/2017    Review of Systems  Constitutional: Negative for chills, fatigue and fever.  HENT: Negative for congestion, hearing loss, tinnitus, trouble swallowing and voice change.   Eyes: Negative for visual disturbance.  Respiratory: Negative for cough, chest tightness, shortness of breath and wheezing.   Cardiovascular: Negative for chest pain, palpitations and leg swelling.  Gastrointestinal: Negative for abdominal pain, constipation, diarrhea and vomiting.  Endocrine: Negative for polydipsia and polyuria.  Genitourinary: Negative for dysuria, frequency, genital sores, vaginal bleeding and vaginal discharge.  Musculoskeletal: Positive for back pain. Negative for arthralgias, gait problem and joint swelling.  Skin: Negative for color change and rash.  Neurological: Positive for numbness (tingling in right leg at times when back flares up). Negative for dizziness, tremors, light-headedness and headaches.  Hematological: Negative for adenopathy. Does not bruise/bleed easily.  Psychiatric/Behavioral: Negative for dysphoric mood and sleep disturbance. The patient is not nervous/anxious.     Patient Active Problem List   Diagnosis Date Noted  . Essential  hypertension 04/26/2018  . Anxiety disorder 04/26/2018  . Eczema  10/25/2017  . 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 Osteoarthritis of left hip 01/12/2017  . OSA on CPAP 01/02/2017  . Controlled type 2 diabetes mellitus without complication, without long-term current use of insulin ([place]) 06/24/2016  . 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 Hyperlipidemia associated with type 2 diabetes mellitus ([place]) 06/24/2016  . Obesity (BMI 30.0-34.9) 06/24/2016  . Vitamin D deficiency 06/24/2016  . History of shingles 06/24/2016  . GERD (gastroesophageal reflux disease) 06/24/2016  . FH: heart disease 06/24/2016  . Allergic rhinitis 06/24/2016    Allergies  Allergen Reactions  . Influenza Vaccines Rash    Past Surgical History:  Procedure Laterality Date  . ABDOMINAL HYSTERECTOMY    . BACK SURGERY     mulitple  . BILATERAL CARPAL TUNNEL RELEASE    . FOOT SURGERY      Social History   Tobacco Use  . Smoking status: Never Smoker  . Smokeless tobacco: Never Used  Substance Use Topics  . Alcohol use: No  . Drug use: No     Medication list has been reviewed and updated.  Current Meds  Medication Sig  . acetaminophen (TYLENOL) 500 MG tablet Take 2 tablets (1,000 mg total) by mouth 2 (two) times daily.  [name] Kitchen amLODipine (NORVASC) 5 MG tablet TAKE 1 TABLET BY MOUTH ONCE DAILY  . buPROPion (WELLBUTRIN XL) 150 MG 24 hr tablet TAKE 1 TABLET BY MOUTH ONCE DAILY  . Cholecalciferol (VITAMIN D3) 2000 units capsule Take 1 capsule (2,000 Units total) by mouth daily.  [name] Kitchen glucose blood test strip Use as instructed  . irbesartan (AVAPRO) 300 MG tablet TAKE 1 TABLET BY MOUTH ONCE DAILY  . Lancets (ONETOUCH ULTRASOFT) lancets Use as instructed to check Blood Sugar daily  . NON FORMULARY CPAP @@ 12 cm H20  . triamcinolone cream (KENALOG) 0.1 % Apply 1 application topically 2 (two) times daily. For rash on leg    PHQ 2/9 Scores 04/18/2018 10/25/2017 09/26/2016 09/26/2016  PHQ - 2 Score 2 0 0 0  PHQ- 9 Score 3 - 1 -    Physical  Exam Vitals signs and nursing note reviewed.  Constitutional:      General: She is not in acute distress.    Appearance: She is well-developed.  HENT:     Head: Normocephalic and atraumatic.     Right Ear: Tympanic membrane and ear canal normal.     Left Ear: Tympanic membrane and ear canal normal.     Nose:     Right Sinus: No maxillary sinus tenderness.     Left Sinus: No maxillary sinus tenderness.     Mouth/Throat:     Pharynx: Uvula midline.  Eyes:     General: No scleral icterus.       Right eye: No discharge.        Left eye: No discharge.     Conjunctiva/sclera: Conjunctivae normal.  Neck:     Musculoskeletal: Normal range of motion. No erythema.     Thyroid: No thyromegaly.     Vascular: No carotid bruit.  Cardiovascular:     Rate and Rhythm: Normal rate and regular rhythm.     Pulses:          Dorsalis pedis pulses are 1+ on the right side and 1+ on the left side.       Posterior tibial pulses are 2+ on the right side and 2+ on the left  side.     Heart sounds: Normal heart sounds.  Pulmonary:     Effort: Pulmonary effort is normal. No respiratory distress.     Breath sounds: No wheezing.  Chest:     Breasts:        Right: No mass, nipple discharge, skin change or tenderness.        Left: No mass, nipple discharge, skin change or tenderness.  Abdominal:     General: Bowel sounds are normal.     Palpations: Abdomen is soft.     Tenderness: There is no abdominal tenderness.  Musculoskeletal: Normal range of motion.     Lumbar back: She exhibits no bony tenderness and no spasm.  Lymphadenopathy:     Cervical: No cervical adenopathy.  Skin:    General: Skin is warm and dry.     Findings: No rash.  Neurological:     Mental Status: She is alert and oriented to person, place, and time.     Cranial Nerves: No cranial nerve deficit.     Sensory: Sensation is intact. No sensory deficit.     Motor: Motor function is intact. No weakness or tremor.     Gait: Gait is  intact.     Deep Tendon Reflexes: Reflexes are normal and symmetric.  Psychiatric:        Attention and Perception: Attention normal.        Mood and Affect: Mood normal.        Speech: Speech normal.        Behavior: Behavior normal.        Thought Content: Thought content normal.     BP 126/82 (BP Location: Right Arm, Patient Position: Sitting, Cuff Size: Normal)   Pulse 70   Ht 5\' 7"  (1.702 m)   Wt 203 lb (92.1 kg)   SpO2 98%   BMI 31.79 kg/m   Assessment and Plan: 1. Annual physical exam Normal exam except for weight Continue healthy diet - POCT urinalysis dipstick  2. Encounter for screening mammogram for breast cancer Completed in November  3. Essential hypertension controlled - irbesartan (AVAPRO) 300 MG tablet; Take 1 tablet (300 mg total) by mouth daily.  Dispense: 90 tablet; Refill: 1 - CBC with Differential/Platelet - TSH  4. OSA on CPAP Doing well with excellent compliance and control  5. Controlled type 2 diabetes mellitus without complication, without long-term current use of insulin (HCC) Continue healthy diet, exercise as able Schedule eye exam - Comprehensive metabolic panel - Hemoglobin A1c - Microalbumin / creatinine urine ratio  6. Anxiety disorder, unspecified type Doing well on bupropion once daily  7. Hyperlipidemia associated with type 2 diabetes mellitus ([place]) Pt declines statin therapy - could consider Wellchol, esp if A1C is higher - Lipid panel  8. Back pain of lumbosacral region with sciatica Continue tylenol and chiropractic care Refer to Orthopedics if sx are worsening   Partially dictated using [place]. Any errors are unintentional.  [name], MD [facility]  04/26/2018

## 2018-04-27 DIAGNOSIS — M4306 Spondylolysis, lumbar region: Secondary | ICD-10-CM | POA: Diagnosis not present

## 2018-04-27 DIAGNOSIS — M9901 Segmental and somatic dysfunction of cervical region: Secondary | ICD-10-CM | POA: Diagnosis not present

## 2018-04-27 DIAGNOSIS — M40292 Other kyphosis, cervical region: Secondary | ICD-10-CM | POA: Diagnosis not present

## 2018-04-27 DIAGNOSIS — M9903 Segmental and somatic dysfunction of lumbar region: Secondary | ICD-10-CM | POA: Diagnosis not present

## 2018-04-27 LAB — LIPID PANEL
Chol/HDL Ratio: 4.7 ratio — ABNORMAL HIGH (ref 0.0–4.4)
Cholesterol, Total: 272 mg/dL — ABNORMAL HIGH (ref 100–199)
HDL: 58 mg/dL (ref >39)
LDL CALC: 171 mg/dL — AB (ref 0–99)
Triglycerides: 213 mg/dL — ABNORMAL HIGH (ref 0–149)
VLDL Cholesterol Cal: 43 mg/dL — ABNORMAL HIGH (ref 5–40)

## 2018-04-27 LAB — CBC WITH DIFFERENTIAL/PLATELET
Basophils Absolute: 0.1 10*3/uL (ref 0.0–0.2)
Basos: 2 %
EOS (ABSOLUTE): 0.1 10*3/uL (ref 0.0–0.4)
EOS: 2 %
HEMATOCRIT: 37.9 % (ref 34.0–46.6)
Hemoglobin: 13 g/dL (ref 11.1–15.9)
Immature Grans (Abs): 0 10*3/uL (ref 0.0–0.1)
Immature Granulocytes: 0 %
LYMPHS: 50 %
Lymphocytes Absolute: 2.3 10*3/uL (ref 0.7–3.1)
MCH: 30.6 pg (ref 26.6–33.0)
MCHC: 34.3 g/dL (ref 31.5–35.7)
MCV: 89 fL (ref 79–97)
Monocytes Absolute: 0.4 10*3/uL (ref 0.1–0.9)
Monocytes: 9 %
Neutrophils Absolute: 1.7 10*3/uL (ref 1.4–7.0)
Neutrophils: 37 %
Platelets: 193 10*3/uL (ref 150–450)
RBC: 4.25 x10E6/uL (ref 3.77–5.28)
RDW: 13.2 % (ref 12.3–15.4)
WBC: 4.6 10*3/uL (ref 3.4–10.8)

## 2018-04-27 LAB — COMPREHENSIVE METABOLIC PANEL
ALT: 13 IU/L (ref 0–32)
AST: 16 IU/L (ref 0–40)
Albumin/Globulin Ratio: 1.3 (ref 1.2–2.2)
Albumin: 4.3 g/dL (ref 3.5–4.8)
Alkaline Phosphatase: 116 IU/L (ref 39–117)
BUN/Creatinine Ratio: 18 (ref 12–28)
BUN: 17 mg/dL (ref 8–27)
Bilirubin Total: 0.3 mg/dL (ref 0.0–1.2)
CO2: 25 mmol/L (ref 20–29)
Calcium: 10 mg/dL (ref 8.7–10.3)
Chloride: 100 mmol/L (ref 96–106)
Creatinine, Ser: 0.93 mg/dL (ref 0.57–1.00)
GFR calc non Af Amer: 59 mL/min/{1.73_m2} — ABNORMAL LOW (ref >59)
GFR, EST AFRICAN AMERICAN: 69 mL/min/{1.73_m2} (ref >59)
Globulin, Total: 3.4 g/dL (ref 1.5–4.5)
Glucose: 119 mg/dL — ABNORMAL HIGH (ref 65–99)
Potassium: 4.6 mmol/L (ref 3.5–5.2)
Sodium: 137 mmol/L (ref 134–144)
TOTAL PROTEIN: 7.7 g/dL (ref 6.0–8.5)

## 2018-04-27 LAB — HEMOGLOBIN A1C
Est. average glucose Bld gHb Est-mCnc: 137 mg/dL
Hgb A1c MFr Bld: 6.4 % — ABNORMAL HIGH (ref 4.8–5.6)

## 2018-04-27 LAB — TSH: TSH: 1.37 u[IU]/mL (ref 0.450–4.500)

## 2018-04-30 ENCOUNTER — Other Ambulatory Visit: Payer: Self-pay | Admitting: Internal Medicine

## 2018-04-30 ENCOUNTER — Telehealth: Payer: Self-pay

## 2018-04-30 DIAGNOSIS — M40292 Other kyphosis, cervical region: Secondary | ICD-10-CM | POA: Diagnosis not present

## 2018-04-30 DIAGNOSIS — E785 Hyperlipidemia, unspecified: Principal | ICD-10-CM

## 2018-04-30 DIAGNOSIS — E1169 Type 2 diabetes mellitus with other specified complication: Secondary | ICD-10-CM

## 2018-04-30 DIAGNOSIS — M9903 Segmental and somatic dysfunction of lumbar region: Secondary | ICD-10-CM | POA: Diagnosis not present

## 2018-04-30 DIAGNOSIS — M9901 Segmental and somatic dysfunction of cervical region: Secondary | ICD-10-CM | POA: Diagnosis not present

## 2018-04-30 DIAGNOSIS — M4306 Spondylolysis, lumbar region: Secondary | ICD-10-CM | POA: Diagnosis not present

## 2018-04-30 LAB — MICROALBUMIN / CREATININE URINE RATIO
Creatinine, Urine: 86.6 mg/dL
Microalb/Creat Ratio: <3.5 mg/g creat (ref 0.0–30.0)
Microalbumin, Urine: <3 ug/mL

## 2018-04-30 MED ORDER — COLESEVELAM HCL 625 MG PO TABS: 1875.0000 mg | ORAL_TABLET | Freq: Two times a day (BID) | ORAL | 3 refills | Status: DC

## 2018-04-30 NOTE — Telephone Encounter (Signed)
Advised labs and patient okay to start Welchol meds to lower cholesterol. Send in to [facility] no need to call patient.

## 2018-05-03 NOTE — Progress Notes (Signed)
Spoke with patient about labs and Welchol. She said she tried getting medication from pharmacy and its $70 which is too expensive for her. She called a medication management help line and they are seeing if they can assist her with paying for this medication. I asked the pt to call us either way and let us know the outcome.

## 2018-05-04 DIAGNOSIS — M4306 Spondylolysis, lumbar region: Secondary | ICD-10-CM | POA: Diagnosis not present

## 2018-05-04 DIAGNOSIS — M9901 Segmental and somatic dysfunction of cervical region: Secondary | ICD-10-CM | POA: Diagnosis not present

## 2018-05-04 DIAGNOSIS — M9903 Segmental and somatic dysfunction of lumbar region: Secondary | ICD-10-CM | POA: Diagnosis not present

## 2018-05-04 DIAGNOSIS — M40292 Other kyphosis, cervical region: Secondary | ICD-10-CM | POA: Diagnosis not present

## 2018-05-07 DIAGNOSIS — M9901 Segmental and somatic dysfunction of cervical region: Secondary | ICD-10-CM | POA: Diagnosis not present

## 2018-05-07 DIAGNOSIS — M4306 Spondylolysis, lumbar region: Secondary | ICD-10-CM | POA: Diagnosis not present

## 2018-05-07 DIAGNOSIS — M9903 Segmental and somatic dysfunction of lumbar region: Secondary | ICD-10-CM | POA: Diagnosis not present

## 2018-05-07 DIAGNOSIS — M40292 Other kyphosis, cervical region: Secondary | ICD-10-CM | POA: Diagnosis not present

## 2018-05-10 ENCOUNTER — Telehealth: Payer: Self-pay

## 2018-05-10 NOTE — Telephone Encounter (Signed)
Cholesterol med is $77 and patient wants to try diet and exercise and recheck in 6 mo and then she will pay for meds if no better.

## 2018-05-10 NOTE — Telephone Encounter (Signed)
Medication in question is Welchol.  Okay to recheck next visit.

## 2018-05-11 DIAGNOSIS — H2513 Age-related nuclear cataract, bilateral: Secondary | ICD-10-CM | POA: Diagnosis not present

## 2018-05-11 LAB — HM DIABETES EYE EXAM

## 2018-05-14 ENCOUNTER — Encounter: Payer: Self-pay | Admitting: Internal Medicine

## 2018-05-14 DIAGNOSIS — M9901 Segmental and somatic dysfunction of cervical region: Secondary | ICD-10-CM | POA: Diagnosis not present

## 2018-05-14 DIAGNOSIS — M40292 Other kyphosis, cervical region: Secondary | ICD-10-CM | POA: Diagnosis not present

## 2018-05-14 DIAGNOSIS — M4306 Spondylolysis, lumbar region: Secondary | ICD-10-CM | POA: Diagnosis not present

## 2018-05-14 DIAGNOSIS — M9903 Segmental and somatic dysfunction of lumbar region: Secondary | ICD-10-CM | POA: Diagnosis not present

## 2018-05-15 ENCOUNTER — Inpatient Hospital Stay: Admission: RE | Admit: 2018-05-15 | Payer: Medicare HMO | Source: Ambulatory Visit

## 2018-05-22 DIAGNOSIS — M9901 Segmental and somatic dysfunction of cervical region: Secondary | ICD-10-CM | POA: Diagnosis not present

## 2018-05-22 DIAGNOSIS — M40292 Other kyphosis, cervical region: Secondary | ICD-10-CM | POA: Diagnosis not present

## 2018-05-22 DIAGNOSIS — M9903 Segmental and somatic dysfunction of lumbar region: Secondary | ICD-10-CM | POA: Diagnosis not present

## 2018-05-22 DIAGNOSIS — M4306 Spondylolysis, lumbar region: Secondary | ICD-10-CM | POA: Diagnosis not present

## 2018-05-29 DIAGNOSIS — M9903 Segmental and somatic dysfunction of lumbar region: Secondary | ICD-10-CM | POA: Diagnosis not present

## 2018-05-29 DIAGNOSIS — M4306 Spondylolysis, lumbar region: Secondary | ICD-10-CM | POA: Diagnosis not present

## 2018-05-29 DIAGNOSIS — M9901 Segmental and somatic dysfunction of cervical region: Secondary | ICD-10-CM | POA: Diagnosis not present

## 2018-05-29 DIAGNOSIS — M40292 Other kyphosis, cervical region: Secondary | ICD-10-CM | POA: Diagnosis not present

## 2018-06-06 ENCOUNTER — Ambulatory Visit
Admission: RE | Admit: 2018-06-06 | Discharge: 2018-06-06 | Disposition: A | Discharge status: Home / Self Care | Payer: Medicare HMO | Source: Ambulatory Visit | Attending: Internal Medicine | Admitting: Internal Medicine

## 2018-06-06 ENCOUNTER — Encounter (INDEPENDENT_AMBULATORY_CARE_PROVIDER_SITE_OTHER): Payer: Self-pay

## 2018-06-06 ENCOUNTER — Encounter: Payer: Self-pay | Admitting: Internal Medicine

## 2018-06-06 DIAGNOSIS — M8580 Other specified disorders of bone density and structure, unspecified site: Secondary | ICD-10-CM | POA: Insufficient documentation

## 2018-06-06 DIAGNOSIS — Z780 Asymptomatic menopausal state: Secondary | ICD-10-CM | POA: Diagnosis not present

## 2018-06-06 DIAGNOSIS — M85832 Other specified disorders of bone density and structure, left forearm: Secondary | ICD-10-CM | POA: Diagnosis not present

## 2018-06-20 DIAGNOSIS — M40292 Other kyphosis, cervical region: Secondary | ICD-10-CM | POA: Diagnosis not present

## 2018-06-20 DIAGNOSIS — M9903 Segmental and somatic dysfunction of lumbar region: Secondary | ICD-10-CM | POA: Diagnosis not present

## 2018-06-20 DIAGNOSIS — M4306 Spondylolysis, lumbar region: Secondary | ICD-10-CM | POA: Diagnosis not present

## 2018-06-20 DIAGNOSIS — M9901 Segmental and somatic dysfunction of cervical region: Secondary | ICD-10-CM | POA: Diagnosis not present

## 2018-06-21 DIAGNOSIS — L280 Lichen simplex chronicus: Secondary | ICD-10-CM | POA: Diagnosis not present

## 2018-07-18 DIAGNOSIS — M40292 Other kyphosis, cervical region: Secondary | ICD-10-CM | POA: Diagnosis not present

## 2018-07-18 DIAGNOSIS — M9903 Segmental and somatic dysfunction of lumbar region: Secondary | ICD-10-CM | POA: Diagnosis not present

## 2018-07-18 DIAGNOSIS — M4306 Spondylolysis, lumbar region: Secondary | ICD-10-CM | POA: Diagnosis not present

## 2018-07-18 DIAGNOSIS — M9901 Segmental and somatic dysfunction of cervical region: Secondary | ICD-10-CM | POA: Diagnosis not present

## 2018-09-03 ENCOUNTER — Ambulatory Visit: Admission: RE | Admit: 2018-09-03 | Payer: Medicare HMO | Source: Ambulatory Visit | Admitting: Ophthalmology

## 2018-09-03 ENCOUNTER — Encounter: Admission: RE | Payer: Self-pay | Source: Ambulatory Visit

## 2018-09-03 SURGERY — PHACOEMULSIFICATION, CATARACT, WITH IOL INSERTION
Anesthesia: Topical | Laterality: Right

## 2018-10-09 ENCOUNTER — Other Ambulatory Visit: Payer: Self-pay | Admitting: Family Medicine

## 2018-10-09 DIAGNOSIS — E119 Type 2 diabetes mellitus without complications: Secondary | ICD-10-CM

## 2018-10-15 ENCOUNTER — Other Ambulatory Visit: Payer: Self-pay

## 2018-10-15 ENCOUNTER — Other Ambulatory Visit: Payer: Self-pay | Admitting: Family Medicine

## 2018-10-15 DIAGNOSIS — R69 Illness, unspecified: Secondary | ICD-10-CM | POA: Diagnosis not present

## 2018-10-15 MED ORDER — GLUCOSE BLOOD VI STRP: ORAL_STRIP | 12 refills | Status: DC

## 2018-10-22 DIAGNOSIS — G4733 Obstructive sleep apnea (adult) (pediatric): Secondary | ICD-10-CM | POA: Diagnosis not present

## 2018-10-26 ENCOUNTER — Other Ambulatory Visit: Payer: Self-pay

## 2018-10-26 ENCOUNTER — Encounter: Payer: Self-pay | Admitting: Internal Medicine

## 2018-10-26 ENCOUNTER — Ambulatory Visit (INDEPENDENT_AMBULATORY_CARE_PROVIDER_SITE_OTHER): Payer: Medicare HMO | Admitting: Internal Medicine

## 2018-10-26 VITALS — BP 121/80 | HR 67 | Resp 16 | Ht 67.0 in | Wt 208.0 lb

## 2018-10-26 DIAGNOSIS — I10 Essential (primary) hypertension: Secondary | ICD-10-CM | POA: Diagnosis not present

## 2018-10-26 DIAGNOSIS — E118 Type 2 diabetes mellitus with unspecified complications: Secondary | ICD-10-CM | POA: Diagnosis not present

## 2018-10-26 DIAGNOSIS — E1169 Type 2 diabetes mellitus with other specified complication: Secondary | ICD-10-CM | POA: Diagnosis not present

## 2018-10-26 DIAGNOSIS — E785 Hyperlipidemia, unspecified: Secondary | ICD-10-CM | POA: Diagnosis not present

## 2018-10-26 NOTE — Progress Notes (Signed)
Date:  10/26/2018   Name:  Molly Ruiz   DOB:  26-Oct-1940   MRN:  [id number]   Chief Complaint: Hypertension and Hyperlipidemia  Hypertension This is a chronic problem. The problem is unchanged. The problem is controlled. Pertinent negatives include no chest pain, headaches, palpitations or shortness of breath. Past treatments include calcium channel blockers and angiotensin blockers. The current treatment provides significant improvement.  Diabetes She presents for her follow-up diabetic visit. She has type 2 diabetes mellitus. Her disease course has been stable. Pertinent negatives for hypoglycemia include no headaches or tremors. Pertinent negatives for diabetes include no chest pain, no fatigue, no polydipsia and no polyuria. Current diabetic treatment includes diet. She is compliant with treatment most of the time. Her weight is stable. She monitors blood glucose at home 1-2 x per day. Her breakfast blood glucose is taken between 6-7 am. Her breakfast blood glucose range is generally 140-180 mg/dl. An ACE inhibitor/angiotensin II receptor blocker is being taken.  Hyperlipidemia This is a chronic problem. The problem is uncontrolled. Pertinent negatives include no chest pain or shortness of breath. 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  Started Red Rice yeast last visit).   Lab Results  Component Value Date   HGBA1C 6.4 (H) 04/26/2018   Lab Results  Component Value Date   CREATININE 0.93 04/26/2018   BUN 17 04/26/2018   NA 137 04/26/2018   K 4.6 04/26/2018   CL 100 04/26/2018   CO2 25 04/26/2018   Lab Results  Component Value Date   CHOL 272 (H) 04/26/2018   HDL 58 04/26/2018   LDLCALC 171 (H) 04/26/2018   TRIG 213 (H) 04/26/2018   CHOLHDL 4.7 (H) 04/26/2018    Review of Systems  Constitutional: Negative for appetite change, fatigue, fever and unexpected weight change.  HENT: Negative for tinnitus and  trouble swallowing.   Eyes: Positive for visual disturbance (having cataract surgery soon).  Respiratory: Negative for cough, chest tightness and shortness of breath.   Cardiovascular: Negative for chest pain, palpitations and leg swelling.  Gastrointestinal: Negative for abdominal pain.  Endocrine: Negative for polydipsia and polyuria.  Genitourinary: Negative for dysuria and hematuria.  Musculoskeletal: Positive for back pain. Negative for arthralgias.  Skin: Negative for color change and rash.  Neurological: Negative for tremors, numbness and headaches.  Hematological: Negative for adenopathy.  Psychiatric/Behavioral: Negative for dysphoric mood and sleep disturbance.    Patient Active Problem List   Diagnosis Date Noted  . Osteopenia determined by x-ray 06/06/2018  . Essential hypertension 04/26/2018  . Anxiety disorder 04/26/2018  . Eczema 10/25/2017  . 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 Osteoarthritis of left hip 01/12/2017  . OSA on CPAP 01/02/2017  . Type II diabetes mellitus with complication ([place]) 84/66/5993  . 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 Hyperlipidemia associated with type 2 diabetes mellitus ([place]) 06/24/2016  . Obesity (BMI 30.0-34.9) 06/24/2016  . Vitamin D deficiency 06/24/2016  . History of shingles 06/24/2016  . GERD (gastroesophageal reflux disease) 06/24/2016  . FH: heart disease 06/24/2016  . Allergic rhinitis 06/24/2016    Allergies  Allergen Reactions  . Atorvastatin Other (See Comments)    Peripheral neuropathy  . Influenza Vaccines Rash    Past Surgical History:  Procedure Laterality Date  . ABDOMINAL HYSTERECTOMY    . BACK SURGERY     mulitple  . BILATERAL CARPAL TUNNEL RELEASE    . FOOT SURGERY  Social History   Tobacco Use  . Smoking status: Never Smoker  . Smokeless tobacco: Never Used  Substance Use Topics  . Alcohol use: No  . Drug use: No     Medication list has been reviewed and  updated.  Current Meds  Medication Sig  . acetaminophen (TYLENOL) 500 MG tablet Take 2 tablets (1,000 mg total) by mouth 2 (two) times daily.  [name] Kitchen amLODipine (NORVASC) 5 MG tablet TAKE 1 TABLET BY MOUTH ONCE DAILY  . buPROPion (WELLBUTRIN XL) 150 MG 24 hr tablet Take 1 tablet by mouth once daily  . Cholecalciferol (VITAMIN D3) 2000 units capsule Take 1 capsule (2,000 Units total) by mouth daily.  [name] Kitchen glucose blood (ONETOUCH ULTRA) test strip Test Blood Sugar twice daily.  . irbesartan (AVAPRO) 300 MG tablet Take 1 tablet (300 mg total) by mouth daily.  . Lancets (ONETOUCH ULTRASOFT) lancets USE AS DIRECTED TO CHECK BLOOD SUGAR DAILY  . NON FORMULARY CPAP @@ 12 cm H20  . Red Yeast Rice 600 MG TABS Take by mouth.  . triamcinolone cream (KENALOG) 0.1 % Apply 1 application topically 2 (two) times daily. For rash on leg    PHQ 2/9 Scores 10/26/2018 04/18/2018 10/25/2017 09/26/2016  PHQ - 2 Score 0 2 0 0  PHQ- 9 Score 2 3 - 1    BP Readings from Last 3 Encounters:  10/26/18 121/80  04/26/18 126/82  04/18/18 122/72    Physical Exam Constitutional:      Appearance: Normal appearance.  Neck:     Musculoskeletal: Normal range of motion.     Vascular: No carotid bruit.  Cardiovascular:     Rate and Rhythm: Normal rate and regular rhythm.     Pulses: Normal pulses.          Dorsalis pedis pulses are 2+ on the right side and 2+ on the left side.       Posterior tibial pulses are 2+ on the right side and 2+ on the left side.  Pulmonary:     Effort: Pulmonary effort is normal.     Breath sounds: Normal breath sounds. No wheezing.  Musculoskeletal:        General: No swelling or tenderness.     Right lower leg: No edema.     Left lower leg: No edema.  Lymphadenopathy:     Cervical: No cervical adenopathy.  Skin:    General: Skin is warm and dry.     Capillary Refill: Capillary refill takes less than 2 seconds.  Neurological:     General: No focal deficit present.     Mental Status: She  is alert.  Psychiatric:        Attention and Perception: Attention normal.        Mood and Affect: Mood normal.     Wt Readings from Last 3 Encounters:  10/26/18 208 lb (94.3 kg)  04/26/18 203 lb (92.1 kg)  04/18/18 203 lb 12.8 oz (92.4 kg)    BP 121/80   Pulse 67   Resp 16   Ht 5\' 7"  (1.702 m)   Wt 208 lb (94.3 kg)   SpO2 97%   BMI 32.58 kg/m   Assessment and Plan: 1. Essential hypertension controlled  2. Type II diabetes mellitus with complication (HCC) Home glucoses slightly higher Check labs and advise if medication is needed - Hemoglobin Y8F - Basic metabolic panel  3. Hyperlipidemia associated with type 2 diabetes mellitus ([place]) Now on Red yeast rice - monitor for side  effects since this has a statin like action - Lipid panel   Partially dictated using Editor, commissioning. Any errors are unintentional.  [name], MD [facility]  10/26/2018

## 2018-10-27 LAB — BASIC METABOLIC PANEL
BUN/Creatinine Ratio: 17 (ref 12–28)
BUN: 17 mg/dL (ref 8–27)
CO2: 23 mmol/L (ref 20–29)
Calcium: 9.6 mg/dL (ref 8.7–10.3)
Chloride: 100 mmol/L (ref 96–106)
Creatinine, Ser: 0.99 mg/dL (ref 0.57–1.00)
GFR calc Af Amer: 64 mL/min/{1.73_m2} (ref >59)
GFR calc non Af Amer: 55 mL/min/{1.73_m2} — ABNORMAL LOW (ref >59)
Glucose: 139 mg/dL — ABNORMAL HIGH (ref 65–99)
Potassium: 4.7 mmol/L (ref 3.5–5.2)
Sodium: 138 mmol/L (ref 134–144)

## 2018-10-27 LAB — HEMOGLOBIN A1C
Est. average glucose Bld gHb Est-mCnc: 146 mg/dL
Hgb A1c MFr Bld: 6.7 % — ABNORMAL HIGH (ref 4.8–5.6)

## 2018-10-27 LAB — LIPID PANEL
Chol/HDL Ratio: 5 ratio — ABNORMAL HIGH (ref 0.0–4.4)
Cholesterol, Total: 276 mg/dL — ABNORMAL HIGH (ref 100–199)
HDL: 55 mg/dL (ref >39)
LDL Calculated: 176 mg/dL — ABNORMAL HIGH (ref 0–99)
Triglycerides: 225 mg/dL — ABNORMAL HIGH (ref 0–149)
VLDL Cholesterol Cal: 45 mg/dL — ABNORMAL HIGH (ref 5–40)

## 2018-10-30 DIAGNOSIS — H2511 Age-related nuclear cataract, right eye: Secondary | ICD-10-CM | POA: Diagnosis not present

## 2018-10-30 DIAGNOSIS — E1159 Type 2 diabetes mellitus with other circulatory complications: Secondary | ICD-10-CM | POA: Diagnosis not present

## 2018-11-05 ENCOUNTER — Encounter: Payer: Self-pay | Admitting: *Deleted

## 2018-11-05 ENCOUNTER — Other Ambulatory Visit: Payer: Self-pay

## 2018-11-08 ENCOUNTER — Other Ambulatory Visit
Admission: RE | Admit: 2018-11-08 | Discharge: 2018-11-08 | Disposition: A | Discharge status: Home / Self Care | Payer: Medicare HMO | Source: Ambulatory Visit | Attending: Ophthalmology | Admitting: Ophthalmology

## 2018-11-08 ENCOUNTER — Other Ambulatory Visit: Payer: Self-pay

## 2018-11-08 DIAGNOSIS — Z1159 Encounter for screening for other viral diseases: Secondary | ICD-10-CM | POA: Diagnosis not present

## 2018-11-08 DIAGNOSIS — Z01812 Encounter for preprocedural laboratory examination: Secondary | ICD-10-CM | POA: Insufficient documentation

## 2018-11-08 NOTE — Discharge Instructions (Signed)

## 2018-11-09 LAB — SARS CORONAVIRUS 2 (TAT 6-24 HRS): SARS Coronavirus 2: NEGATIVE

## 2018-11-12 ENCOUNTER — Other Ambulatory Visit: Payer: Self-pay | Admitting: Internal Medicine

## 2018-11-12 ENCOUNTER — Other Ambulatory Visit: Payer: Self-pay

## 2018-11-12 ENCOUNTER — Ambulatory Visit
Admission: RE | Admit: 2018-11-12 | Discharge: 2018-11-12 | Disposition: A | Discharge status: Home / Self Care | Payer: Medicare HMO | Attending: Ophthalmology | Admitting: Ophthalmology

## 2018-11-12 ENCOUNTER — Encounter
Admission: RE | Disposition: A | Discharge status: Home / Self Care | Payer: Self-pay | Source: Home / Self Care | Attending: Ophthalmology

## 2018-11-12 ENCOUNTER — Ambulatory Visit: Payer: Medicare HMO | Admitting: Anesthesiology

## 2018-11-12 DIAGNOSIS — M1990 Unspecified osteoarthritis, unspecified site: Secondary | ICD-10-CM | POA: Diagnosis not present

## 2018-11-12 DIAGNOSIS — E1136 Type 2 diabetes mellitus with diabetic cataract: Secondary | ICD-10-CM | POA: Insufficient documentation

## 2018-11-12 DIAGNOSIS — Z79899 Other long term (current) drug therapy: Secondary | ICD-10-CM | POA: Diagnosis not present

## 2018-11-12 DIAGNOSIS — I10 Essential (primary) hypertension: Secondary | ICD-10-CM | POA: Diagnosis not present

## 2018-11-12 DIAGNOSIS — G4730 Sleep apnea, unspecified: Secondary | ICD-10-CM | POA: Diagnosis not present

## 2018-11-12 DIAGNOSIS — H2511 Age-related nuclear cataract, right eye: Secondary | ICD-10-CM | POA: Insufficient documentation

## 2018-11-12 DIAGNOSIS — H25811 Combined forms of age-related cataract, right eye: Secondary | ICD-10-CM | POA: Diagnosis not present

## 2018-11-12 HISTORY — DX: Other complications of anesthesia, initial encounter: T88.59XA

## 2018-11-12 HISTORY — DX: Presence of dental prosthetic device (complete) (partial): Z97.2

## 2018-11-12 HISTORY — PX: CATARACT EXTRACTION W/PHACO: SHX586

## 2018-11-12 SURGERY — PHACOEMULSIFICATION, CATARACT, WITH IOL INSERTION
Anesthesia: Topical | Site: Eye | Laterality: Right

## 2018-11-12 MED ORDER — EPINEPHRINE PF 1 MG/ML IJ SOLN
INTRAOCULAR | Status: DC | PRN
  Administered 2018-11-12: 76 mL via OPHTHALMIC

## 2018-11-12 MED ORDER — MOXIFLOXACIN HCL 0.5 % OP SOLN
OPHTHALMIC | Status: DC | PRN
  Administered 2018-11-12: 0.2 mL via OPHTHALMIC

## 2018-11-12 MED ORDER — ACETAMINOPHEN 325 MG PO TABS: 325.0000 mg | ORAL_TABLET | Freq: Once | ORAL | Status: DC

## 2018-11-12 MED ORDER — LIDOCAINE HCL (PF) 2 % IJ SOLN
INTRAOCULAR | Status: DC | PRN
  Administered 2018-11-12: 1 mL via INTRAOCULAR

## 2018-11-12 MED ORDER — SODIUM HYALURONATE 23 MG/ML IO SOLN
INTRAOCULAR | Status: DC | PRN
  Administered 2018-11-12: 0.6 mL via INTRAOCULAR

## 2018-11-12 MED ORDER — SODIUM HYALURONATE 10 MG/ML IO SOLN
INTRAOCULAR | Status: DC | PRN
  Administered 2018-11-12: 0.55 mL via INTRAOCULAR

## 2018-11-12 MED ORDER — MIDAZOLAM HCL 2 MG/2ML IJ SOLN
INTRAMUSCULAR | Status: DC | PRN
  Administered 2018-11-12: 1 mg via INTRAVENOUS

## 2018-11-12 MED ORDER — ARMC OPHTHALMIC DILATING DROPS
1.0000 "application " | OPHTHALMIC | Status: DC | PRN
  Administered 2018-11-12 (×3): 1 via OPHTHALMIC

## 2018-11-12 MED ORDER — ACETAMINOPHEN 160 MG/5ML PO SOLN: 325.0000 mg | Freq: Once | ORAL | Status: DC

## 2018-11-12 MED ORDER — FENTANYL CITRATE (PF) 100 MCG/2ML IJ SOLN
INTRAMUSCULAR | Status: DC | PRN
  Administered 2018-11-12: 50 ug via INTRAVENOUS

## 2018-11-12 MED ORDER — TETRACAINE HCL 0.5 % OP SOLN
1.0000 [drp] | OPHTHALMIC | Status: DC | PRN
  Administered 2018-11-12 (×3): 1 [drp] via OPHTHALMIC

## 2018-11-12 SURGICAL SUPPLY — 17 items
CANNULA ANT/CHMB 27GA (MISCELLANEOUS) ×4 IMPLANT
DISSECTOR HYDRO NUCLEUS 50X22 (MISCELLANEOUS) ×2 IMPLANT
GLOVE SURG LX 7.5 STRW (GLOVE) ×1
GLOVE SURG LX STRL 7.5 STRW (GLOVE) ×1 IMPLANT
GLOVE SURG SYN 8.5  E (GLOVE) ×1
GLOVE SURG SYN 8.5 E (GLOVE) ×1 IMPLANT
GOWN STRL REUS W/ TWL LRG LVL3 (GOWN DISPOSABLE) ×2 IMPLANT
GOWN STRL REUS W/TWL LRG LVL3 (GOWN DISPOSABLE) ×2
LENS IOL TECNIS ITEC 18.0 (Intraocular Lens) ×2 IMPLANT
MARKER SKIN DUAL TIP RULER LAB (MISCELLANEOUS) ×2 IMPLANT
PACK DR. KING ARMS (PACKS) ×2 IMPLANT
PACK EYE AFTER SURG (MISCELLANEOUS) ×2 IMPLANT
PACK OPTHALMIC (MISCELLANEOUS) ×2 IMPLANT
SYR 3ML LL SCALE MARK (SYRINGE) ×2 IMPLANT
SYR TB 1ML LUER SLIP (SYRINGE) ×2 IMPLANT
WATER STERILE IRR 250ML POUR (IV SOLUTION) ×2 IMPLANT
WIPE NON LINTING 3.25X3.25 (MISCELLANEOUS) ×2 IMPLANT

## 2018-11-12 NOTE — Transfer of Care (Signed)
Immediate Anesthesia Transfer of Care Note  Patient: Molly Ruiz  Procedure(s) Performed: CATARACT EXTRACTION PHACO AND INTRAOCULAR LENS PLACEMENT (IOC)  RIGHT DIABETIC (Right Eye)  Patient Location: PACU  Anesthesia Type: No value filed.  Level of Consciousness: awake, alert  and patient cooperative  Airway and Oxygen Therapy: Patient Spontanous Breathing and Patient connected to supplemental oxygen  Post-op Assessment: Post-op Vital signs reviewed, Patient's Cardiovascular Status Stable, Respiratory Function Stable, Patent Airway and No signs of Nausea or vomiting  Post-op Vital Signs: Reviewed and stable  Complications: No apparent anesthesia complications

## 2018-11-12 NOTE — Op Note (Signed)
OPERATIVE NOTE  Molly Ruiz [id number] 11/12/2018   PREOPERATIVE DIAGNOSIS:  Nuclear sclerotic cataract right eye.  H25.11   POSTOPERATIVE DIAGNOSIS:    Nuclear sclerotic cataract right eye.     PROCEDURE:  Phacoemusification with posterior chamber intraocular lens placement of the right eye   LENS:   Implant Name Type Inv. Item Serial No. Manufacturer Lot No. LRB No. Used Action  LENS IOL DIOP 18.0 - [id number] Intraocular Lens LENS IOL DIOP 18.0 [id number] AMO  Right 1 Implanted       PCB00 +18.0   ULTRASOUND TIME: 0 minutes 59 seconds.  CDE 7.50   SURGEON:  Benay Pillow, MD, MPH  ANESTHESIOLOGIST: Anesthesiologist: Ronelle Nigh, MD CRNA: Cameron Ali, CRNA   ANESTHESIA:  Topical with tetracaine drops augmented with 1% preservative-free intracameral lidocaine.  ESTIMATED BLOOD LOSS: less than 1 mL.   COMPLICATIONS:  None.   DESCRIPTION OF PROCEDURE:  The patient was identified in the holding room and transported to the operating room and placed in the supine position under the operating microscope.  The right eye was identified as the operative eye and it was prepped and draped in the usual sterile ophthalmic fashion.   A 1.0 millimeter clear-corneal paracentesis was made at the 10:30 position. 0.5 ml of preservative-free 1% lidocaine with epinephrine was injected into the anterior chamber.  The anterior chamber was filled with Healon 5 viscoelastic.  A 2.4 millimeter keratome was used to make a near-clear corneal incision at the 8:00 position.  A curvilinear capsulorrhexis was made with a cystotome and capsulorrhexis forceps.  Balanced salt solution was used to hydrodissect and hydrodelineate the nucleus.   Phacoemulsification was then used in stop and chop fashion to remove the lens nucleus and epinucleus.  The remaining cortex was then removed using the irrigation and aspiration handpiece. Healon was then placed into the capsular bag to distend it for lens placement.  A  lens was then injected into the capsular bag.  The remaining viscoelastic was aspirated.   Wounds were hydrated with balanced salt solution.  The anterior chamber was inflated to a physiologic pressure with balanced salt solution.   Intracameral vigamox 0.1 mL undiluted was injected into the eye and a drop placed onto the ocular surface.  No wound leaks were noted.  The patient was taken to the recovery room in stable condition without complications of anesthesia or surgery  Benay Pillow 11/12/2018, 7:55 AM

## 2018-11-12 NOTE — H&P (Signed)

## 2018-11-12 NOTE — Anesthesia Postprocedure Evaluation (Signed)
Anesthesia Post Note  Patient: Molly Ruiz  Procedure(s) Performed: CATARACT EXTRACTION PHACO AND INTRAOCULAR LENS PLACEMENT (IOC)  RIGHT DIABETIC (Right Eye)  Patient location during evaluation: PACU Level of consciousness: awake and alert and oriented Pain management: satisfactory to patient Vital Signs Assessment: post-procedure vital signs reviewed and stable Respiratory status: spontaneous breathing, nonlabored ventilation and respiratory function stable Cardiovascular status: blood pressure returned to baseline and stable Postop Assessment: Adequate PO intake and No signs of nausea or vomiting Anesthetic complications: no    Raliegh Ip

## 2018-11-12 NOTE — Anesthesia Preprocedure Evaluation (Signed)
Anesthesia Evaluation  Patient identified by MRN, date of birth, ID band Patient awake    Reviewed: Allergy & Precautions, H&P , NPO status , Patient's Chart, lab work & pertinent test results  Airway Mallampati: II  TM Distance: >3 FB Neck ROM: full    Dental no notable dental hx.    Pulmonary sleep apnea ,    Pulmonary exam normal breath sounds clear to auscultation       Cardiovascular hypertension, Normal cardiovascular exam Rhythm:regular Rate:Normal     Neuro/Psych PSYCHIATRIC DISORDERS    GI/Hepatic GERD  ,  Endo/Other  diabetes  Renal/GU      Musculoskeletal   Abdominal   Peds  Hematology   Anesthesia Other Findings   Reproductive/Obstetrics                             Anesthesia Physical Anesthesia Plan  ASA: II  Anesthesia Plan:    Post-op Pain Management:    Induction:   PONV Risk Score and Plan: 2 and Midazolam and Treatment may vary due to age or medical condition  Airway Management Planned:   Additional Equipment:   Intra-op Plan:   Post-operative Plan:   Informed Consent: I have reviewed the patients History and Physical, chart, labs and discussed the procedure including the risks, benefits and alternatives for the proposed anesthesia with the patient or authorized representative who has indicated his/her understanding and acceptance.       Plan Discussed with: CRNA  Anesthesia Plan Comments:         Anesthesia Quick Evaluation

## 2018-11-12 NOTE — Anesthesia Procedure Notes (Signed)
Procedure Name: MAC Date/Time: 11/12/2018 7:35 AM Performed by: Cameron Ali, CRNA Pre-anesthesia Checklist: Patient identified, Emergency Drugs available, Suction available, Timeout performed and Patient being monitored Patient Re-evaluated:Patient Re-evaluated prior to induction Oxygen Delivery Method: Nasal cannula Placement Confirmation: positive ETCO2

## 2018-11-13 ENCOUNTER — Encounter: Payer: Self-pay | Admitting: Ophthalmology

## 2018-12-02 DIAGNOSIS — R69 Illness, unspecified: Secondary | ICD-10-CM | POA: Diagnosis not present

## 2018-12-15 DIAGNOSIS — Z0100 Encounter for examination of eyes and vision without abnormal findings: Secondary | ICD-10-CM | POA: Diagnosis not present

## 2018-12-22 ENCOUNTER — Other Ambulatory Visit: Payer: Self-pay | Admitting: Internal Medicine

## 2018-12-22 DIAGNOSIS — I10 Essential (primary) hypertension: Secondary | ICD-10-CM

## 2019-02-06 ENCOUNTER — Telehealth: Payer: Self-pay

## 2019-02-06 NOTE — Telephone Encounter (Signed)
Patient called saying she is having issues with constipation. She has not had a BM since last Wednesday- 01/30/19. The last BM was hard with no blood or mucous. She has taken a [employer] brand laxative in tablet form since she has not be able to go to have a BM. This is not helping. She is not currently bloated or having any pain. She drinks at least 6 bottles of water a day and eats regular sized meals.   Please advise?

## 2019-02-06 NOTE — Telephone Encounter (Signed)
Start Miralax 1 capful with 8 oz fluid twice a day.  Once having 2-3 stools per day, reduce dose to daily and continue indefinitely.

## 2019-02-07 ENCOUNTER — Other Ambulatory Visit: Payer: Self-pay

## 2019-02-07 MED ORDER — POLYETHYLENE GLYCOL 3350 17 GM/SCOOP PO POWD: 17.0000 g | Freq: Two times a day (BID) | ORAL | 1 refills | Status: DC | PRN

## 2019-02-07 NOTE — Telephone Encounter (Signed)
Patient informed and sent in Miralax. Told her she may need to take this indefinitely. She verb understanding.

## 2019-02-07 NOTE — Progress Notes (Signed)
miralax

## 2019-02-19 ENCOUNTER — Other Ambulatory Visit: Payer: Self-pay | Admitting: Internal Medicine

## 2019-02-19 DIAGNOSIS — Z1231 Encounter for screening mammogram for malignant neoplasm of breast: Secondary | ICD-10-CM

## 2019-02-25 DIAGNOSIS — M9901 Segmental and somatic dysfunction of cervical region: Secondary | ICD-10-CM | POA: Diagnosis not present

## 2019-02-25 DIAGNOSIS — M40292 Other kyphosis, cervical region: Secondary | ICD-10-CM | POA: Diagnosis not present

## 2019-02-25 DIAGNOSIS — M9903 Segmental and somatic dysfunction of lumbar region: Secondary | ICD-10-CM | POA: Diagnosis not present

## 2019-02-25 DIAGNOSIS — M4306 Spondylolysis, lumbar region: Secondary | ICD-10-CM | POA: Diagnosis not present

## 2019-02-27 DIAGNOSIS — M4306 Spondylolysis, lumbar region: Secondary | ICD-10-CM | POA: Diagnosis not present

## 2019-02-27 DIAGNOSIS — M9901 Segmental and somatic dysfunction of cervical region: Secondary | ICD-10-CM | POA: Diagnosis not present

## 2019-02-27 DIAGNOSIS — M40292 Other kyphosis, cervical region: Secondary | ICD-10-CM | POA: Diagnosis not present

## 2019-02-27 DIAGNOSIS — M9903 Segmental and somatic dysfunction of lumbar region: Secondary | ICD-10-CM | POA: Diagnosis not present

## 2019-03-04 DIAGNOSIS — M4306 Spondylolysis, lumbar region: Secondary | ICD-10-CM | POA: Diagnosis not present

## 2019-03-04 DIAGNOSIS — M40292 Other kyphosis, cervical region: Secondary | ICD-10-CM | POA: Diagnosis not present

## 2019-03-04 DIAGNOSIS — M9901 Segmental and somatic dysfunction of cervical region: Secondary | ICD-10-CM | POA: Diagnosis not present

## 2019-03-04 DIAGNOSIS — M9903 Segmental and somatic dysfunction of lumbar region: Secondary | ICD-10-CM | POA: Diagnosis not present

## 2019-03-06 DIAGNOSIS — M40292 Other kyphosis, cervical region: Secondary | ICD-10-CM | POA: Diagnosis not present

## 2019-03-06 DIAGNOSIS — M9903 Segmental and somatic dysfunction of lumbar region: Secondary | ICD-10-CM | POA: Diagnosis not present

## 2019-03-06 DIAGNOSIS — M9901 Segmental and somatic dysfunction of cervical region: Secondary | ICD-10-CM | POA: Diagnosis not present

## 2019-03-06 DIAGNOSIS — M4306 Spondylolysis, lumbar region: Secondary | ICD-10-CM | POA: Diagnosis not present

## 2019-03-12 DIAGNOSIS — K5900 Constipation, unspecified: Secondary | ICD-10-CM | POA: Diagnosis not present

## 2019-03-12 DIAGNOSIS — Z809 Family history of malignant neoplasm, unspecified: Secondary | ICD-10-CM | POA: Diagnosis not present

## 2019-03-12 DIAGNOSIS — R69 Illness, unspecified: Secondary | ICD-10-CM | POA: Diagnosis not present

## 2019-03-12 DIAGNOSIS — M1990 Unspecified osteoarthritis, unspecified site: Secondary | ICD-10-CM | POA: Diagnosis not present

## 2019-03-12 DIAGNOSIS — G8929 Other chronic pain: Secondary | ICD-10-CM | POA: Diagnosis not present

## 2019-03-12 DIAGNOSIS — G4730 Sleep apnea, unspecified: Secondary | ICD-10-CM | POA: Diagnosis not present

## 2019-03-12 DIAGNOSIS — F419 Anxiety disorder, unspecified: Secondary | ICD-10-CM | POA: Diagnosis not present

## 2019-03-12 DIAGNOSIS — Z803 Family history of malignant neoplasm of breast: Secondary | ICD-10-CM | POA: Diagnosis not present

## 2019-03-12 DIAGNOSIS — I10 Essential (primary) hypertension: Secondary | ICD-10-CM | POA: Diagnosis not present

## 2019-03-12 DIAGNOSIS — E669 Obesity, unspecified: Secondary | ICD-10-CM | POA: Diagnosis not present

## 2019-03-18 DIAGNOSIS — M40292 Other kyphosis, cervical region: Secondary | ICD-10-CM | POA: Diagnosis not present

## 2019-03-18 DIAGNOSIS — M9903 Segmental and somatic dysfunction of lumbar region: Secondary | ICD-10-CM | POA: Diagnosis not present

## 2019-03-18 DIAGNOSIS — M9901 Segmental and somatic dysfunction of cervical region: Secondary | ICD-10-CM | POA: Diagnosis not present

## 2019-03-18 DIAGNOSIS — M4306 Spondylolysis, lumbar region: Secondary | ICD-10-CM | POA: Diagnosis not present

## 2019-03-23 ENCOUNTER — Other Ambulatory Visit: Payer: Self-pay | Admitting: Internal Medicine

## 2019-04-01 ENCOUNTER — Ambulatory Visit
Admission: RE | Admit: 2019-04-01 | Discharge: 2019-04-01 | Disposition: A | Discharge status: Home / Self Care | Payer: Medicare HMO | Source: Ambulatory Visit | Attending: Internal Medicine | Admitting: Internal Medicine

## 2019-04-01 ENCOUNTER — Other Ambulatory Visit: Payer: Self-pay

## 2019-04-01 DIAGNOSIS — Z1231 Encounter for screening mammogram for malignant neoplasm of breast: Secondary | ICD-10-CM

## 2019-04-15 DIAGNOSIS — M40292 Other kyphosis, cervical region: Secondary | ICD-10-CM | POA: Diagnosis not present

## 2019-04-15 DIAGNOSIS — M9903 Segmental and somatic dysfunction of lumbar region: Secondary | ICD-10-CM | POA: Diagnosis not present

## 2019-04-15 DIAGNOSIS — M9901 Segmental and somatic dysfunction of cervical region: Secondary | ICD-10-CM | POA: Diagnosis not present

## 2019-04-15 DIAGNOSIS — M4306 Spondylolysis, lumbar region: Secondary | ICD-10-CM | POA: Diagnosis not present

## 2019-04-18 DIAGNOSIS — Z20828 Contact with and (suspected) exposure to other viral communicable diseases: Secondary | ICD-10-CM | POA: Diagnosis not present

## 2019-04-22 ENCOUNTER — Ambulatory Visit: Payer: Medicare HMO

## 2019-04-24 ENCOUNTER — Ambulatory Visit: Payer: Medicare HMO

## 2019-05-01 ENCOUNTER — Encounter: Payer: Medicare HMO | Admitting: Internal Medicine

## 2019-05-13 ENCOUNTER — Ambulatory Visit (INDEPENDENT_AMBULATORY_CARE_PROVIDER_SITE_OTHER): Payer: Medicare HMO

## 2019-05-13 VITALS — Ht 67.0 in | Wt 199.0 lb

## 2019-05-13 DIAGNOSIS — M9901 Segmental and somatic dysfunction of cervical region: Secondary | ICD-10-CM | POA: Diagnosis not present

## 2019-05-13 DIAGNOSIS — M40292 Other kyphosis, cervical region: Secondary | ICD-10-CM | POA: Diagnosis not present

## 2019-05-13 DIAGNOSIS — Z0000 Encounter for general adult medical examination without abnormal findings: Secondary | ICD-10-CM

## 2019-05-13 DIAGNOSIS — M4306 Spondylolysis, lumbar region: Secondary | ICD-10-CM | POA: Diagnosis not present

## 2019-05-13 DIAGNOSIS — M9903 Segmental and somatic dysfunction of lumbar region: Secondary | ICD-10-CM | POA: Diagnosis not present

## 2019-05-13 NOTE — Patient Instructions (Signed)
[name] , Thank you for taking time to come for your Medicare Wellness Visit. I appreciate your ongoing commitment to your health goals. Please review the following plan we discussed and let me know if I can assist you in the future.   Screening recommendations/referrals: Colonoscopy: no longer required Mammogram: done 04/01/19 Bone Density: done 06/06/18 Recommended yearly ophthalmology/optometry visit for glaucoma screening and checkup Recommended yearly dental visit for hygiene and checkup  Vaccinations: Pneumococcal vaccine: done 08/23/17 Tdap vaccine: done 06/24/16 Shingles vaccine: Shingrix discussed. Please contact your pharmacy for coverage information.   Advanced directives: Advance directive discussed with you today. I have provided a copy for you to complete at home and have notarized. Once this is complete please bring a copy in to our office so we can scan it into your chart.  Conditions/risks identified: recommend increasing physical activity as tolerated  Next appointment: Please follow up in one year for your Medicare Annual Wellness visit.     Preventive Care 33 Years and Older, Female Preventive care refers to lifestyle choices and visits with your health care provider that can promote health and wellness. What does preventive care include?  A yearly physical exam. This is also called an annual well check.  Dental exams once or twice a year.  Routine eye exams. Ask your health care provider how often you should have your eyes checked.  Personal lifestyle choices, including:  Daily care of your teeth and gums.  Regular physical activity.  Eating a healthy diet.  Avoiding tobacco and drug use.  Limiting alcohol use.  Practicing safe sex.  Taking low-dose aspirin every day.  Taking vitamin and mineral supplements as recommended by your health care provider. What happens during an annual well check? The services and screenings done by your health care  provider during your annual well check will depend on your age, overall health, lifestyle risk factors, and family history of disease. Counseling  Your health care provider may ask you questions about your:  Alcohol use.  Tobacco use.  Drug use.  Emotional well-being.  Home and relationship well-being.  Sexual activity.  Eating habits.  History of falls.  Memory and ability to understand (cognition).  Work and work Statistician.  Reproductive health. Screening  You may have the following tests or measurements:  Height, weight, and BMI.  Blood pressure.  Lipid and cholesterol levels. These may be checked every 5 years, or more frequently if you are over 12 years old.  Skin check.  Lung cancer screening. You may have this screening every year starting at age 71 if you have a 30-pack-year history of smoking and currently smoke or have quit within the past 15 years.  Fecal occult blood test (FOBT) of the stool. You may have this test every year starting at age 59.  Flexible sigmoidoscopy or colonoscopy. You may have a sigmoidoscopy every 5 years or a colonoscopy every 10 years starting at age 58.  Hepatitis C blood test.  Hepatitis B blood test.  Sexually transmitted disease (STD) testing.  Diabetes screening. This is done by checking your blood sugar (glucose) after you have not eaten for a while (fasting). You may have this done every 1-3 years.  Bone density scan. This is done to screen for osteoporosis. You may have this done starting at age 37.  Mammogram. This may be done every 1-2 years. Talk to your health care provider about how often you should have regular mammograms. Talk with your health care provider about your  test results, treatment options, and if necessary, the need for more tests. Vaccines  Your health care provider may recommend certain vaccines, such as:  Influenza vaccine. This is recommended every year.  Tetanus, diphtheria, and acellular  pertussis (Tdap, Td) vaccine. You may need a Td booster every 10 years.  Zoster vaccine. You may need this after age 27.  Pneumococcal 13-valent conjugate (PCV13) vaccine. One dose is recommended after age 37.  Pneumococcal polysaccharide (PPSV23) vaccine. One dose is recommended after age 23. Talk to your health care provider about which screenings and vaccines you need and how often you need them. This information is not intended to replace advice given to you by your health care provider. Make sure you discuss any questions you have with your health care provider. Document Released: 05/22/2015 Document Revised: 01/13/2016 Document Reviewed: 02/24/2015 Elsevier Interactive Patient Education  2017 [place] Prevention in the Home Falls can cause injuries. They can happen to people of all ages. There are many things you can do to make your home safe and to help prevent falls. What can I do on the outside of my home?  Regularly fix the edges of walkways and driveways and fix any cracks.  Remove anything that might make you trip as you walk through a door, such as a raised step or threshold.  Trim any bushes or trees on the path to your home.  Use bright outdoor lighting.  Clear any walking paths of anything that might make someone trip, such as rocks or tools.  Regularly check to see if handrails are loose or broken. Make sure that both sides of any steps have handrails.  Any raised decks and porches should have guardrails on the edges.  Have any leaves, snow, or ice cleared regularly.  Use sand or salt on walking paths during winter.  Clean up any spills in your garage right away. This includes oil or grease spills. What can I do in the bathroom?  Use night lights.  Install grab bars by the toilet and in the tub and shower. Do not use towel bars as grab bars.  Use non-skid mats or decals in the tub or shower.  If you need to sit down in the shower, use a plastic,  non-slip stool.  Keep the floor dry. Clean up any water that spills on the floor as soon as it happens.  Remove soap buildup in the tub or shower regularly.  Attach bath mats securely with double-sided non-slip rug tape.  Do not have throw rugs and other things on the floor that can make you trip. What can I do in the bedroom?  Use night lights.  Make sure that you have a light by your bed that is easy to reach.  Do not use any sheets or blankets that are too big for your bed. They should not hang down onto the floor.  Have a firm chair that has side arms. You can use this for support while you get dressed.  Do not have throw rugs and other things on the floor that can make you trip. What can I do in the kitchen?  Clean up any spills right away.  Avoid walking on wet floors.  Keep items that you use a lot in easy-to-reach places.  If you need to reach something above you, use a strong step stool that has a grab bar.  Keep electrical cords out of the way.  Do not use floor polish or wax that  makes floors slippery. If you must use wax, use non-skid floor wax.  Do not have throw rugs and other things on the floor that can make you trip. What can I do with my stairs?  Do not leave any items on the stairs.  Make sure that there are handrails on both sides of the stairs and use them. Fix handrails that are broken or loose. Make sure that handrails are as long as the stairways.  Check any carpeting to make sure that it is firmly attached to the stairs. Fix any carpet that is loose or worn.  Avoid having throw rugs at the top or bottom of the stairs. If you do have throw rugs, attach them to the floor with carpet tape.  Make sure that you have a light switch at the top of the stairs and the bottom of the stairs. If you do not have them, ask someone to add them for you. What else can I do to help prevent falls?  Wear shoes that:  Do not have high heels.  Have rubber  bottoms.  Are comfortable and fit you well.  Are closed at the toe. Do not wear sandals.  If you use a stepladder:  Make sure that it is fully opened. Do not climb a closed stepladder.  Make sure that both sides of the stepladder are locked into place.  Ask someone to hold it for you, if possible.  Clearly mark and make sure that you can see:  Any grab bars or handrails.  First and last steps.  Where the edge of each step is.  Use tools that help you move around (mobility aids) if they are needed. These include:  Canes.  Walkers.  Scooters.  Crutches.  Turn on the lights when you go into a dark area. Replace any light bulbs as soon as they burn out.  Set up your furniture so you have a clear path. Avoid moving your furniture around.  If any of your floors are uneven, fix them.  If there are any pets around you, be aware of where they are.  Review your medicines with your doctor. Some medicines can make you feel dizzy. This can increase your chance of falling. Ask your doctor what other things that you can do to help prevent falls. This information is not intended to replace advice given to you by your health care provider. Make sure you discuss any questions you have with your health care provider. Document Released: 02/19/2009 Document Revised: 10/01/2015 Document Reviewed: 05/30/2014 Elsevier Interactive Patient Education  2017 [employer].

## 2019-05-13 NOTE — Progress Notes (Signed)
Subjective:   Molly Ruiz is a 79 y.o. female who presents for Medicare Annual (Subsequent) preventive examination.  Virtual Visit via Telephone Note  I connected with Molly Ruiz on 05/13/19 at 10:00 AM EST by telephone and verified that I am speaking with the correct person using two identifiers.  Medicare Annual Wellness visit completed telephonically due to Covid-19 pandemic.   Location: Patient: home Provider: office   I discussed the limitations, risks, security and privacy concerns of performing an evaluation and management service by telephone and the availability of in person appointments. The patient expressed understanding and agreed to proceed.  Some vital signs may be absent or patient reported.   Molly Marker, LPN    Review of Systems:   Cardiac Risk Factors include: advanced age (>33men, >30 women);diabetes mellitus;hypertension;obesity (BMI >30kg/m2)     Objective:     Vitals: Ht 5\' 7"  (1.702 m)   Wt 199 lb (90.3 kg)   BMI 31.17 kg/m   Body mass index is 31.17 kg/m.  Advanced Directives 05/13/2019 04/18/2018 09/26/2016 06/24/2016 04/19/2016  Does Patient Have a Medical Advance Directive? No Yes Yes Yes Yes  Type of Advance Directive - [place];Living will Healthcare Power of Attorney Living will;Healthcare Power of [place]  Does patient want to make changes to medical advance directive? Yes (MAU/Ambulatory/Procedural Areas - Information given) - - - -  Copy of Healthcare Power of Attorney in Chart? - No - copy requested No - copy requested - -    Tobacco Social History   Tobacco Use  Smoking Status Never Smoker  Smokeless Tobacco Never Used     Counseling given: Not Answered   Clinical Intake:  Pre-visit preparation completed: Yes  Pain : No/denies pain     BMI - recorded: 31.17 Nutritional Status: BMI > 30  Obese Nutritional Risks: None Diabetes: Yes CBG done?: No Did pt. bring in  CBG monitor from home?: No   Nutrition Risk Assessment:  Has the patient had any N/V/D within the last 2 months?  No  Does the patient have any non-healing wounds?  No  Has the patient had any unintentional weight loss or weight gain?  No   Diabetes:  Is the patient diabetic?  yes If diabetic, was a CBG obtained today?  No  Did the patient bring in their glucometer from home?  No  How often do you monitor your CBG's? Every so often per patient.   Financial Strains and Diabetes Management:  Are you having any financial strains with the device, your supplies or your medication? No .  Does the patient want to be seen by Chronic Care Management for management of their diabetes?  No  Would the patient like to be referred to a Nutritionist or for Diabetic Management?  No   Diabetic Exams:  Diabetic Eye Exam: Completed 05/11/18 negative retinopathy. Pt has follow up from cataract surgery.   Diabetic Foot Exam: Completed 04/26/18. Pt has been advised about the importance in completing this exam. Pt is scheduled for diabetic foot exam on 06/13/19.   How often do you need to have someone help you when you read instructions, pamphlets, or other written materials from your doctor or pharmacy?: 1 - Never  Interpreter Needed?: No  Information entered by :: Molly Marker LPN  Past Medical History:  Diagnosis Date  . Complication of anesthesia    Hair falls out.  . Depression   . Diabetes mellitus without complication (  [place])   . Hyperlipidemia   . Hypertension   . Sleep apnea    CPAP  . Wears dentures    partial upper   Past Surgical History:  Procedure Laterality Date  . ABDOMINAL HYSTERECTOMY    . BILATERAL CARPAL TUNNEL RELEASE    . CATARACT EXTRACTION W/PHACO Right 11/12/2018   Procedure: CATARACT EXTRACTION PHACO AND INTRAOCULAR LENS PLACEMENT ([place])  RIGHT DIABETIC;  Surgeon: Eulogio Bear, MD;  Location: [facility];  Service: Ophthalmology;  Laterality: Right;  Diabetic  - diet controlled sleep apnea  . CERVICAL FUSION  2004  . FOOT SURGERY    . LUMBAR DISC SURGERY  1998   Family History  Problem Relation Age of Onset  . Diabetes Mother   . Stroke Mother   . Heart disease Sister   . Stroke Brother   . Prostate cancer Brother   . Breast cancer Other 55   Social History   Socioeconomic History  . Marital status: Widowed    Spouse name: Not on file  . Number of children: 3  . Years of education: Not on file  . Highest education level: High school graduate  Occupational History  . Occupation: retired  Tobacco Use  . Smoking status: Never Smoker  . Smokeless tobacco: Never Used  Substance and Sexual Activity  . Alcohol use: No  . Drug use: No  . Sexual activity: Not on file  Other Topics Concern  . Not on file  Social History Narrative  . Not on file   Social Determinants of Health   Financial Resource Strain: Low Risk   . Difficulty of Paying Living Expenses: Not very hard  Food Insecurity: No Food Insecurity  . Worried About Charity fundraiser in the Last Year: Never true  . Ran Out of Food in the Last Year: Never true  Transportation Needs: No Transportation Needs  . Lack of Transportation (Medical): No  . Lack of Transportation (Non-Medical): No  Physical Activity: Inactive  . Days of Exercise per Week: 0 days  . Minutes of Exercise per Session: 0 min  Stress: No Stress Concern Present  . Feeling of Stress : Only a little  Social Connections: Slightly Isolated  . Frequency of Communication with Friends and Family: More than three times a week  . Frequency of Social Gatherings with Friends and Family: Three times a week  . Attends Religious Services: More than 4 times per year  . Active Member of Clubs or Organizations: Yes  . Attends Archivist Meetings: More than 4 times per year  . Marital Status: Widowed    Outpatient Encounter Medications as of 05/13/2019  Medication Sig  . acetaminophen (TYLENOL) 500 MG  tablet Take 2 tablets (1,000 mg total) by mouth 2 (two) times daily.  [name] Kitchen amLODipine (NORVASC) 5 MG tablet Take 1 tablet by mouth once daily  . betamethasone dipropionate 0.05 % lotion Apply topically 2 (two) times daily as needed.  [name] Kitchen buPROPion (WELLBUTRIN XL) 150 MG 24 hr tablet Take 1 tablet by mouth once daily  . CALCIUM CITRATE-VITAMIN D PO Take by mouth daily.  . cetirizine (ZYRTEC) 10 MG tablet Take 10 mg by mouth daily as needed for allergies.  [name] Kitchen glucose blood (ONETOUCH ULTRA) test strip Test Blood Sugar twice daily.  . irbesartan (AVAPRO) 300 MG tablet Take 1 tablet by mouth once daily  . Lancets (ONETOUCH ULTRASOFT) lancets USE AS DIRECTED TO CHECK BLOOD SUGAR DAILY  . NON FORMULARY CPAP @@  12 cm H20  . polyethylene glycol powder (GLYCOLAX/MIRALAX) 17 GM/SCOOP powder Take 17 g by mouth 2 (two) times daily as needed for moderate constipation.  . [DISCONTINUED] triamcinolone cream (KENALOG) 0.1 % Apply 1 application topically 2 (two) times daily. For rash on leg   No facility-administered encounter medications on file as of 05/13/2019.    Activities of Daily Living In your present state of health, do you have any difficulty performing the following activities: 05/13/2019 11/12/2018  Hearing? N N  Comment declines hearing aids -  Vision? N N  Difficulty concentrating or making decisions? N N  Walking or climbing stairs? N N  Dressing or bathing? N N  Doing errands, shopping? N -  Preparing Food and eating ? N -  Using the Toilet? N -  In the past six months, have you accidently leaked urine? N -  Do you have problems with loss of bowel control? N -  Managing your Medications? N -  Managing your Finances? N -  Housekeeping or managing your Housekeeping? N -  Some recent data might be hidden    Patient Care Team: Glean Hess, MD as PCP - General (Internal Medicine)    Assessment:   This is a routine wellness examination for [place].  Exercise Activities and Dietary  recommendations Current Exercise Habits: The patient does not participate in regular exercise at present, Exercise limited by: orthopedic condition(s)(back pain)  Goals    . Increase physical activity     Recommend increasing physical activity to at least 3 days per week    . Increase water intake     Recommend to drink 4-5 glasses of water a day.       Fall Risk Fall Risk  05/13/2019 10/26/2018 04/18/2018 10/25/2017 09/26/2016  Falls in the past year? 0 0 0 No No  Number falls in past yr: 0 0 0 - -  Injury with Fall? 0 0 - - -  Risk for fall due to : No Fall Risks - - - -  Follow up Falls prevention discussed - - - -   FALL RISK PREVENTION PERTAINING TO THE HOME:  Any stairs in or around the home? No  If so, do they handrails? No   Home free of loose throw rugs in walkways, pet beds, electrical cords, etc? Yes  Adequate lighting in your home to reduce risk of falls? Yes   ASSISTIVE DEVICES UTILIZED TO PREVENT FALLS:  Life alert? No  Use of a cane, walker or w/c? No  Grab bars in the bathroom? Yes  Shower chair or bench in shower? No  Elevated toilet seat or a handicapped toilet? No   DME ORDERS:  DME order needed?  No   TIMED UP AND GO:  Was the test performed? No . Telephonic visit.   Education: Fall risk prevention has been discussed.  Intervention(s) required? No    Depression Screen PHQ 2/9 Scores 05/13/2019 10/26/2018 04/18/2018 10/25/2017  PHQ - 2 Score 0 0 2 0  PHQ- 9 Score - 2 3 -     Cognitive Function     6CIT Screen 05/13/2019 04/18/2018 09/26/2016  What Year? 0 points 0 points 4 points  What month? 0 points 0 points 0 points  What time? 0 points 0 points 0 points  Count back from 20 0 points 0 points 0 points  Months in reverse 0 points 0 points 0 points  Repeat phrase 2 points 2 points 2 points  Total Score 2  2 6    Immunization History  Administered Date(s) Administered  . Pneumococcal Conjugate-13 06/24/2016  . Pneumococcal Polysaccharide-23  08/23/2017  . Tdap 06/24/2016    Qualifies for Shingles Vaccine? Yes  . Due for Shingrix. Education has been provided regarding the importance of this vaccine. Pt has been advised to call insurance company to determine out of pocket expense. Advised may also receive vaccine at local pharmacy or Health Dept. Verbalized acceptance and understanding.  Tdap: Up to date  Flu Vaccine: Due for Flu vaccine. Does the patient want to receive this vaccine today?  No .   Pneumococcal Vaccine: Up to date   Screening Tests Health Maintenance  Topic Date Due  . FOOT EXAM  04/27/2019  . HEMOGLOBIN A1C  04/27/2019  . OPHTHALMOLOGY EXAM  05/12/2019  . TETANUS/TDAP  06/24/2026  . DEXA SCAN  Completed  . PNA vac Low Risk Adult  Completed  . INFLUENZA VACCINE  Discontinued   Cancer Screenings:  Colorectal Screening:  No longer required.   Mammogram: Completed 04/01/19. Repeat every year.   Bone Density: Completed 06/06/18. Results reflect  OSTEOPENIA. Repeat every 2 years.   Lung Cancer Screening: (Low Dose CT Chest recommended if Age 39-80 years, 30 pack-year currently smoking OR have quit w/in 15years.) does not qualify.   Additional Screening:  Hepatitis C Screening: does not qualify;  Vision Screening: Recommended annual ophthalmology exams for early detection of glaucoma and other disorders of the eye. Is the patient up to date with their annual eye exam?  Yes  Who is the provider or what is the name of the office in which the pt attends annual eye exams? [facility] Screening: Recommended annual dental exams for proper oral hygiene  Community Resource Referral:  CRR required this visit?  No      Plan:     I have personally reviewed and addressed the Medicare Annual Wellness questionnaire and have noted the following in the patient's chart:  A. Medical and social history B. Use of alcohol, tobacco or illicit drugs  C. Current medications and  supplements D. Functional ability and status E.  Nutritional status F.  Physical activity G. Advance directives H. List of other physicians I.  Hospitalizations, surgeries, and ER visits in previous 12 months J.  [place] such as hearing and vision if needed, cognitive and depression L. Referrals and appointments   In addition, I have reviewed and discussed with patient certain preventive protocols, quality metrics, and best practice recommendations. A written personalized care plan for preventive services as well as general preventive health recommendations were provided to patient.   Signed,  Molly Marker, LPN Nurse Health Advisor   Nurse Notes: none

## 2019-05-24 DIAGNOSIS — G4733 Obstructive sleep apnea (adult) (pediatric): Secondary | ICD-10-CM | POA: Diagnosis not present

## 2019-06-13 ENCOUNTER — Encounter: Payer: Self-pay | Admitting: Internal Medicine

## 2019-06-13 ENCOUNTER — Other Ambulatory Visit: Payer: Self-pay

## 2019-06-13 ENCOUNTER — Ambulatory Visit (INDEPENDENT_AMBULATORY_CARE_PROVIDER_SITE_OTHER): Payer: Medicare HMO | Admitting: Internal Medicine

## 2019-06-13 ENCOUNTER — Telehealth: Payer: Self-pay

## 2019-06-13 VITALS — BP 122/78 | HR 70 | Temp 97.9°F | Ht 67.0 in | Wt 205.0 lb

## 2019-06-13 DIAGNOSIS — Z1211 Encounter for screening for malignant neoplasm of colon: Secondary | ICD-10-CM

## 2019-06-13 DIAGNOSIS — I10 Essential (primary) hypertension: Secondary | ICD-10-CM | POA: Diagnosis not present

## 2019-06-13 DIAGNOSIS — E1169 Type 2 diabetes mellitus with other specified complication: Secondary | ICD-10-CM | POA: Diagnosis not present

## 2019-06-13 DIAGNOSIS — E785 Hyperlipidemia, unspecified: Secondary | ICD-10-CM

## 2019-06-13 DIAGNOSIS — Z1231 Encounter for screening mammogram for malignant neoplasm of breast: Secondary | ICD-10-CM

## 2019-06-13 DIAGNOSIS — F419 Anxiety disorder, unspecified: Secondary | ICD-10-CM | POA: Diagnosis not present

## 2019-06-13 DIAGNOSIS — E118 Type 2 diabetes mellitus with unspecified complications: Secondary | ICD-10-CM | POA: Diagnosis not present

## 2019-06-13 DIAGNOSIS — Z0000 Encounter for general adult medical examination without abnormal findings: Secondary | ICD-10-CM

## 2019-06-13 DIAGNOSIS — R69 Illness, unspecified: Secondary | ICD-10-CM | POA: Diagnosis not present

## 2019-06-13 LAB — POCT URINALYSIS DIPSTICK
Bilirubin, UA: NEGATIVE
Glucose, UA: NEGATIVE
Ketones, UA: NEGATIVE
Leukocytes, UA: NEGATIVE
Nitrite, UA: NEGATIVE
Protein, UA: NEGATIVE
Spec Grav, UA: 1.015 (ref 1.010–1.025)
Urobilinogen, UA: 0.2 E.U./dL
pH, UA: 6 (ref 5.0–8.0)

## 2019-06-13 NOTE — Telephone Encounter (Signed)
Added patient to the [facility] vaccine waitlist.

## 2019-06-13 NOTE — Progress Notes (Signed)
Date:  06/13/2019   Name:  Molly Ruiz   DOB:  1940/08/19   MRN:  [id number]   Chief Complaint: Annual Exam (Breast exam. No pap.) and Diabetes (Foot exam. A1C. Needs eye exam done.) Molly Ruiz is a 79 y.o. female who presents today for her Complete Annual Exam. She feels well. She reports exercising stretching at home and some walking. She reports she is sleeping fairly well. She denies breast complaints.  Due for DM eye exam Mammogram 03/2019 DEXA  05/2018 Colonoscopy ? Normal about 10 yrs ago; no hx of polyps Immunization History  Administered Date(s) Administered  . Pneumococcal Conjugate-13 06/24/2016  . Pneumococcal Polysaccharide-23 08/23/2017  . Tdap 06/24/2016    Diabetes She presents for her follow-up diabetic visit. She has type 2 diabetes mellitus. Her disease course has been stable. Pertinent negatives for hypoglycemia include no dizziness, headaches, nervousness/anxiousness or tremors. Pertinent negatives for diabetes include no chest pain, no fatigue, no polydipsia, no polyuria and no weakness. Current diabetic treatment includes diet. She is compliant with treatment most of the time. Her weight is stable. She is following a generally healthy diet. She monitors blood glucose at home 1-2 x per week. Her breakfast blood glucose is taken between 6-7 am. Her breakfast blood glucose range is generally 110-130 mg/dl. An ACE inhibitor/angiotensin II receptor blocker is being taken. Eye exam is not current.  Hypertension This is a chronic problem. The problem is controlled. Pertinent negatives include no chest pain, headaches, palpitations or shortness of breath. Past treatments include angiotensin blockers and beta blockers. The current treatment provides significant improvement. There are no compliance problems.   Hyperlipidemia This is a chronic problem. The problem is uncontrolled (pt unable to take statin therapy). Pertinent negatives include no chest pain or shortness of  breath.    Lab Results  Component Value Date   CREATININE 0.99 10/26/2018   BUN 17 10/26/2018   NA 138 10/26/2018   K 4.7 10/26/2018   CL 100 10/26/2018   CO2 23 10/26/2018   Lab Results  Component Value Date   CHOL 276 (H) 10/26/2018   HDL 55 10/26/2018   LDLCALC 176 (H) 10/26/2018   TRIG 225 (H) 10/26/2018   CHOLHDL 5.0 (H) 10/26/2018   Lab Results  Component Value Date   TSH 1.370 04/26/2018   Lab Results  Component Value Date   HGBA1C 6.7 (H) 10/26/2018     Review of Systems  Constitutional: Negative for chills, fatigue, fever and unexpected weight change.  HENT: Negative for congestion, hearing loss, tinnitus, trouble swallowing and voice change.   Eyes: Negative for visual disturbance (intermittent eye dryness).  Respiratory: Negative for cough, chest tightness, shortness of breath and wheezing.   Cardiovascular: Negative for chest pain, palpitations and leg swelling.  Gastrointestinal: Negative for abdominal pain, constipation, diarrhea and vomiting.  Endocrine: Negative for polydipsia and polyuria.  Genitourinary: Negative for dysuria, frequency, genital sores, vaginal bleeding and vaginal discharge.  Musculoskeletal: Negative for arthralgias, gait problem and joint swelling.  Skin: Negative for color change and rash.  Allergic/Immunologic: Negative for environmental allergies.  Neurological: Negative for dizziness, tremors, weakness, light-headedness and headaches.  Hematological: Negative for adenopathy. Does not bruise/bleed easily.  Psychiatric/Behavioral: Negative for dysphoric mood and sleep disturbance. The patient is not nervous/anxious.     Patient Active Problem List   Diagnosis Date Noted  . Anxiety disorder 06/13/2019  . Osteopenia determined by x-ray 06/06/2018  . Essential hypertension 04/26/2018  . Eczema 10/25/2017  .  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 Osteoarthritis of left hip 01/12/2017  . OSA on CPAP 01/02/2017  . Type II  diabetes mellitus with complication ([place]) [id number]  . 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 Hyperlipidemia associated with type 2 diabetes mellitus ([place]) 06/24/2016  . Obesity (BMI 30.0-34.9) 06/24/2016  . Vitamin D deficiency 06/24/2016  . History of shingles 06/24/2016  . GERD (gastroesophageal reflux disease) 06/24/2016  . FH: heart disease 06/24/2016  . Allergic rhinitis 06/24/2016    Allergies  Allergen Reactions  . Atorvastatin Other (See Comments)    Peripheral neuropathy  . Influenza Vaccines Rash    Past Surgical History:  Procedure Laterality Date  . ABDOMINAL HYSTERECTOMY    . BILATERAL CARPAL TUNNEL RELEASE    . CATARACT EXTRACTION W/PHACO Right 11/12/2018   Procedure: CATARACT EXTRACTION PHACO AND INTRAOCULAR LENS PLACEMENT ([place])  RIGHT DIABETIC;  Surgeon: Eulogio Bear, MD;  Location: [facility];  Service: Ophthalmology;  Laterality: Right;  Diabetic - diet controlled sleep apnea  . CERVICAL FUSION  2004  . FOOT SURGERY    . LUMBAR DISC SURGERY  1998    Social History   Tobacco Use  . Smoking status: Never Smoker  . Smokeless tobacco: Never Used  Substance Use Topics  . Alcohol use: No  . Drug use: No     Medication list has been reviewed and updated.  Current Meds  Medication Sig  . acetaminophen (TYLENOL) 500 MG tablet Take 2 tablets (1,000 mg total) by mouth 2 (two) times daily.  [name] Kitchen amLODipine (NORVASC) 5 MG tablet Take 1 tablet by mouth once daily  . betamethasone dipropionate 0.05 % lotion Apply topically 2 (two) times daily as needed.  [name] Kitchen buPROPion (WELLBUTRIN XL) 150 MG 24 hr tablet Take 1 tablet by mouth once daily  . CALCIUM CITRATE-VITAMIN D PO Take by mouth daily.  . cetirizine (ZYRTEC) 10 MG tablet Take 10 mg by mouth daily as needed for allergies.  [name] Kitchen glucose blood (ONETOUCH ULTRA) test strip Test Blood Sugar twice daily.  . irbesartan (AVAPRO) 300 MG tablet Take 1 tablet by mouth once daily  .  Lancets (ONETOUCH ULTRASOFT) lancets USE AS DIRECTED TO CHECK BLOOD SUGAR DAILY  . NON FORMULARY CPAP @@ 12 cm H20  . polyethylene glycol powder (GLYCOLAX/MIRALAX) 17 GM/SCOOP powder Take 17 g by mouth 2 (two) times daily as needed for moderate constipation.    PHQ 2/9 Scores 06/13/2019 05/13/2019 10/26/2018 04/18/2018  PHQ - 2 Score 0 0 0 2  PHQ- 9 Score 1 - 2 3    BP Readings from Last 3 Encounters:  06/13/19 122/78  11/12/18 127/64  10/26/18 121/80    Physical Exam Vitals and nursing note reviewed.  Constitutional:      General: She is not in acute distress.    Appearance: She is well-developed.  HENT:     Head: Normocephalic and atraumatic.     Right Ear: Tympanic membrane and ear canal normal.     Left Ear: Tympanic membrane and ear canal normal.     Nose:     Right Sinus: No maxillary sinus tenderness.     Left Sinus: No maxillary sinus tenderness.  Eyes:     General: No scleral icterus.       Right eye: No discharge.        Left eye: No discharge.     Conjunctiva/sclera: Conjunctivae normal.  Neck:     Thyroid: No thyromegaly.  Vascular: No carotid bruit.  Cardiovascular:     Rate and Rhythm: Normal rate and regular rhythm.     Pulses: Normal pulses.     Heart sounds: Normal heart sounds.  Pulmonary:     Effort: Pulmonary effort is normal. No respiratory distress.     Breath sounds: No wheezing.  Chest:     Breasts:        Right: No mass, nipple discharge, skin change or tenderness.        Left: No mass, nipple discharge, skin change or tenderness.  Abdominal:     General: Bowel sounds are normal.     Palpations: Abdomen is soft.     Tenderness: There is no abdominal tenderness.  Musculoskeletal:        General: Normal range of motion.     Cervical back: Normal range of motion. No erythema.     Right lower leg: No edema.     Left lower leg: No edema.  Lymphadenopathy:     Cervical: No cervical adenopathy.  Skin:    General: Skin is warm and dry.      Findings: No rash.  Neurological:     Mental Status: She is alert and oriented to person, place, and time.     Cranial Nerves: No cranial nerve deficit.     Sensory: No sensory deficit.     Deep Tendon Reflexes: Reflexes are normal and symmetric.  Psychiatric:        Speech: Speech normal.        Behavior: Behavior normal.        Thought Content: Thought content normal.     Wt Readings from Last 3 Encounters:  06/13/19 205 lb (93 kg)  05/13/19 199 lb (90.3 kg)  11/12/18 203 lb 14.8 oz (92.5 kg)    BP 122/78   Pulse 70   Temp 97.9 F (36.6 C) (Oral)   Ht 5\' 7"  (1.702 m)   Wt 205 lb (93 kg)   SpO2 98%   BMI 32.11 kg/m   Assessment and Plan: 1. Annual physical exam Normal exam except for weight Continue to work on diet and exercise as able - POCT urinalysis dipstick  2. Encounter for screening mammogram for breast cancer Recently completed  3. Essential hypertension Clinically stable exam with well controlled BP on amlodipine 5 mg and irbesartan 300 mg. Tolerating medications without side effects at this time. Pt to continue current regimen and low sodium diet; benefits of regular exercise as able discussed. - CBC with Differential/Platelet - Comprehensive metabolic panel - TSH  4. Type II diabetes mellitus with complication (HCC) Clinically stable by exam and report without s/s of hypoglycemia. DM complicated by htn, lipids. Doing well on diet alone at this time. Pt reminded to schedule annual DM eye exam - Hemoglobin A1c  5. Hyperlipidemia associated with type 2 diabetes mellitus (HCC) Intolerant of statins due to neuropathy - Lipid panel  6. Colon cancer screening - Fecal occult blood, imunochemical  7. Anxiety disorder, unspecified type Clinically stable with no side effects to Buproprion Minimal anxiety - dealing well with Covid restrictions   Partially dictated using Editor, commissioning. Any errors are unintentional.  [name], MD [facility]  06/13/2019

## 2019-06-13 NOTE — Patient Instructions (Signed)
You have been placed on the waiting list for the Covid vaccine. Please check your e-mail daily for messages regarding a vaccine appointment. You may also be called so also check voice messages regularly.

## 2019-06-14 LAB — COMPREHENSIVE METABOLIC PANEL
ALT: 13 IU/L (ref 0–32)
AST: 21 IU/L (ref 0–40)
Albumin/Globulin Ratio: 1.1 — ABNORMAL LOW (ref 1.2–2.2)
Albumin: 4 g/dL (ref 3.7–4.7)
Alkaline Phosphatase: 109 IU/L (ref 39–117)
BUN/Creatinine Ratio: 14 (ref 12–28)
BUN: 14 mg/dL (ref 8–27)
Bilirubin Total: 0.4 mg/dL (ref 0.0–1.2)
CO2: 24 mmol/L (ref 20–29)
Calcium: 10 mg/dL (ref 8.7–10.3)
Chloride: 102 mmol/L (ref 96–106)
Creatinine, Ser: 1.02 mg/dL — ABNORMAL HIGH (ref 0.57–1.00)
GFR calc Af Amer: 61 mL/min/{1.73_m2} (ref >59)
GFR calc non Af Amer: 53 mL/min/{1.73_m2} — ABNORMAL LOW (ref >59)
Globulin, Total: 3.5 g/dL (ref 1.5–4.5)
Glucose: 157 mg/dL — ABNORMAL HIGH (ref 65–99)
Potassium: 4.6 mmol/L (ref 3.5–5.2)
Sodium: 140 mmol/L (ref 134–144)
Total Protein: 7.5 g/dL (ref 6.0–8.5)

## 2019-06-14 LAB — CBC WITH DIFFERENTIAL/PLATELET
Basophils Absolute: 0.1 10*3/uL (ref 0.0–0.2)
Basos: 2 %
EOS (ABSOLUTE): 0.2 10*3/uL (ref 0.0–0.4)
Eos: 4 %
Hematocrit: 36.6 % (ref 34.0–46.6)
Hemoglobin: 12.3 g/dL (ref 11.1–15.9)
Immature Grans (Abs): 0 10*3/uL (ref 0.0–0.1)
Immature Granulocytes: 0 %
Lymphocytes Absolute: 2.4 10*3/uL (ref 0.7–3.1)
Lymphs: 44 %
MCH: 29.7 pg (ref 26.6–33.0)
MCHC: 33.6 g/dL (ref 31.5–35.7)
MCV: 88 fL (ref 79–97)
Monocytes Absolute: 0.5 10*3/uL (ref 0.1–0.9)
Monocytes: 10 %
Neutrophils Absolute: 2.2 10*3/uL (ref 1.4–7.0)
Neutrophils: 40 %
Platelets: 201 10*3/uL (ref 150–450)
RBC: 4.14 x10E6/uL (ref 3.77–5.28)
RDW: 13.6 % (ref 11.7–15.4)
WBC: 5.4 10*3/uL (ref 3.4–10.8)

## 2019-06-14 LAB — LIPID PANEL
Chol/HDL Ratio: 4.5 ratio — ABNORMAL HIGH (ref 0.0–4.4)
Cholesterol, Total: 268 mg/dL — ABNORMAL HIGH (ref 100–199)
HDL: 60 mg/dL (ref >39)
LDL Chol Calc (NIH): 163 mg/dL — ABNORMAL HIGH (ref 0–99)
Triglycerides: 243 mg/dL — ABNORMAL HIGH (ref 0–149)
VLDL Cholesterol Cal: 45 mg/dL — ABNORMAL HIGH (ref 5–40)

## 2019-06-14 LAB — HEMOGLOBIN A1C
Est. average glucose Bld gHb Est-mCnc: 137 mg/dL
Hgb A1c MFr Bld: 6.4 % — ABNORMAL HIGH (ref 4.8–5.6)

## 2019-06-14 LAB — TSH: TSH: 2.39 u[IU]/mL (ref 0.450–4.500)

## 2019-06-17 DIAGNOSIS — Z1211 Encounter for screening for malignant neoplasm of colon: Secondary | ICD-10-CM | POA: Diagnosis not present

## 2019-06-20 LAB — FECAL OCCULT BLOOD, IMMUNOCHEMICAL: Fecal Occult Bld: POSITIVE — AB

## 2019-06-24 ENCOUNTER — Other Ambulatory Visit: Payer: Self-pay | Admitting: Internal Medicine

## 2019-06-24 DIAGNOSIS — Z1211 Encounter for screening for malignant neoplasm of colon: Secondary | ICD-10-CM

## 2019-06-24 DIAGNOSIS — G4733 Obstructive sleep apnea (adult) (pediatric): Secondary | ICD-10-CM | POA: Diagnosis not present

## 2019-06-24 NOTE — Progress Notes (Signed)
Patient informed. Agrees to colonoscopy. Needs referral. No place preferred.

## 2019-06-27 ENCOUNTER — Telehealth: Payer: Self-pay

## 2019-06-27 ENCOUNTER — Other Ambulatory Visit: Payer: Self-pay

## 2019-06-27 DIAGNOSIS — Z1211 Encounter for screening for malignant neoplasm of colon: Secondary | ICD-10-CM

## 2019-06-27 NOTE — Telephone Encounter (Signed)
Gastroenterology Pre-Procedure Review  Request Date: Monday 07/22/19 Requesting Physician: Dr. Allen Norris  PATIENT REVIEW QUESTIONS: The patient responded to the following health history questions as indicated:    1. Are you having any GI issues?no 2. Do you have a personal history of Polyps? no 3. Do you have a family history of Colon Cancer or Polyps?no 4. Diabetes Mellitus? yes 5. Joint replacements in the past 12 months?no 6. Major health problems in the past 3 months?no 7. Any artificial heart valves, MVP, or defibrillator?no    MEDICATIONS & ALLERGIES:    Patient reports the following regarding taking any anticoagulation/antiplatelet therapy:   Plavix, Coumadin, Eliquis, Xarelto, Lovenox, Pradaxa, Brilinta, or Effient? no Aspirin?no  Patient confirms/reports the following medications:  Current Outpatient Medications  Medication Sig Dispense Refill  . acetaminophen (TYLENOL) 500 MG tablet Take 2 tablets (1,000 mg total) by mouth 2 (two) times daily. 30 tablet 0  . amLODipine (NORVASC) 5 MG tablet Take 1 tablet by mouth once daily 90 tablet 0  . betamethasone dipropionate 0.05 % lotion Apply topically 2 (two) times daily as needed.    [name] Kitchen buPROPion (WELLBUTRIN XL) 150 MG 24 hr tablet Take 1 tablet by mouth once daily 90 tablet 3  . CALCIUM CITRATE-VITAMIN D PO Take by mouth daily.    . cetirizine (ZYRTEC) 10 MG tablet Take 10 mg by mouth daily as needed for allergies.    [name] Kitchen glucose blood (ONETOUCH ULTRA) test strip Test Blood Sugar twice daily. 100 each 12  . irbesartan (AVAPRO) 300 MG tablet Take 1 tablet by mouth once daily 90 tablet 3  . Lancets (ONETOUCH ULTRASOFT) lancets USE AS DIRECTED TO CHECK BLOOD SUGAR DAILY 100 each 0  . NON FORMULARY CPAP @@ 12 cm H20    . polyethylene glycol powder (GLYCOLAX/MIRALAX) 17 GM/SCOOP powder Take 17 g by mouth 2 (two) times daily as needed for moderate constipation. 1020 g 1   No current facility-administered medications for this visit.     Patient confirms/reports the following allergies:  Allergies  Allergen Reactions  . Atorvastatin Other (See Comments)    Peripheral neuropathy  . Influenza Vaccines Rash    No orders of the defined types were placed in this encounter.   AUTHORIZATION INFORMATION Primary Insurance: 1D#: Group #:  Secondary Insurance: 1D#: Group #:  SCHEDULE INFORMATION: Date: Monday 07/22/19 Time: Location:[facility]

## 2019-07-04 ENCOUNTER — Other Ambulatory Visit: Payer: Self-pay | Admitting: Internal Medicine

## 2019-07-04 ENCOUNTER — Ambulatory Visit: Payer: Medicare HMO | Attending: Internal Medicine

## 2019-07-04 DIAGNOSIS — Z23 Encounter for immunization: Secondary | ICD-10-CM | POA: Insufficient documentation

## 2019-07-04 NOTE — Progress Notes (Signed)
   Covid-19 Vaccination Clinic  Name:  Molly Ruiz    MRN: [id number] DOB: 1941-02-09  07/04/2019  [name] was observed post Covid-19 immunization for 15 minutes without incidence. She was provided with Vaccine Information Sheet and instruction to access the V-Safe system.   [name] was instructed to call 911 with any severe reactions post vaccine: [name] Kitchen Difficulty breathing  . Swelling of your face and throat  . A fast heartbeat  . A bad rash all over your body  . Dizziness and weakness    Immunizations Administered    Name Date Dose VIS Date Route   Pfizer COVID-19 Vaccine 07/04/2019  9:52 AM 0.3 mL 04/19/2019 Intramuscular   Manufacturer: [place]   Lot: [id number]   [place]: [id number]

## 2019-07-15 ENCOUNTER — Encounter: Payer: Self-pay | Admitting: Gastroenterology

## 2019-07-15 ENCOUNTER — Other Ambulatory Visit: Payer: Self-pay

## 2019-07-16 ENCOUNTER — Telehealth: Payer: Self-pay | Admitting: Gastroenterology

## 2019-07-16 NOTE — Telephone Encounter (Signed)
Pt left vm she has a procedure scheduled for 07/22/19 and would like to get a cheaper clean out prep please call pt

## 2019-07-16 NOTE — Telephone Encounter (Signed)
Patients bowel prep has been changed from SuPrep to Golytely.  Patient has been informed that this rx has been sent to [facility] on [address].  Pt advised to follow mixing instructions for Golytely and begin drinking at 5pm the evening before her procedure- 8 oz every 30 mins until she completes the entire contents.  Thanks,  [place], [place]

## 2019-07-18 ENCOUNTER — Other Ambulatory Visit: Payer: Self-pay

## 2019-07-18 ENCOUNTER — Other Ambulatory Visit
Admission: RE | Admit: 2019-07-18 | Discharge: 2019-07-18 | Disposition: A | Discharge status: Home / Self Care | Payer: Medicare HMO | Source: Ambulatory Visit | Attending: Gastroenterology | Admitting: Gastroenterology

## 2019-07-18 DIAGNOSIS — Z20822 Contact with and (suspected) exposure to covid-19: Secondary | ICD-10-CM | POA: Insufficient documentation

## 2019-07-18 DIAGNOSIS — Z01812 Encounter for preprocedural laboratory examination: Secondary | ICD-10-CM | POA: Diagnosis not present

## 2019-07-18 LAB — SARS CORONAVIRUS 2 (TAT 6-24 HRS): SARS Coronavirus 2: NEGATIVE

## 2019-07-18 NOTE — Discharge Instructions (Signed)
General Anesthesia, Adult, Care After This sheet gives you information about how to care for yourself after your procedure. Your health care provider may also give you more specific instructions. If you have problems or questions, contact your health care provider. What can I expect after the procedure? After the procedure, the following side effects are common:  Pain or discomfort at the IV site.  Nausea.  Vomiting.  Sore throat.  Trouble concentrating.  Feeling cold or chills.  Weak or tired.  Sleepiness and fatigue.  Soreness and body aches. These side effects can affect parts of the body that were not involved in surgery. Follow these instructions at home:  For at least 24 hours after the procedure:  Have a responsible adult stay with you. It is important to have someone help care for you until you are awake and alert.  Rest as needed.  Do not: ? Participate in activities in which you could fall or become injured. ? Drive. ? Use heavy machinery. ? Drink alcohol. ? Take sleeping pills or medicines that cause drowsiness. ? Make important decisions or sign legal documents. ? Take care of children on your own. Eating and drinking  Follow any instructions from your health care provider about eating or drinking restrictions.  When you feel hungry, start by eating small amounts of foods that are soft and easy to digest (bland), such as toast. Gradually return to your regular diet.  Drink enough fluid to keep your urine pale yellow.  If you vomit, rehydrate by drinking water, juice, or clear broth. General instructions  If you have sleep apnea, surgery and certain medicines can increase your risk for breathing problems. Follow instructions from your health care provider about wearing your sleep device: ? Anytime you are sleeping, including during daytime naps. ? While taking prescription pain medicines, sleeping medicines, or medicines that make you drowsy.  Return to  your normal activities as told by your health care provider. Ask your health care provider what activities are safe for you.  Take over-the-counter and prescription medicines only as told by your health care provider.  If you smoke, do not smoke without supervision.  Keep all follow-up visits as told by your health care provider. This is important. Contact a health care provider if:  You have nausea or vomiting that does not get better with medicine.  You cannot eat or drink without vomiting.  You have pain that does not get better with medicine.  You are unable to pass urine.  You develop a skin rash.  You have a fever.  You have redness around your IV site that gets worse. Get help right away if:  You have difficulty breathing.  You have chest pain.  You have blood in your urine or stool, or you vomit blood. Summary  After the procedure, it is common to have a sore throat or nausea. It is also common to feel tired.  Have a responsible adult stay with you for the first 24 hours after general anesthesia. It is important to have someone help care for you until you are awake and alert.  When you feel hungry, start by eating small amounts of foods that are soft and easy to digest (bland), such as toast. Gradually return to your regular diet.  Drink enough fluid to keep your urine pale yellow.  Return to your normal activities as told by your health care provider. Ask your health care provider what activities are safe for you. This information is not   intended to replace advice given to you by your health care provider. Make sure you discuss any questions you have with your health care provider. Document Revised: 04/28/2017 Document Reviewed: 12/09/2016 Elsevier Patient Education  2020 Elsevier Inc.  

## 2019-07-22 ENCOUNTER — Ambulatory Visit
Admission: RE | Admit: 2019-07-22 | Discharge: 2019-07-22 | Disposition: A | Discharge status: Home / Self Care | Payer: Medicare HMO | Source: Ambulatory Visit | Attending: Gastroenterology | Admitting: Gastroenterology

## 2019-07-22 ENCOUNTER — Encounter: Payer: Self-pay | Admitting: Gastroenterology

## 2019-07-22 ENCOUNTER — Encounter
Admission: RE | Disposition: A | Discharge status: Home / Self Care | Payer: Self-pay | Source: Ambulatory Visit | Attending: Gastroenterology

## 2019-07-22 ENCOUNTER — Ambulatory Visit: Payer: Medicare HMO | Admitting: Anesthesiology

## 2019-07-22 DIAGNOSIS — Z823 Family history of stroke: Secondary | ICD-10-CM | POA: Insufficient documentation

## 2019-07-22 DIAGNOSIS — Z9841 Cataract extraction status, right eye: Secondary | ICD-10-CM | POA: Diagnosis not present

## 2019-07-22 DIAGNOSIS — E785 Hyperlipidemia, unspecified: Secondary | ICD-10-CM | POA: Insufficient documentation

## 2019-07-22 DIAGNOSIS — E119 Type 2 diabetes mellitus without complications: Secondary | ICD-10-CM | POA: Insufficient documentation

## 2019-07-22 DIAGNOSIS — G4730 Sleep apnea, unspecified: Secondary | ICD-10-CM | POA: Insufficient documentation

## 2019-07-22 DIAGNOSIS — F329 Major depressive disorder, single episode, unspecified: Secondary | ICD-10-CM | POA: Diagnosis not present

## 2019-07-22 DIAGNOSIS — Z981 Arthrodesis status: Secondary | ICD-10-CM | POA: Insufficient documentation

## 2019-07-22 DIAGNOSIS — D120 Benign neoplasm of cecum: Secondary | ICD-10-CM | POA: Diagnosis not present

## 2019-07-22 DIAGNOSIS — K635 Polyp of colon: Secondary | ICD-10-CM | POA: Diagnosis not present

## 2019-07-22 DIAGNOSIS — Z1211 Encounter for screening for malignant neoplasm of colon: Secondary | ICD-10-CM

## 2019-07-22 DIAGNOSIS — G4733 Obstructive sleep apnea (adult) (pediatric): Secondary | ICD-10-CM | POA: Diagnosis not present

## 2019-07-22 DIAGNOSIS — Z833 Family history of diabetes mellitus: Secondary | ICD-10-CM | POA: Diagnosis not present

## 2019-07-22 DIAGNOSIS — Z8042 Family history of malignant neoplasm of prostate: Secondary | ICD-10-CM | POA: Insufficient documentation

## 2019-07-22 DIAGNOSIS — Z79899 Other long term (current) drug therapy: Secondary | ICD-10-CM | POA: Insufficient documentation

## 2019-07-22 DIAGNOSIS — Z8249 Family history of ischemic heart disease and other diseases of the circulatory system: Secondary | ICD-10-CM | POA: Insufficient documentation

## 2019-07-22 DIAGNOSIS — I10 Essential (primary) hypertension: Secondary | ICD-10-CM | POA: Diagnosis not present

## 2019-07-22 DIAGNOSIS — Z887 Allergy status to serum and vaccine status: Secondary | ICD-10-CM | POA: Diagnosis not present

## 2019-07-22 DIAGNOSIS — D123 Benign neoplasm of transverse colon: Secondary | ICD-10-CM | POA: Insufficient documentation

## 2019-07-22 DIAGNOSIS — Z961 Presence of intraocular lens: Secondary | ICD-10-CM | POA: Insufficient documentation

## 2019-07-22 DIAGNOSIS — D126 Benign neoplasm of colon, unspecified: Secondary | ICD-10-CM | POA: Diagnosis not present

## 2019-07-22 DIAGNOSIS — Z90710 Acquired absence of both cervix and uterus: Secondary | ICD-10-CM | POA: Insufficient documentation

## 2019-07-22 DIAGNOSIS — Z803 Family history of malignant neoplasm of breast: Secondary | ICD-10-CM | POA: Insufficient documentation

## 2019-07-22 DIAGNOSIS — K641 Second degree hemorrhoids: Secondary | ICD-10-CM | POA: Diagnosis not present

## 2019-07-22 DIAGNOSIS — Z888 Allergy status to other drugs, medicaments and biological substances status: Secondary | ICD-10-CM | POA: Insufficient documentation

## 2019-07-22 DIAGNOSIS — R69 Illness, unspecified: Secondary | ICD-10-CM | POA: Diagnosis not present

## 2019-07-22 HISTORY — PX: POLYPECTOMY: SHX5525

## 2019-07-22 HISTORY — PX: COLONOSCOPY WITH PROPOFOL: SHX5780

## 2019-07-22 SURGERY — COLONOSCOPY WITH PROPOFOL
Anesthesia: General | Site: Rectum

## 2019-07-22 MED ORDER — LACTATED RINGERS IV SOLN: INTRAVENOUS | Status: DC

## 2019-07-22 MED ORDER — PROPOFOL 10 MG/ML IV BOLUS
INTRAVENOUS | Status: DC | PRN
  Administered 2019-07-22 (×9): 30 mg via INTRAVENOUS
  Administered 2019-07-22: 110 mg via INTRAVENOUS
  Administered 2019-07-22 (×3): 30 mg via INTRAVENOUS

## 2019-07-22 MED ORDER — LIDOCAINE HCL (CARDIAC) PF 100 MG/5ML IV SOSY
PREFILLED_SYRINGE | INTRAVENOUS | Status: DC | PRN
  Administered 2019-07-22: 30 mg via INTRAVENOUS

## 2019-07-22 MED ORDER — STERILE WATER FOR IRRIGATION IR SOLN
Status: DC | PRN
  Administered 2019-07-22: 50 mL

## 2019-07-22 MED ORDER — ONDANSETRON HCL 4 MG/2ML IJ SOLN: 4.0000 mg | Freq: Once | INTRAMUSCULAR | Status: DC | PRN

## 2019-07-22 SURGICAL SUPPLY — 8 items
CANISTER SUCT 1200ML W/VALVE (MISCELLANEOUS) ×2 IMPLANT
FORCEPS BIOP RAD 4 LRG CAP 4 (CUTTING FORCEPS) IMPLANT
GOWN CVR UNV OPN BCK APRN NK (MISCELLANEOUS) ×2 IMPLANT
GOWN ISOL THUMB LOOP REG UNIV (MISCELLANEOUS) ×2
KIT ENDO PROCEDURE OLY (KITS) ×2 IMPLANT
SNARE SHORT THROW 13M SML OVAL (MISCELLANEOUS) ×2 IMPLANT
TRAP ETRAP POLY (MISCELLANEOUS) ×2 IMPLANT
WATER STERILE IRR 250ML POUR (IV SOLUTION) ×2 IMPLANT

## 2019-07-22 NOTE — Anesthesia Procedure Notes (Signed)
Date/Time: 07/22/2019 8:43 AM Performed by: Cameron Ali, CRNA Pre-anesthesia Checklist: Patient identified, Emergency Drugs available, Suction available, Timeout performed and Patient being monitored Patient Re-evaluated:Patient Re-evaluated prior to induction Oxygen Delivery Method: Nasal cannula Placement Confirmation: positive ETCO2

## 2019-07-22 NOTE — Op Note (Signed)
[facility] Gastroenterology Patient Name: Molly Ruiz Procedure Date: 07/22/2019 8:29 AM MRN: [id number] Account #: [id number] Date of Birth: November 14, 1940 Admit Type: Outpatient Age: 79 Room: [facility] OR ROOM 01 Gender: Female Note Status: Finalized Procedure:             Colonoscopy Indications:           Screening for colorectal malignant neoplasm Providers:             Lucilla Lame MD, MD Referring MD:          [name], MD (Referring MD) Medicines:             Propofol per Anesthesia Complications:         No immediate complications. Procedure:             Pre-Anesthesia Assessment:                        - Prior to the procedure, a History and Physical was                         performed, and patient medications and allergies were                         reviewed. The patient's tolerance of previous                         anesthesia was also reviewed. The risks and benefits                         of the procedure and the sedation options and risks                         were discussed with the patient. All questions were                         answered, and informed consent was obtained. Prior                         Anticoagulants: The patient has taken no previous                         anticoagulant or antiplatelet agents. ASA Grade                         Assessment: II - A patient with mild systemic disease.                         After reviewing the risks and benefits, the patient                         was deemed in satisfactory condition to undergo the                         procedure.                        After obtaining informed consent, the colonoscope was  passed under direct vision. Throughout the procedure,                         the patient's blood pressure, pulse, and oxygen                         saturations were monitored continuously. The was                         introduced through the anus and  advanced to the the                         cecum, identified by appendiceal orifice and ileocecal                         valve. The colonoscopy was performed without                         difficulty. The patient tolerated the procedure well.                         The quality of the bowel preparation was fair. Findings:      The perianal and digital rectal examinations were normal.      A 5 mm polyp was found in the transverse colon. The polyp was sessile.       The polyp was removed with a cold snare. Resection and retrieval were       complete.      A 8 mm polyp was found in the cecum. The polyp was sessile. The polyp       was removed with a cold snare. Resection and retrieval were complete.      Non-bleeding internal hemorrhoids were found during retroflexion. The       hemorrhoids were Grade II (internal hemorrhoids that prolapse but reduce       spontaneously). Impression:            - Preparation of the colon was fair.                        - One 5 mm polyp in the transverse colon, removed with                         a cold snare. Resected and retrieved.                        - One 8 mm polyp in the cecum, removed with a cold                         snare. Resected and retrieved.                        - Non-bleeding internal hemorrhoids. Recommendation:        - Discharge patient to home.                        - Resume previous diet.                        - Continue present medications.                        -  Await pathology results. Procedure Code(s):     --- Professional ---                        [phone], Colonoscopy, flexible; with removal of                         tumor(s), polyp(s), or other lesion(s) by snare                         technique Diagnosis Code(s):     --- Professional ---                        Z12.11, Encounter for screening for malignant neoplasm                         of colon                        K64.1, Second degree hemorrhoids CPT  copyright 2019 American Medical Association. All rights reserved. The codes documented in this report are preliminary and upon coder review may  be revised to meet current compliance requirements. Lucilla Lame MD, MD 07/22/2019 9:14:16 AM This report has been signed electronically. Number of Addenda: 0 Note Initiated On: 07/22/2019 8:29 AM Scope Withdrawal Time: 0 hours 9 minutes 22 seconds  Total Procedure Duration: 0 hours 24 minutes 38 seconds  Estimated Blood Loss:  Estimated blood loss: none.      [facility]

## 2019-07-22 NOTE — Transfer of Care (Signed)
Immediate Anesthesia Transfer of Care Note  Patient: Molly Ruiz  Procedure(s) Performed: COLONOSCOPY WITH BIOPSIES (N/A Rectum) POLYPECTOMY (N/A Rectum)  Patient Location: PACU  Anesthesia Type: General  Level of Consciousness: awake, alert  and patient cooperative  Airway and Oxygen Therapy: Patient Spontanous Breathing and Patient connected to supplemental oxygen  Post-op Assessment: Post-op Vital signs reviewed, Patient's Cardiovascular Status Stable, Respiratory Function Stable, Patent Airway and No signs of Nausea or vomiting  Post-op Vital Signs: Reviewed and stable  Complications: No apparent anesthesia complications

## 2019-07-22 NOTE — Anesthesia Postprocedure Evaluation (Signed)
Anesthesia Post Note  Patient: Molly Ruiz  Procedure(s) Performed: COLONOSCOPY WITH BIOPSIES (N/A Rectum) POLYPECTOMY (N/A Rectum)     Patient location during evaluation: PACU Anesthesia Type: General Level of consciousness: awake and alert Pain management: pain level controlled Vital Signs Assessment: post-procedure vital signs reviewed and stable Respiratory status: spontaneous breathing, nonlabored ventilation, respiratory function stable and patient connected to nasal cannula oxygen Cardiovascular status: blood pressure returned to baseline and stable Postop Assessment: no apparent nausea or vomiting Anesthetic complications: no    Adele Barthel [place]

## 2019-07-22 NOTE — H&P (Signed)
Molly Lame, MD [facility] [address] [place], [place] [address] Phone: [phone] Fax : [phone]  Primary Care Physician:  Glean Hess, MD Primary Gastroenterologist:  Dr. Allen Norris  Pre-Procedure History & Physical: HPI:  Molly Ruiz is a 79 y.o. female is here for a screening colonoscopy.   Past Medical History:  Diagnosis Date  . Complication of anesthesia    Hair falls out.  . Depression   . Diabetes mellitus without complication ([place])   . Hyperlipidemia   . Hypertension   . Sleep apnea    CPAP  . Wears dentures    partial upper    Past Surgical History:  Procedure Laterality Date  . ABDOMINAL HYSTERECTOMY    . BILATERAL CARPAL TUNNEL RELEASE    . CATARACT EXTRACTION W/PHACO Right 11/12/2018   Procedure: CATARACT EXTRACTION PHACO AND INTRAOCULAR LENS PLACEMENT ([place])  RIGHT DIABETIC;  Surgeon: Eulogio Bear, MD;  Location: [place];  Service: Ophthalmology;  Laterality: Right;  Diabetic - diet controlled sleep apnea  . CERVICAL FUSION  2004  . FOOT SURGERY    . [place]    Prior to Admission medications   Medication Sig Start Date End Date Taking? Authorizing Provider  acetaminophen (TYLENOL) 500 MG tablet Take 2 tablets (1,000 mg total) by mouth 2 (two) times daily. 12/20/16  Yes Plonk, Gwyndolyn Saxon, MD  amLODipine (NORVASC) 5 MG tablet Take 1 tablet by mouth once daily 07/04/19  Yes Glean Hess, MD  betamethasone dipropionate 0.05 % lotion Apply topically 2 (two) times daily as needed.   Yes [provider]  buPROPion (WELLBUTRIN XL) 150 MG 24 hr tablet Take 1 tablet by mouth once daily 06/24/19  Yes Glean Hess, MD  CALCIUM CITRATE-VITAMIN D PO Take by mouth daily.   Yes [provider]  irbesartan (AVAPRO) 300 MG tablet Take 1 tablet by mouth once daily 12/22/18  Yes Glean Hess, MD  Lancets [facility] ULTRASOFT) lancets USE AS DIRECTED TO CHECK BLOOD SUGAR DAILY 10/09/18  Yes Glean Hess, MD  NON FORMULARY CPAP @@ 12 cm H20   Yes [provider]  polyethylene glycol powder (GLYCOLAX/MIRALAX) 17 GM/SCOOP powder Take 17 g by mouth 2 (two) times daily as needed for moderate constipation. 02/07/19  Yes Glean Hess, MD  cetirizine (ZYRTEC) 10 MG tablet Take 10 mg by mouth daily as needed for allergies.    [provider]  glucose blood (ONETOUCH ULTRA) test strip Test Blood Sugar twice daily. 10/15/18   Glean Hess, MD    Allergies as of 06/27/2019 - Review Complete 06/13/2019  Allergen Reaction Noted  . Atorvastatin Other (See Comments) 04/26/2018  . Influenza vaccines Rash 06/24/2016    Family History  Problem Relation Age of Onset  . Diabetes Mother   . Stroke Mother   . Heart disease Sister   . Stroke Brother   . Prostate cancer Brother   . Breast cancer Other 26    Social History   Socioeconomic History  . Marital status: Widowed    Spouse name: Not on file  . Number of children: 3  . Years of education: Not on file  . Highest education level: High school graduate  Occupational History  . Occupation: retired  Tobacco Use  . Smoking status: Never Smoker  . Smokeless tobacco: Never Used  Substance and Sexual Activity  . Alcohol use: No  . Drug use: No  . Sexual activity: Not  on file  Other Topics Concern  . Not on file  Social History Narrative  . Not on file   Social Determinants of Health   Financial Resource Strain: Low Risk   . Difficulty of Paying Living Expenses: Not very hard  Food Insecurity: No Food Insecurity  . Worried About Charity fundraiser in the Last Year: Never true  . Ran Out of Food in the Last Year: Never true  Transportation Needs: No Transportation Needs  . Lack of Transportation (Medical): No  . Lack of Transportation (Non-Medical): No  Physical Activity: Inactive  . Days of Exercise per Week: 0 days  . Minutes of Exercise per Session: 0 min  Stress: No Stress Concern Present  .  Feeling of Stress : Only a little  Social Connections: Slightly Isolated  . Frequency of Communication with Friends and Family: More than three times a week  . Frequency of Social Gatherings with Friends and Family: Three times a week  . Attends Religious Services: More than 4 times per year  . Active Member of Clubs or Organizations: Yes  . Attends Archivist Meetings: More than 4 times per year  . Marital Status: Widowed  Intimate Partner Violence: Not At Risk  . Fear of Current or Ex-Partner: No  . Emotionally Abused: No  . Physically Abused: No  . Sexually Abused: No    Review of Systems: See HPI, otherwise negative ROS  Physical Exam: BP (!) 168/72   Pulse 85   Temp (!) 97.3 F (36.3 C) (Temporal)   Resp 16   Ht 5\' 7"  (1.702 m)   Wt 91.6 kg   SpO2 100%   BMI 31.64 kg/m  General:   Alert,  pleasant and cooperative in NAD Head:  Normocephalic and atraumatic. Neck:  Supple; no masses or thyromegaly. Lungs:  Clear throughout to auscultation.    Heart:  Regular rate and rhythm. Abdomen:  Soft, nontender and nondistended. Normal bowel sounds, without guarding, and without rebound.   Neurologic:  Alert and  oriented x4;  grossly normal neurologically.  Impression/Plan: Molly Ruiz is now here to undergo a screening colonoscopy.  Risks, benefits, and alternatives regarding colonoscopy have been reviewed with the patient.  Questions have been answered.  All parties agreeable.

## 2019-07-22 NOTE — Anesthesia Preprocedure Evaluation (Signed)
Anesthesia Evaluation  Patient identified by MRN, date of birth, ID band Patient awake    History of Anesthesia Complications Negative for: history of anesthetic complications  Airway Mallampati: II  TM Distance: >3 FB Neck ROM: Full    Dental  (+) Edentulous Upper, Edentulous Lower   Pulmonary sleep apnea and Continuous Positive Airway Pressure Ventilation ,    Pulmonary exam normal        Cardiovascular Exercise Tolerance: Good hypertension, Pt. on medications Normal cardiovascular exam     Neuro/Psych negative neurological ROS  negative psych ROS   GI/Hepatic negative GI ROS, Neg liver ROS,   Endo/Other  diabetes  Renal/GU negative Renal ROS     Musculoskeletal   Abdominal   Peds  Hematology negative hematology ROS (+)   Anesthesia Other Findings   Reproductive/Obstetrics                           Anesthesia Physical Anesthesia Plan  ASA: II  Anesthesia Plan: General   Post-op Pain Management:    Induction:   PONV Risk Score and Plan: 3 and Propofol infusion, TIVA and Treatment may vary due to age or medical condition  Airway Management Planned: Natural Airway and Simple Face Mask  Additional Equipment: None  Intra-op Plan:   Post-operative Plan:   Informed Consent: I have reviewed the patients History and Physical, chart, labs and discussed the procedure including the risks, benefits and alternatives for the proposed anesthesia with the patient or authorized representative who has indicated his/her understanding and acceptance.       Plan Discussed with: CRNA  Anesthesia Plan Comments:         Anesthesia Quick Evaluation

## 2019-07-23 ENCOUNTER — Encounter: Payer: Self-pay | Admitting: *Deleted

## 2019-07-23 ENCOUNTER — Encounter: Payer: Self-pay | Admitting: Internal Medicine

## 2019-07-23 ENCOUNTER — Encounter: Payer: Self-pay | Admitting: Gastroenterology

## 2019-07-23 LAB — SURGICAL PATHOLOGY

## 2019-07-30 ENCOUNTER — Ambulatory Visit: Payer: Medicare HMO | Attending: Internal Medicine

## 2019-07-30 DIAGNOSIS — Z23 Encounter for immunization: Secondary | ICD-10-CM

## 2019-07-30 NOTE — Progress Notes (Signed)
   Covid-19 Vaccination Clinic  Name:  Molly Ruiz    MRN: [id number] DOB: 1941-04-27  07/30/2019  [name] was observed post Covid-19 immunization for 15 minutes without incident. She was provided with Vaccine Information Sheet and instruction to access the V-Safe system.   [name] was instructed to call 911 with any severe reactions post vaccine: [name] Kitchen Difficulty breathing  . Swelling of face and throat  . A fast heartbeat  . A bad rash all over body  . Dizziness and weakness   Immunizations Administered    Name Date Dose VIS Date Route   Pfizer COVID-19 Vaccine 07/30/2019 11:10 AM 0.3 mL 04/19/2019 Intramuscular   Manufacturer: [employer], [employer]   Lot: [id number]   [place]: [id number]

## 2019-09-19 ENCOUNTER — Other Ambulatory Visit: Payer: Self-pay

## 2019-09-19 ENCOUNTER — Encounter: Payer: Self-pay | Admitting: Internal Medicine

## 2019-09-19 ENCOUNTER — Ambulatory Visit (INDEPENDENT_AMBULATORY_CARE_PROVIDER_SITE_OTHER): Payer: Medicare HMO | Admitting: Internal Medicine

## 2019-09-19 VITALS — BP 128/78 | HR 72 | Temp 97.3°F | Ht 67.0 in | Wt 208.0 lb

## 2019-09-19 DIAGNOSIS — K219 Gastro-esophageal reflux disease without esophagitis: Secondary | ICD-10-CM | POA: Diagnosis not present

## 2019-09-19 DIAGNOSIS — K5901 Slow transit constipation: Secondary | ICD-10-CM

## 2019-09-19 MED ORDER — PANTOPRAZOLE SODIUM 40 MG PO TBEC: 40.0000 mg | DELAYED_RELEASE_TABLET | Freq: Every day | ORAL | 0 refills | Status: DC

## 2019-09-19 NOTE — Progress Notes (Signed)
Date:  09/19/2019   Name:  Molly Ruiz   DOB:  01/14/1941   MRN:  [id number]   Chief Complaint: Gastroesophageal Reflux (X 1 week. Sometimes she has a clear mucous come back up after eating or drinking coffee. Chocolate ice cream comes back up. When laying down she feels the discomfort. Burping more. ) and Constipation (Takes miralax 1 dose in the morning, and 1 at night. Said if she skips this her acid reflux gets worse. )  Gastroesophageal Reflux She complains of belching, heartburn and water brash. She reports no chest pain, no choking, no dysphagia, no nausea or no sore throat. This is a recurrent problem. The current episode started in the past 7 days. The problem occurs frequently. The problem has been gradually worsening. The heartburn is located in the substernum. The symptoms are aggravated by certain foods and lying down. Pertinent negatives include no fatigue. She has tried an antacid (previously took pantoprazole) for the symptoms. The treatment provided mild relief.  Constipation This is a chronic problem. Her stool frequency is 2 to 3 times per week. The stool is described as firm. The patient is not on a high fiber diet. There has been adequate water intake. Pertinent negatives include no fever, nausea or vomiting. Treatments tried: Miralax but only taking it as needed after she feels constipated.    Lab Results  Component Value Date   CREATININE 1.02 (H) 06/13/2019   BUN 14 06/13/2019   NA 140 06/13/2019   K 4.6 06/13/2019   CL 102 06/13/2019   CO2 24 06/13/2019   Lab Results  Component Value Date   CHOL 268 (H) 06/13/2019   HDL 60 06/13/2019   LDLCALC 163 (H) 06/13/2019   TRIG 243 (H) 06/13/2019   CHOLHDL 4.5 (H) 06/13/2019   Lab Results  Component Value Date   TSH 2.390 06/13/2019   Lab Results  Component Value Date   HGBA1C 6.4 (H) 06/13/2019   Lab Results  Component Value Date   WBC 5.4 06/13/2019   HGB 12.3 06/13/2019   HCT 36.6 06/13/2019   MCV  88 06/13/2019   PLT 201 06/13/2019   Lab Results  Component Value Date   ALT 13 06/13/2019   AST 21 06/13/2019   ALKPHOS 109 06/13/2019   BILITOT 0.4 06/13/2019     Review of Systems  Constitutional: Negative for chills, fatigue and fever.  HENT: Negative for sore throat and trouble swallowing.   Respiratory: Negative for choking and shortness of breath.   Cardiovascular: Negative for chest pain.  Gastrointestinal: Positive for constipation and heartburn. Negative for blood in stool, dysphagia, nausea and vomiting.  Neurological: Negative for dizziness and headaches.    Patient Active Problem List   Diagnosis Date Noted  . Special screening for malignant neoplasms, colon   . Tubular adenoma of colon   . Anxiety disorder 06/13/2019  . Osteopenia determined by x-ray 06/06/2018  . Essential hypertension 04/26/2018  . Eczema 10/25/2017  . 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 Osteoarthritis of left hip 01/12/2017  . OSA on CPAP 01/02/2017  . Type II diabetes mellitus with complication ([place]) [id number]  . 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 Hyperlipidemia associated with type 2 diabetes mellitus ([place]) 06/24/2016  . Obesity (BMI 30.0-34.9) 06/24/2016  . Vitamin D deficiency 06/24/2016  . History of shingles 06/24/2016  . GERD (gastroesophageal reflux disease) 06/24/2016  . FH: heart disease 06/24/2016  . Allergic rhinitis 06/24/2016  Allergies  Allergen Reactions  . Atorvastatin Other (See Comments)    Peripheral neuropathy  . Influenza Vaccines Rash    Past Surgical History:  Procedure Laterality Date  . ABDOMINAL HYSTERECTOMY    . BILATERAL CARPAL TUNNEL RELEASE    . CATARACT EXTRACTION W/PHACO Right 11/12/2018   Procedure: CATARACT EXTRACTION PHACO AND INTRAOCULAR LENS PLACEMENT ([place])  RIGHT DIABETIC;  Surgeon: Eulogio Bear, MD;  Location: [facility];  Service: Ophthalmology;  Laterality: Right;  Diabetic - diet  controlled sleep apnea  . CERVICAL FUSION  2004  . COLONOSCOPY WITH PROPOFOL N/A 07/22/2019   Procedure: COLONOSCOPY WITH BIOPSIES;  Surgeon: Lucilla Lame, MD;  Location: [facility];  Service: Endoscopy;  Laterality: N/A;  Diabetic - diet controlled priority 4  . FOOT SURGERY    . [place] SURGERY  1998  . POLYPECTOMY N/A 07/22/2019   Procedure: POLYPECTOMY;  Surgeon: Lucilla Lame, MD;  Location: [facility];  Service: Endoscopy;  Laterality: N/A;    Social History   Tobacco Use  . Smoking status: Never Smoker  . Smokeless tobacco: Never Used  Substance Use Topics  . Alcohol use: No  . Drug use: No     Medication list has been reviewed and updated.  Current Meds  Medication Sig  . acetaminophen (TYLENOL) 500 MG tablet Take 2 tablets (1,000 mg total) by mouth 2 (two) times daily.  [name] Kitchen amLODipine (NORVASC) 5 MG tablet Take 1 tablet by mouth once daily  . betamethasone dipropionate 0.05 % lotion Apply topically 2 (two) times daily as needed.  [name] Kitchen buPROPion (WELLBUTRIN XL) 150 MG 24 hr tablet Take 1 tablet by mouth once daily  . CALCIUM CITRATE-VITAMIN D PO Take by mouth daily.  . cetirizine (ZYRTEC) 10 MG tablet Take 10 mg by mouth daily as needed for allergies.  [name] Kitchen glucose blood (ONETOUCH ULTRA) test strip Test Blood Sugar twice daily.  . irbesartan (AVAPRO) 300 MG tablet Take 1 tablet by mouth once daily  . Lancets (ONETOUCH ULTRASOFT) lancets USE AS DIRECTED TO CHECK BLOOD SUGAR DAILY  . NON FORMULARY CPAP @@ 12 cm H20  . polyethylene glycol powder (GLYCOLAX/MIRALAX) 17 GM/SCOOP powder Take 17 g by mouth 2 (two) times daily as needed for moderate constipation.    PHQ 2/9 Scores 09/19/2019 06/13/2019 05/13/2019 10/26/2018  PHQ - 2 Score 0 0 0 0  PHQ- 9 Score 1 1 - 2    BP Readings from Last 3 Encounters:  09/19/19 128/78  07/22/19 (!) 96/41  06/13/19 122/78    Physical Exam Vitals and nursing note reviewed.  Constitutional:      General: She is not in acute  distress.    Appearance: Normal appearance. She is well-developed.  HENT:     Head: Normocephalic and atraumatic.  Cardiovascular:     Rate and Rhythm: Normal rate and regular rhythm.  Pulmonary:     Effort: Pulmonary effort is normal. No respiratory distress.     Breath sounds: No wheezing or rhonchi.  Abdominal:     General: Abdomen is protuberant. Bowel sounds are normal.     Palpations: Abdomen is soft.     Tenderness: There is abdominal tenderness in the epigastric area. There is no right CVA tenderness, left CVA tenderness, guarding or rebound.  Musculoskeletal:        General: Normal range of motion.     Cervical back: Normal range of motion.  Lymphadenopathy:     Cervical: No cervical adenopathy.  Skin:  General: Skin is warm and dry.     Findings: No rash.  Neurological:     Mental Status: She is alert and oriented to person, place, and time.  Psychiatric:        Behavior: Behavior normal.        Thought Content: Thought content normal.     Wt Readings from Last 3 Encounters:  09/19/19 208 lb (94.3 kg)  07/22/19 202 lb (91.6 kg)  06/13/19 205 lb (93 kg)    BP 128/78   Pulse 72   Temp (!) 97.3 F (36.3 C) (Temporal)   Ht 5\' 7"  (1.702 m)   Wt 208 lb (94.3 kg)   SpO2 98%   BMI 32.58 kg/m   Assessment and Plan: 1. Gastroesophageal reflux disease, unspecified whether esophagitis present Resume PPI daily x 30 days.  Call for refill if needed GERD precautions discussed - pantoprazole (PROTONIX) 40 MG tablet; Take 1 tablet (40 mg total) by mouth daily.  Dispense: 30 tablet; Refill: 0  2. Slow transit constipation Recommend adjusting the daily dose of Miralax to achieve 1-2 stools per day   Partially dictated using Editor, commissioning. Any errors are unintentional.  [name], MD [facility]  09/19/2019

## 2019-10-01 ENCOUNTER — Ambulatory Visit (INDEPENDENT_AMBULATORY_CARE_PROVIDER_SITE_OTHER): Payer: Medicare HMO | Admitting: Internal Medicine

## 2019-10-01 ENCOUNTER — Other Ambulatory Visit: Payer: Self-pay

## 2019-10-01 ENCOUNTER — Encounter: Payer: Self-pay | Admitting: Internal Medicine

## 2019-10-01 VITALS — BP 138/68 | HR 82 | Temp 98.0°F | Ht 67.0 in | Wt 208.0 lb

## 2019-10-01 DIAGNOSIS — H9202 Otalgia, left ear: Secondary | ICD-10-CM

## 2019-10-01 DIAGNOSIS — H6123 Impacted cerumen, bilateral: Secondary | ICD-10-CM

## 2019-10-01 MED ORDER — CIPROFLOXACIN-DEXAMETHASONE 0.3-0.1 % OT SUSP: 4.0000 [drp] | Freq: Two times a day (BID) | OTIC | 0 refills | Status: DC

## 2019-10-01 NOTE — Progress Notes (Signed)
Date:  10/01/2019   Name:  Molly Ruiz   DOB:  30-Aug-1940   MRN:  [id number]   Chief Complaint: Ear Pain (Lt worse than Rt side. Started 2 weeks ago.Left Ear Ache- having to keep cotton in her ear to keep air from going inside and causing the pain .)  Otalgia  There is pain in the left ear. This is a new problem. The current episode started 1 to 4 weeks ago. The problem occurs every few minutes. There has been no fever. The pain is moderate. Pertinent negatives include no coughing, diarrhea, ear discharge, headaches, hearing loss, neck pain, rash or sore throat. Treatments tried: ear wax removal. The treatment provided no relief.    Lab Results  Component Value Date   CREATININE 1.02 (H) 06/13/2019   BUN 14 06/13/2019   NA 140 06/13/2019   K 4.6 06/13/2019   CL 102 06/13/2019   CO2 24 06/13/2019   Lab Results  Component Value Date   CHOL 268 (H) 06/13/2019   HDL 60 06/13/2019   LDLCALC 163 (H) 06/13/2019   TRIG 243 (H) 06/13/2019   CHOLHDL 4.5 (H) 06/13/2019   Lab Results  Component Value Date   TSH 2.390 06/13/2019   Lab Results  Component Value Date   HGBA1C 6.4 (H) 06/13/2019   Lab Results  Component Value Date   WBC 5.4 06/13/2019   HGB 12.3 06/13/2019   HCT 36.6 06/13/2019   MCV 88 06/13/2019   PLT 201 06/13/2019   Lab Results  Component Value Date   ALT 13 06/13/2019   AST 21 06/13/2019   ALKPHOS 109 06/13/2019   BILITOT 0.4 06/13/2019     Review of Systems  Constitutional: Negative for chills, fatigue and fever.  HENT: Positive for ear pain. Negative for ear discharge, hearing loss, postnasal drip, sinus pressure and sore throat.   Respiratory: Negative for cough.   Cardiovascular: Negative for chest pain.  Gastrointestinal: Negative for diarrhea.  Musculoskeletal: Negative for neck pain.  Skin: Negative for rash.  Neurological: Negative for dizziness and headaches.    Patient Active Problem List   Diagnosis Date Noted  .  Gastroesophageal reflux disease 09/19/2019  . Special screening for malignant neoplasms, colon   . Tubular adenoma of colon   . Anxiety disorder 06/13/2019  . Osteopenia determined by x-ray 06/06/2018  . Essential hypertension 04/26/2018  . Eczema 10/25/2017  . 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 Osteoarthritis of left hip 01/12/2017  . OSA on CPAP 01/02/2017  . Type II diabetes mellitus with complication ([place]) XX123456  . 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 Hyperlipidemia associated with type 2 diabetes mellitus ([place]) 06/24/2016  . Obesity (BMI 30.0-34.9) 06/24/2016  . Vitamin D deficiency 06/24/2016  . History of shingles 06/24/2016  . FH: heart disease 06/24/2016  . Allergic rhinitis 06/24/2016    Allergies  Allergen Reactions  . Atorvastatin Other (See Comments)    Peripheral neuropathy  . Influenza Vaccines Rash    Past Surgical History:  Procedure Laterality Date  . ABDOMINAL HYSTERECTOMY    . BILATERAL CARPAL TUNNEL RELEASE    . CATARACT EXTRACTION W/PHACO Right 11/12/2018   Procedure: CATARACT EXTRACTION PHACO AND INTRAOCULAR LENS PLACEMENT ([place])  RIGHT DIABETIC;  Surgeon: Eulogio Bear, MD;  Location: [facility];  Service: Ophthalmology;  Laterality: Right;  Diabetic - diet controlled sleep apnea  . CERVICAL FUSION  2004  . COLONOSCOPY WITH PROPOFOL N/A 07/22/2019  Procedure: COLONOSCOPY WITH BIOPSIES;  Surgeon: Lucilla Lame, MD;  Location: [facility];  Service: Endoscopy;  Laterality: N/A;  Diabetic - diet controlled priority 4  . FOOT SURGERY    . [place] SURGERY  1998  . POLYPECTOMY N/A 07/22/2019   Procedure: POLYPECTOMY;  Surgeon: Lucilla Lame, MD;  Location: [facility];  Service: Endoscopy;  Laterality: N/A;    Social History   Tobacco Use  . Smoking status: Never Smoker  . Smokeless tobacco: Never Used  Substance Use Topics  . Alcohol use: No  . Drug use: No     Medication list  has been reviewed and updated.  Current Meds  Medication Sig  . acetaminophen (TYLENOL) 500 MG tablet Take 2 tablets (1,000 mg total) by mouth 2 (two) times daily.  [name] Kitchen amLODipine (NORVASC) 5 MG tablet Take 1 tablet by mouth once daily  . betamethasone dipropionate 0.05 % lotion Apply topically 2 (two) times daily as needed.  [name] Kitchen buPROPion (WELLBUTRIN XL) 150 MG 24 hr tablet Take 1 tablet by mouth once daily  . CALCIUM CITRATE-VITAMIN D PO Take by mouth daily.  . cetirizine (ZYRTEC) 10 MG tablet Take 10 mg by mouth daily as needed for allergies.  [name] Kitchen glucose blood (ONETOUCH ULTRA) test strip Test Blood Sugar twice daily.  . irbesartan (AVAPRO) 300 MG tablet Take 1 tablet by mouth once daily  . Lancets (ONETOUCH ULTRASOFT) lancets USE AS DIRECTED TO CHECK BLOOD SUGAR DAILY  . NON FORMULARY CPAP @@ 12 cm H20  . pantoprazole (PROTONIX) 40 MG tablet Take 1 tablet (40 mg total) by mouth daily.  . polyethylene glycol powder (GLYCOLAX/MIRALAX) 17 GM/SCOOP powder Take 17 g by mouth 2 (two) times daily as needed for moderate constipation.    PHQ 2/9 Scores 10/01/2019 09/19/2019 06/13/2019 05/13/2019  PHQ - 2 Score 0 0 0 0  PHQ- 9 Score 1 1 1  -    BP Readings from Last 3 Encounters:  10/01/19 138/68  09/19/19 128/78  07/22/19 (!) 96/41    Physical Exam Vitals and nursing note reviewed.  Constitutional:      General: She is not in acute distress.    Appearance: She is well-developed.  HENT:     Head: Normocephalic and atraumatic.     Right Ear: Hearing normal. There is impacted cerumen.     Left Ear:  No middle ear effusion. Tympanic membrane is retracted. Tympanic membrane is not erythematous.     Ears:     Comments: Partial cerumen obstruction on left Cardiovascular:     Rate and Rhythm: Normal rate and regular rhythm.  Pulmonary:     Effort: Pulmonary effort is normal. No respiratory distress.     Breath sounds: No wheezing or rhonchi.  Musculoskeletal:        General: Normal range of  motion.     Cervical back: Normal range of motion.  Lymphadenopathy:     Cervical: No cervical adenopathy.  Skin:    General: Skin is warm and dry.     Findings: No rash.  Neurological:     Mental Status: She is alert and oriented to person, place, and time.  Psychiatric:        Behavior: Behavior normal.        Thought Content: Thought content normal.     Wt Readings from Last 3 Encounters:  10/01/19 208 lb (94.3 kg)  09/19/19 208 lb (94.3 kg)  07/22/19 202 lb (91.6 kg)    BP 138/68  Pulse 82   Temp 98 F (36.7 C) (Oral)   Ht 5\' 7"  (1.702 m)   Wt 208 lb (94.3 kg)   SpO2 99%   BMI 32.58 kg/m   Assessment and Plan: 1. Otalgia of left ear Will try eye drops; continue cotton for protection Refer to ENT - Ambulatory referral to ENT - ciprofloxacin-dexamethasone (CIPRODEX) OTIC suspension; Place 4 drops into the left ear 2 (two) times daily.  Dispense: 7.5 mL; Refill: 0  2. Excessive cerumen in both ear canals - Ambulatory referral to ENT   Partially dictated using Dragon software. Any errors are unintentional.  [name], MD [facility]  10/01/2019

## 2019-10-03 DIAGNOSIS — H6123 Impacted cerumen, bilateral: Secondary | ICD-10-CM | POA: Diagnosis not present

## 2019-10-03 DIAGNOSIS — H9209 Otalgia, unspecified ear: Secondary | ICD-10-CM | POA: Diagnosis not present

## 2019-10-03 DIAGNOSIS — G501 Atypical facial pain: Secondary | ICD-10-CM | POA: Diagnosis not present

## 2019-10-21 DIAGNOSIS — G4733 Obstructive sleep apnea (adult) (pediatric): Secondary | ICD-10-CM | POA: Diagnosis not present

## 2019-10-25 DIAGNOSIS — E119 Type 2 diabetes mellitus without complications: Secondary | ICD-10-CM | POA: Diagnosis not present

## 2019-10-25 LAB — HM DIABETES EYE EXAM

## 2019-11-20 ENCOUNTER — Ambulatory Visit: Payer: Self-pay

## 2019-11-20 DIAGNOSIS — G4733 Obstructive sleep apnea (adult) (pediatric): Secondary | ICD-10-CM | POA: Diagnosis not present

## 2019-11-20 NOTE — Telephone Encounter (Signed)
Called pt told her not to take another blood pressure pill. Advised her to go to UC because she is feeling light headed pt verbalized understanding.  KP

## 2019-11-20 NOTE — Telephone Encounter (Signed)
Pt. Reports her BP is elevated today and she is lightheaded. BP 158/85. Wants to know if she should take "an extra blood pressure pill." Please advise pt. No other symptoms.  Answer Assessment - Initial Assessment Questions 1. BLOOD PRESSURE: "What is the blood pressure?" "Did you take at least two measurements 5 minutes apart?"     158/85 2. ONSET: "When did you take your blood pressure?"     This morning 3. HOW: "How did you obtain the blood pressure?" (e.g., visiting nurse, automatic home BP monitor)     Home monitor 4. HISTORY: "Do you have a history of high blood pressure?"     Yes 5. MEDICATIONS: "Are you taking any medications for blood pressure?" "Have you missed any doses recently?"     No missed doses 6. OTHER SYMPTOMS: "Do you have any symptoms?" (e.g., headache, chest pain, blurred vision, difficulty breathing, weakness)     Lightheaded 7. PREGNANCY: "Is there any chance you are pregnant?" "When was your last menstrual period?"     No  Protocols used: BLOOD PRESSURE - HIGH-A-AH

## 2019-11-25 ENCOUNTER — Other Ambulatory Visit: Payer: Self-pay

## 2019-11-25 ENCOUNTER — Encounter: Payer: Self-pay | Admitting: Internal Medicine

## 2019-11-25 ENCOUNTER — Ambulatory Visit (INDEPENDENT_AMBULATORY_CARE_PROVIDER_SITE_OTHER): Payer: Medicare HMO | Admitting: Internal Medicine

## 2019-11-25 VITALS — BP 134/68 | HR 78 | Temp 97.7°F | Ht 67.0 in | Wt 212.0 lb

## 2019-11-25 DIAGNOSIS — Z1159 Encounter for screening for other viral diseases: Secondary | ICD-10-CM | POA: Diagnosis not present

## 2019-11-25 DIAGNOSIS — I10 Essential (primary) hypertension: Secondary | ICD-10-CM | POA: Diagnosis not present

## 2019-11-25 DIAGNOSIS — R002 Palpitations: Secondary | ICD-10-CM | POA: Diagnosis not present

## 2019-11-25 DIAGNOSIS — K219 Gastro-esophageal reflux disease without esophagitis: Secondary | ICD-10-CM | POA: Diagnosis not present

## 2019-11-25 DIAGNOSIS — E118 Type 2 diabetes mellitus with unspecified complications: Secondary | ICD-10-CM | POA: Diagnosis not present

## 2019-11-25 MED ORDER — LANCETS 28G MISC: 1.0000 | Freq: Every day | 3 refills | Status: DC

## 2019-11-25 NOTE — Progress Notes (Signed)
Date:  11/25/2019   Name:  Molly Ruiz   DOB:  22-May-1940   MRN:  [id number]   Chief Complaint: Hypertension (last reading 142/75 this morning, was feeling light headed last week ), Diabetes (last reading 152 this morning), and Medication Problem (protonix- didnt work for her )  Mammogram: 03/2019 DEXA: 05/2018 Pap smear: discontinued Colonoscopy: 07/2019  Immunization History  Administered Date(s) Administered  . PFIZER SARS-COV-2 Vaccination 07/04/2019, 07/30/2019  . Pneumococcal Conjugate-13 06/24/2016  . Pneumococcal Polysaccharide-23 08/23/2017  . Tdap 06/24/2016    Diabetes She presents for her follow-up diabetic visit. She has type 2 diabetes mellitus. Her disease course has been improving. Pertinent negatives for hypoglycemia include no headaches or tremors. Pertinent negatives for diabetes include no chest pain, no fatigue, no polydipsia and no polyuria. Current diabetic treatment includes diet. She is compliant with treatment most of the time. Her weight is stable. An ACE inhibitor/angiotensin II receptor blocker is being taken. Eye exam is not current.  Hypertension This is a chronic problem. The problem is controlled (occasionally at home 150 - three times). Associated symptoms include palpitations. Pertinent negatives include no chest pain, headaches or shortness of breath. Past treatments include angiotensin blockers and calcium channel blockers. The current treatment provides significant improvement.  Hyperlipidemia This is a chronic problem. The problem is uncontrolled. Pertinent negatives include no chest pain or shortness of breath. She is currently on no antihyperlipidemic treatment.  Gastroesophageal Reflux She complains of heartburn. She reports no abdominal pain, no chest pain or no coughing. Pertinent negatives include no fatigue. She has tried a PPI for the symptoms. The treatment provided significant relief.    Lab Results  Component Value Date    CREATININE 1.02 (H) 06/13/2019   BUN 14 06/13/2019   NA 140 06/13/2019   K 4.6 06/13/2019   CL 102 06/13/2019   CO2 24 06/13/2019   Lab Results  Component Value Date   CHOL 268 (H) 06/13/2019   HDL 60 06/13/2019   LDLCALC 163 (H) 06/13/2019   TRIG 243 (H) 06/13/2019   CHOLHDL 4.5 (H) 06/13/2019   Lab Results  Component Value Date   TSH 2.390 06/13/2019   Lab Results  Component Value Date   HGBA1C 6.4 (H) 06/13/2019   Lab Results  Component Value Date   WBC 5.4 06/13/2019   HGB 12.3 06/13/2019   HCT 36.6 06/13/2019   MCV 88 06/13/2019   PLT 201 06/13/2019   Lab Results  Component Value Date   ALT 13 06/13/2019   AST 21 06/13/2019   ALKPHOS 109 06/13/2019   BILITOT 0.4 06/13/2019     Review of Systems  Constitutional: Negative for appetite change, fatigue, fever and unexpected weight change.  HENT: Negative for tinnitus and trouble swallowing.   Eyes: Negative for visual disturbance.  Respiratory: Negative for cough, chest tightness and shortness of breath.   Cardiovascular: Positive for palpitations. Negative for chest pain and leg swelling.  Gastrointestinal: Positive for heartburn. Negative for abdominal pain.       Gerd  Endocrine: Negative for polydipsia and polyuria.  Genitourinary: Negative for dysuria and hematuria.  Musculoskeletal: Negative for arthralgias.  Neurological: Negative for tremors, numbness and headaches.  Psychiatric/Behavioral: Negative for dysphoric mood.    Patient Active Problem List   Diagnosis Date Noted  . Gastroesophageal reflux disease 09/19/2019  . Special screening for malignant neoplasms, colon   . Tubular adenoma of colon   . Anxiety disorder 06/13/2019  . Osteopenia determined by  x-ray 06/06/2018  . Essential hypertension 04/26/2018  . Eczema 10/25/2017  . 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 Osteoarthritis of left hip 01/12/2017  . OSA on CPAP 01/02/2017  . Type II diabetes mellitus with complication ([place])  59/56/3875  . 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 Hyperlipidemia associated with type 2 diabetes mellitus ([place]) 06/24/2016  . Obesity (BMI 30.0-34.9) 06/24/2016  . Vitamin D deficiency 06/24/2016  . History of shingles 06/24/2016  . FH: heart disease 06/24/2016  . Allergic rhinitis 06/24/2016    Allergies  Allergen Reactions  . Atorvastatin Other (See Comments)    Peripheral neuropathy  . Influenza Vaccines Rash    Past Surgical History:  Procedure Laterality Date  . ABDOMINAL HYSTERECTOMY    . BILATERAL CARPAL TUNNEL RELEASE    . CATARACT EXTRACTION W/PHACO Right 11/12/2018   Procedure: CATARACT EXTRACTION PHACO AND INTRAOCULAR LENS PLACEMENT ([place])  RIGHT DIABETIC;  Surgeon: Eulogio Bear, MD;  Location: [facility];  Service: Ophthalmology;  Laterality: Right;  Diabetic - diet controlled sleep apnea  . CERVICAL FUSION  2004  . COLONOSCOPY WITH PROPOFOL N/A 07/22/2019   Procedure: COLONOSCOPY WITH BIOPSIES;  Surgeon: Lucilla Lame, MD;  [facility];  Service: Endoscopy;  Laterality: N/A;  Diabetic - diet controlled priority 4  . FOOT SURGERY    . [place] SURGERY  1998  . POLYPECTOMY N/A 07/22/2019   Procedure: POLYPECTOMY;  Surgeon: Lucilla Lame, MD;  Location: [facility];  Service: Endoscopy;  Laterality: N/A;    Social History   Tobacco Use  . Smoking status: Never Smoker  . Smokeless tobacco: Never Used  Vaping Use  . Vaping Use: Never used  Substance Use Topics  . Alcohol use: No  . Drug use: No     Medication list has been reviewed and updated.  Current Meds  Medication Sig  . acetaminophen (TYLENOL) 500 MG tablet Take 2 tablets (1,000 mg total) by mouth 2 (two) times daily.  [name] Kitchen amLODipine (NORVASC) 5 MG tablet Take 1 tablet by mouth once daily  . betamethasone dipropionate 0.05 % lotion Apply topically 2 (two) times daily as needed.  [name] Kitchen buPROPion (WELLBUTRIN XL) 150 MG 24 hr tablet Take 1  tablet by mouth once daily  . CALCIUM CITRATE-VITAMIN D PO Take by mouth daily.  . cetirizine (ZYRTEC) 10 MG tablet Take 10 mg by mouth daily as needed for allergies.  [name] Kitchen glucose blood (ONETOUCH ULTRA) test strip Test Blood Sugar twice daily.  . irbesartan (AVAPRO) 300 MG tablet Take 1 tablet by mouth once daily  . Lancets (ONETOUCH ULTRASOFT) lancets USE AS DIRECTED TO CHECK BLOOD SUGAR DAILY  . NON FORMULARY CPAP @@ 12 cm H20  . polyethylene glycol powder (GLYCOLAX/MIRALAX) 17 GM/SCOOP powder Take 17 g by mouth 2 (two) times daily as needed for moderate constipation.  [name] Kitchen REFRESH 1.4-0.6 % SOLN Apply to eye.    PHQ 2/9 Scores 11/25/2019 10/01/2019 09/19/2019 06/13/2019  PHQ - 2 Score 0 0 0 0  PHQ- 9 Score 0 1 1 1     GAD 7 : Generalized Anxiety Score 11/25/2019 10/01/2019 09/19/2019  Nervous, Anxious, on Edge 1 0 0  Control/stop worrying 0 0 0  Worry too much - different things 0 0 0  Trouble relaxing 0 0 0  Restless 0 0 0  Easily annoyed or irritable 0 0 0  Afraid - awful might happen 0 0 0  Total GAD 7 Score 1 0 0  Anxiety  Difficulty Not difficult at all Not difficult at all Not difficult at all    BP Readings from Last 3 Encounters:  11/25/19 134/68  10/01/19 138/68  09/19/19 128/78    Physical Exam Vitals and nursing note reviewed.  Constitutional:      General: She is not in acute distress.    Appearance: She is well-developed.  HENT:     Head: Normocephalic and atraumatic.  Neck:     Vascular: No carotid bruit.  Cardiovascular:     Rate and Rhythm: Normal rate and regular rhythm.     Pulses: Normal pulses.     Heart sounds: No murmur heard.   Pulmonary:     Effort: Pulmonary effort is normal. No respiratory distress.     Breath sounds: No wheezing or rhonchi.  Musculoskeletal:     Cervical back: Normal range of motion.     Right lower leg: No edema.     Left lower leg: No edema.  Lymphadenopathy:     Cervical: No cervical adenopathy.  Skin:    General: Skin is  warm and dry.     Capillary Refill: Capillary refill takes less than 2 seconds.     Findings: No rash.  Neurological:     General: No focal deficit present.     Mental Status: She is alert and oriented to person, place, and time.  Psychiatric:        Mood and Affect: Mood normal.        Behavior: Behavior normal.     Wt Readings from Last 3 Encounters:  11/25/19 212 lb (96.2 kg)  10/01/19 208 lb (94.3 kg)  09/19/19 208 lb (94.3 kg)    BP 134/68   Pulse 78   Temp 97.7 F (36.5 C) (Oral)   Ht 5\' 7"  (1.702 m)   Wt 212 lb (96.2 kg)   SpO2 98%   BMI 33.20 kg/m   Assessment and Plan: 1. Type II diabetes mellitus with complication (HCC) Clinically stable by exam and report without s/s of hypoglycemia. DM complicated by HTN. Tolerating medications well without side effects or other concerns. - Hemoglobin A1c - Comprehensive metabolic panel  2. Essential hypertensio Clinically stable exam with well controlled BP. Tolerating medications without side effects at this time. Pt to continue current regimen and low sodium diet; benefits of regular exercise as able discussed.  3. Need for hepatitis C screening test - Hepatitis C antibody  4. Gastroesophageal reflux disease, unspecified whether esophagitis present Symptoms well controlled on daily PPI No red flag signs such as weight loss, n/v, melena Will continue pantoprazole.  5. Heart palpitations Pt is reassured of the benign nature of these If they become more frequent and sx, then medication can be prescribed EKG is normal.  Will check labs for metabolic causes - Comprehensive metabolic panel - TSH - EKG 12-Lead - SR @ 76; WNL   Partially dictated using Editor, commissioning. Any errors are unintentional.  [name], MD [facility]  11/25/2019

## 2019-11-26 LAB — COMPREHENSIVE METABOLIC PANEL
ALT: 15 IU/L (ref 0–32)
AST: 24 IU/L (ref 0–40)
Albumin/Globulin Ratio: 1.3 (ref 1.2–2.2)
Albumin: 4.2 g/dL (ref 3.7–4.7)
Alkaline Phosphatase: 108 IU/L (ref 48–121)
BUN/Creatinine Ratio: 12 (ref 12–28)
BUN: 12 mg/dL (ref 8–27)
Bilirubin Total: 0.3 mg/dL (ref 0.0–1.2)
CO2: 26 mmol/L (ref 20–29)
Calcium: 9.7 mg/dL (ref 8.7–10.3)
Chloride: 100 mmol/L (ref 96–106)
Creatinine, Ser: 0.99 mg/dL (ref 0.57–1.00)
GFR calc Af Amer: 63 mL/min/{1.73_m2} (ref >59)
GFR calc non Af Amer: 55 mL/min/{1.73_m2} — ABNORMAL LOW (ref >59)
Globulin, Total: 3.3 g/dL (ref 1.5–4.5)
Glucose: 138 mg/dL — ABNORMAL HIGH (ref 65–99)
Potassium: 4.6 mmol/L (ref 3.5–5.2)
Sodium: 140 mmol/L (ref 134–144)
Total Protein: 7.5 g/dL (ref 6.0–8.5)

## 2019-11-26 LAB — HEMOGLOBIN A1C
Est. average glucose Bld gHb Est-mCnc: 157 mg/dL
Hgb A1c MFr Bld: 7.1 % — ABNORMAL HIGH (ref 4.8–5.6)

## 2019-11-26 LAB — HEPATITIS C ANTIBODY: Hep C Virus Ab: <0.1 s/co ratio (ref 0.0–0.9)

## 2019-11-26 LAB — TSH: TSH: 1.73 u[IU]/mL (ref 0.450–4.500)

## 2019-12-12 DIAGNOSIS — G4733 Obstructive sleep apnea (adult) (pediatric): Secondary | ICD-10-CM | POA: Diagnosis not present

## 2019-12-12 DIAGNOSIS — Z008 Encounter for other general examination: Secondary | ICD-10-CM | POA: Diagnosis not present

## 2019-12-12 DIAGNOSIS — R69 Illness, unspecified: Secondary | ICD-10-CM | POA: Diagnosis not present

## 2019-12-12 DIAGNOSIS — Z6832 Body mass index (BMI) 32.0-32.9, adult: Secondary | ICD-10-CM | POA: Diagnosis not present

## 2019-12-12 DIAGNOSIS — K5900 Constipation, unspecified: Secondary | ICD-10-CM | POA: Diagnosis not present

## 2019-12-12 DIAGNOSIS — E1165 Type 2 diabetes mellitus with hyperglycemia: Secondary | ICD-10-CM | POA: Diagnosis not present

## 2019-12-12 DIAGNOSIS — K219 Gastro-esophageal reflux disease without esophagitis: Secondary | ICD-10-CM | POA: Diagnosis not present

## 2019-12-12 DIAGNOSIS — I10 Essential (primary) hypertension: Secondary | ICD-10-CM | POA: Diagnosis not present

## 2019-12-12 DIAGNOSIS — F411 Generalized anxiety disorder: Secondary | ICD-10-CM | POA: Diagnosis not present

## 2019-12-12 DIAGNOSIS — E669 Obesity, unspecified: Secondary | ICD-10-CM | POA: Diagnosis not present

## 2019-12-21 DIAGNOSIS — G4733 Obstructive sleep apnea (adult) (pediatric): Secondary | ICD-10-CM | POA: Diagnosis not present

## 2020-01-29 ENCOUNTER — Other Ambulatory Visit: Payer: Self-pay | Admitting: Internal Medicine

## 2020-01-29 DIAGNOSIS — I10 Essential (primary) hypertension: Secondary | ICD-10-CM

## 2020-02-05 ENCOUNTER — Telehealth: Payer: Self-pay | Admitting: *Deleted

## 2020-02-05 NOTE — Chronic Care Management (AMB) (Signed)
  Chronic Care Management   Note  02/05/2020 Name: Molly Ruiz MRN: [id number] DOB: February 28, 1941  Molly Ruiz is a 79 y.o. year old female who is a primary care patient of Glean Hess, MD. I reached out to [name] by phone today in response to a referral sent by Molly Ruiz's health plan.     Molly Ruiz was given information about Chronic Care Management services today including:  1. CCM service includes personalized support from designated clinical staff supervised by her physician, including individualized plan of care and coordination with other care providers 2. 24/7 contact phone numbers for assistance for urgent and routine care needs. 3. Service will only be billed when office clinical staff spend 20 minutes or more in a month to coordinate care. 4. Only one practitioner may furnish and bill the service in a calendar month. 5. The patient may stop CCM services at any time (effective at the end of the month) by phone call to the office staff. 6. The patient will be responsible for cost sharing (co-pay) of up to 20% of the service fee (after annual deductible is met).  Patient agreed to services and verbal consent obtained.   Follow up plan: Face to Face appointment with care management team member scheduled for: 02/13/2020  [facility]

## 2020-02-12 NOTE — Chronic Care Management (AMB) (Deleted)
Chronic Care Management Pharmacy  Name: Molly Ruiz  MRN: [id number] DOB: 07-18-1940   Chief Complaint/ HPI  Molly Ruiz,  79 y.o. , female presents for their Initial CCM visit with the clinical pharmacist In office.  PCP : Glean Hess, MD Patient Care Team: Glean Hess, MD as PCP - General (Internal Medicine) [facility] (Ophthalmology) Vladimir Faster, [facility] (Pharmacist)  Their chronic conditions include: Hypertension, Diabetes, GERD, Anxiety, Hyperlipidemia  Office Visits: 11/25/19-Dr. Army Melia - bloodwork, ekg SR _0  wnl    Allergies  Allergen Reactions  . Atorvastatin Other (See Comments)    Peripheral neuropathy  . Influenza Vaccines Rash    Medications: Outpatient Encounter Medications as of 02/13/2020  Medication Sig  . acetaminophen (TYLENOL) 500 MG tablet Take 2 tablets (1,000 mg total) by mouth 2 (two) times daily.  [name] Kitchen amLODipine (NORVASC) 5 MG tablet Take 1 tablet by mouth once daily  . betamethasone dipropionate 0.05 % lotion Apply topically 2 (two) times daily as needed.  [name] Kitchen buPROPion (WELLBUTRIN XL) 150 MG 24 hr tablet Take 1 tablet by mouth once daily  . CALCIUM CITRATE-VITAMIN D PO Take by mouth daily.  . cetirizine (ZYRTEC) 10 MG tablet Take 10 mg by mouth daily as needed for allergies.  . ciprofloxacin-dexamethasone (CIPRODEX) OTIC suspension Place 4 drops into the left ear 2 (two) times daily. (Patient not taking: Reported on 11/25/2019)  . glucose blood (ONETOUCH ULTRA) test strip Test Blood Sugar twice daily.  . irbesartan (AVAPRO) 300 MG tablet Take 1 tablet by mouth once daily  . Lancets 28G MISC 1 each by Does not apply route daily.  . NON FORMULARY CPAP @@ 12 cm H20  . pantoprazole (PROTONIX) 40 MG tablet Take 1 tablet (40 mg total) by mouth daily. (Patient not taking: Reported on 11/25/2019)  . polyethylene glycol powder (GLYCOLAX/MIRALAX) 17 GM/SCOOP powder Take 17 g by mouth 2 (two) times daily as needed for moderate  constipation.  [name] Kitchen REFRESH 1.4-0.6 % SOLN Apply to eye.   No facility-administered encounter medications on file as of 02/13/2020.    Wt Readings from Last 3 Encounters:  11/25/19 212 lb (96.2 kg)  10/01/19 208 lb (94.3 kg)  09/19/19 208 lb (94.3 kg)    Current Diagnosis/Assessment:    Goals Addressed   None    Diabetes   A1c goal <7%  Recent Relevant Labs: Lab Results  Component Value Date/Time   HGBA1C 7.1 (H) 11/25/2019 10:46 AM   HGBA1C 6.4 (H) 06/13/2019 09:53 AM    Last diabetic Eye exam:  Lab Results  Component Value Date/Time   HMDIABEYEEXA No Retinopathy 05/11/2018 12:00 AM    Last diabetic Foot exam: No results found for: HMDIABFOOTEX   Checking BG: Daily  Recent FBG Readings: 140-160s  Recent pre-meal BG readings: *** Recent 2hr PP BG readings:  *** Recent HS BG readings: ***  Patient has failed these meds in past: *** Patient is currently {CHL Controlled/Uncontrolled:[phone]} on the following medications: . ***  We discussed: {CHL HP Upstream Pharmacy discussion:[phone]}  Plan  Continue {CHL HP Upstream Pharmacy Plans:[phone]}  Hypertension   BP goal is:  {CHL HP UPSTREAM Pharmacist BP [phone]}  Office blood pressures are  BP Readings from Last 3 Encounters:  11/25/19 134/68  10/01/19 138/68  09/19/19 128/78   Patient checks BP at home {CHL HP BP Monitoring Frequency:[phone]} Patient home BP readings are ranging: ***  Patient has failed these meds in the past: *** Patient is currently {CHL Controlled/Uncontrolled:956 250 3917}  on the following medications:  . Amlodpipine 5 mg qd . Irbesartan 300 mg qd  We discussed {CHL HP Upstream Pharmacy discussion:[phone]}  Plan  Continue {CHL HP Upstream Pharmacy Plans:[id number]}     Hyperlipidemia   LDL goal < 70  Lipid Panel     Component Value Date/Time   CHOL 268 (H) 06/13/2019 0953   TRIG 243 (H) 06/13/2019 0953   HDL 60 06/13/2019 0953   LDLCALC 163  (H) 06/13/2019 0953    Hepatic Function Latest Ref Rng & Units 11/25/2019 06/13/2019 04/26/2018  Total Protein 6.0 - 8.5 g/dL 7.5 7.5 7.7  Albumin 3.7 - 4.7 g/dL 4.2 4.0 4.3  AST 0 - 40 IU/L _0 ALT 0 - 32 IU/L _1 Alk Phosphatase 48 - 121 IU/L 108 109 116  Total Bilirubin 0.0 - 1.2 mg/dL 0.3 0.4 0.3     The 10-year ASCVD risk score Mikey Bussing DC Jr., et al., 2013) is: 49.3%   Values used to calculate the score:     Age: 19 years     Sex: Female     Is Non-Hispanic African American: Yes     Diabetic: Yes     Tobacco smoker: No     Systolic Blood Pressure: 197 mmHg     Is BP treated: Yes     HDL Cholesterol: 60 mg/dL     Total Cholesterol: 268 mg/dL   Patient has failed these meds in past: Atorvastatin-neuropathy Patient is currently uncontrolled on the following medications:  .   We discussed:  {CHL HP Upstream Pharmacy discussion:[phone]}  Plan  Continue {CHL HP Upstream Pharmacy Plans:[id number]}  GERD   Patient has failed these meds in past: *** Patient is currently {CHL Controlled/Uncontrolled:[phone]} on the following medications:  . Pantoprazole 40 mg qd  We discussed:  ***  Plan  Continue {CHL HP Upstream Pharmacy Plans:[id number]}   Depression / Anxiety   PHQ9 Score:  PHQ9 SCORE ONLY 11/25/2019 10/01/2019 09/19/2019  PHQ-9 Total Score 0 1 1   GAD7 Score: GAD 7 : Generalized Anxiety Score 11/25/2019 10/01/2019 09/19/2019  Nervous, Anxious, on Edge 1 0 0  Control/stop worrying 0 0 0  Worry too much - different things 0 0 0  Trouble relaxing 0 0 0  Restless 0 0 0  Easily annoyed or irritable 0 0 0  Afraid - awful might happen 0 0 0  Total GAD 7 Score 1 0 0  Anxiety Difficulty Not difficult at all Not difficult at all Not difficult at all    Patient has failed these meds in past: *** Patient is currently {CHL Controlled/Uncontrolled:[phone]} on the following medications:  . Bupropion XL 150 mg qd  We discussed:  ***  Plan  Continue  {CHL HP Upstream Pharmacy Plans:[id number]}   Health Maintenance   Patient is currently {CHL Controlled/Uncontrolled:[phone]} on the following medications:  . Acetaminophen 500 mg 2 tabs aas needed . Calcium citrate-Vitamin D . Miralax   We discussed:  ***  Plan  Continue {CHL HP Upstream Pharmacy JOITG:[id number]}     Medication Management   Pt uses [facility] for all medications Uses pill box? {Yes or If no, why not?:20788} Pt endorses ***% compliance  We discussed: {Pharmacy options:24294}  Plan  {US Pharmacy XENM:07680}    Follow up: *** month phone visit  ***

## 2020-02-13 ENCOUNTER — Other Ambulatory Visit: Payer: Self-pay

## 2020-02-13 ENCOUNTER — Ambulatory Visit: Payer: Medicare HMO | Admitting: Pharmacist

## 2020-02-13 DIAGNOSIS — I10 Essential (primary) hypertension: Secondary | ICD-10-CM

## 2020-02-13 DIAGNOSIS — E118 Type 2 diabetes mellitus with unspecified complications: Secondary | ICD-10-CM

## 2020-02-13 NOTE — Chronic Care Management (AMB) (Signed)
Chronic Care Management Pharmacy  Name: Molly Ruiz  MRN: [id number] DOB: May 09, 1941   Chief Complaint/ HPI  Molly Ruiz,  79 y.o. , female presents for their Initial CCM visit with the clinical pharmacist In office.  PCP : Glean Hess, MD Patient Care Team: Glean Hess, MD as PCP - General (Internal Medicine) [facility] (Ophthalmology) Vladimir Faster, [facility] (Pharmacist)  Their chronic conditions include: Hypertension, Diabetes, GERD, Anxiety, Hyperlipidemia and osteopenia  Office Visits: 11/25/19-Dr. Army Melia - bloodwork, ekg SR _0  wnl    Allergies  Allergen Reactions  . Atorvastatin Other (See Comments)    Peripheral neuropathy  . Influenza Vaccines Rash    Medications: Outpatient Encounter Medications as of 02/13/2020  Medication Sig Note  . acetaminophen (TYLENOL) 500 MG tablet Take 2 tablets (1,000 mg total) by mouth 2 (two) times daily.   [name] Kitchen amLODipine (NORVASC) 5 MG tablet Take 1 tablet by mouth once daily 02/13/2020: At night  . buPROPion (WELLBUTRIN XL) 150 MG 24 hr tablet Take 1 tablet by mouth once daily   . CALCIUM CITRATE PO Take 600 mg by mouth daily.   . cholecalciferol (VITAMIN D3) 25 MCG (1000 UNIT) tablet Take 1,000 Units by mouth daily.   [name] Kitchen glucose blood (ONETOUCH ULTRA) test strip Test Blood Sugar twice daily.   . irbesartan (AVAPRO) 300 MG tablet Take 1 tablet by mouth once daily 02/13/2020: Takes at night  . Lancets 28G MISC 1 each by Does not apply route daily.   . NON FORMULARY CPAP @@ 12 cm H20   . Omega-3 Fatty Acids (FISH OIL) 1000 MG CAPS Take 1 capsule by mouth daily.   . polyethylene glycol powder (GLYCOLAX/MIRALAX) 17 GM/SCOOP powder Take 17 g by mouth 2 (two) times daily as needed for moderate constipation.   [name] Kitchen REFRESH 1.4-0.6 % SOLN Apply to eye.   . betamethasone dipropionate 0.05 % lotion Apply topically 2 (two) times daily as needed. (Patient not taking: Reported on 02/13/2020)   . CALCIUM CITRATE-VITAMIN D PO  Take by mouth daily. (Patient not taking: Reported on 02/13/2020)   . cetirizine (ZYRTEC) 10 MG tablet Take 10 mg by mouth daily as needed for allergies. (Patient not taking: Reported on 02/13/2020)   . ciprofloxacin-dexamethasone (CIPRODEX) OTIC suspension Place 4 drops into the left ear 2 (two) times daily. (Patient not taking: Reported on 11/25/2019)   . pantoprazole (PROTONIX) 40 MG tablet Take 1 tablet (40 mg total) by mouth daily. (Patient not taking: Reported on 11/25/2019)    No facility-administered encounter medications on file as of 02/13/2020.    Wt Readings from Last 3 Encounters:  11/25/19 212 lb (96.2 kg)  10/01/19 208 lb (94.3 kg)  09/19/19 208 lb (94.3 kg)    Current Diagnosis/Assessment:    Goals Addressed            This Visit's Progress   . Pharmacy Care Plan       CARE PLAN ENTRY (see longitudinal plan of care for additional care plan information)  Current Barriers:  . Chronic Disease Management support, education, and care coordination needs related to Hypertension, Hyperlipidemia, Diabetes, Anxiety, and Osteopenia   Hypertension BP Readings from Last 3 Encounters:  11/25/19 134/68  10/01/19 138/68  09/19/19 128/78   . Pharmacist Clinical Goal(s): o Over the next 90 days, patient will work with PharmD and providers to maintain BP goal <130/80 . Current regimen:  . Amlodipine 5 mg qd . Irbesartan 300 mg qd . Interventions: .  Comprehensive medication review performed, medication list updated in electronic medical record  . Patient self care activities - Over the next 90 days, patient will: o Check BP daily, document, and provide at future appointments o Ensure daily salt intake < 2300 mg/ o Continue exercising 30 min/day   Hyperlipidemia Lab Results  Component Value Date/Time   LDLCALC 163 (H) 06/13/2019 09:53 AM   . Pharmacist Clinical Goal(s): o Over the next 90 days, patient will work with PharmD and providers to achieve LDL goal <  70  . Current regimen:  o Fish Oil 1000 mg daily  . Interventions: o Discussed options other than statin to help manage cholesterol. Discuss with PCP next visit. . Patient self care activities - Over the next 90 days, patient will: o Continue exercise regimen and diet adherence     Diabetes Lab Results  Component Value Date/Time   HGBA1C 7.1 (H) 11/25/2019 10:46 AM   HGBA1C 6.4 (H) 06/13/2019 09:53 AM   . Pharmacist Clinical Goal(s): o Over the next 90 days, patient will work with PharmD and providers to achieve A1c goal <7% . Current regimen:  o Diet and exercise . Interventions: o Discussed A1c and fasting blood glucose levels.  o Discussed adverse effects of elevated glucose levels . [name] KitchenComprehensive medication review performed, medication list updated in electronic medical record . Patient self care activities - Over the next 90 days, patient will: o Check blood sugar once daily, document, and provide at future appointments o Contact provider with any episodes of hypoglycemia  Medication management . Pharmacist Clinical Goal(s): o Over the next 90 days, patient will work with PharmD and providers to achieve optimal medication adherence . Current pharmacy: [facility] . Interventions o Comprehensive medication review performed. o Continue current medication management strategy . Patient self care activities - Over the next  90 days, patient will: o Focus on medication adherence by fill dates o Take medications as prescribed o Report any questions or concerns to PharmD and/or provider(s)  Initial goal documentation       Diabetes   A1c goal <7%  Recent Relevant Labs: Lab Results  Component Value Date/Time   HGBA1C 7.1 (H) 11/25/2019 10:46 AM   HGBA1C 6.4 (H) 06/13/2019 09:53 AM    Last diabetic Eye exam:  Lab Results  Component Value Date/Time   HMDIABEYEEXA No Retinopathy 05/11/2018 12:00 AM    Last diabetic Foot exam: No results found for: HMDIABFOOTEX    Checking BG: Daily  Recent FBG Readings: 140-160s   Patient has failed these meds in past: NA Patient is currently uncontrolled on the following medications: . NONE  We discussed: Patient wants to attempt to lower with diet and exercise until next appt with PCP. Consider metformin at next appointmentDis  Plan  Continue control with diet and exercise  Hypertension   Kidney Function Lab Results  Component Value Date/Time   CREATININE 0.99 11/25/2019 10:46 AM   CREATININE 1.02 (H) 06/13/2019 09:53 AM   GFRNONAA 55 (L) 11/25/2019 10:46 AM   GFRAA 63 11/25/2019 10:46 AM   K 4.6 11/25/2019 10:46 AM   K 4.6 06/13/2019 09:53 AM   BP goal is:  <130/80  Office blood pressures are  BP Readings from Last 3 Encounters:  11/25/19 134/68  10/01/19 138/68  09/19/19 128/78   Patient checks BP at home daily Patient home BP readings are ranging: 130-140s/ 70s  Patient has failed these meds in the past: NA Patient is currently controlled on the following medications:  .  Amlodipine 5 mg qd . Irbesartan 300 mg qd  We discussed diet and exercise extensively. Patient follows healthy diet. Cooks at home. Rides exercise bike 30 min/day and does strength training moves with 15 lb dumbells.  Plan  Continue current medications     Hyperlipidemia   LDL goal < 70  Lipid Panel     Component Value Date/Time   CHOL 268 (H) 06/13/2019 0953   TRIG 243 (H) 06/13/2019 0953   HDL 60 06/13/2019 0953   LDLCALC 163 (H) 06/13/2019 0953    Hepatic Function Latest Ref Rng & Units 11/25/2019 06/13/2019 04/26/2018  Total Protein 6.0 - 8.5 g/dL 7.5 7.5 7.7  Albumin 3.7 - 4.7 g/dL 4.2 4.0 4.3  AST 0 - 40 IU/L _0 ALT 0 - 32 IU/L _1 Alk Phosphatase 48 - 121 IU/L 108 109 116  Total Bilirubin 0.0 - 1.2 mg/dL 0.3 0.4 0.3     The 10-year ASCVD risk score Mikey Bussing DC Jr., et al., 2013) is: 49.3%   Values used to calculate the score:     Age: 49 years     Sex: Female     Is Non-Hispanic  African American: Yes     Diabetic: Yes     Tobacco smoker: No     Systolic Blood Pressure: 771 mmHg     Is BP treated: Yes     HDL Cholesterol: 60 mg/dL     Total Cholesterol: 268 mg/dL   Patient has failed these meds in past: Atorvastatin-neuropathy, pravastatin and rosuvastatin-myalgias Patient is currently uncontrolled on the following medications:  Fish Oil 1000 mg daily (patient self prescribed)    We discussed:  Patient can not tolerate statin therapy. Describes severe muscle pain resulting in weakness and neuropathy from atorvastatin. Failed rechallenge with pravastatin and rosuvastatin. We discussed other options for cholesterol management.   Plan  Recommend consider adding Zetia at next visit.        Osteopenia    Last DEXA Scan: 06/06/18    T-Score total hip: 1.2  T-Score lumbar spine: excluded  T-Score forearm radius: -1.2  10-year probability of major osteoporotic fracture: 1.6%    Vit D, 25-Hydroxy  Date Value Ref Range Status  08/17/2017 25.8 (L) 30.0 - 100.0 ng/mL Final    Comment:    Vitamin D deficiency has been defined by the [place] practice guideline as a level of serum 25-OH vitamin D less than 20 ng/mL (1,2). The Endocrine Society went on to further define vitamin D insufficiency as a level between 21 and 29 ng/mL (2). 1. IOM (Institute of Medicine). 2010. Dietary reference    intakes for calcium and D. [place]: The    [employer]. 2. Holick MF, Binkley , Bischoff-Ferrari HA, et al.    Evaluation, treatment, and prevention of vitamin D    deficiency: an Endocrine Society clinical practice    guideline. JCEM. 2011 Jul; 96(7):1911-30.      Patient has failed these meds in past: NA Patient is currently uncontrolled on the following :  . Calcium Citrate 600 mg daily . Cholecalciferol 1000 units daily  We discussed:  Recommend [id number] units of vitamin D daily. Recommend 1200 mg of  calcium daily from dietary and supplemental sources. Continue weight-bearing and muscle strengthening exercises for building and maintaining bone density.  Plan  Continue current medications and control with diet and exercise. Recommend increasing Calcium dosing to bid. Recommend rechecking vitamin  d level with next lab values.     Depression / Anxiety   PHQ9 Score:  PHQ9 SCORE ONLY 11/25/2019 10/01/2019 09/19/2019  PHQ-9 Total Score 0 1 1   GAD7 Score: GAD 7 : Generalized Anxiety Score 11/25/2019 10/01/2019 09/19/2019  Nervous, Anxious, on Edge 1 0 0  Control/stop worrying 0 0 0  Worry too much - different things 0 0 0  Trouble relaxing 0 0 0  Restless 0 0 0  Easily annoyed or irritable 0 0 0  Afraid - awful might happen 0 0 0  Total GAD 7 Score 1 0 0  Anxiety Difficulty Not difficult at all Not difficult at all Not difficult at all    Patient has failed these meds in Past: NA Patient is currently controlled on the following medications:  . Bupropion XL 150 mg qd  We discussed:  Patient is satisfied with current regimen and denies issues   Plan  Continue current medications      Medication Management   [facility] medications Uses pill box? No - feels unnecessary   Plan  Continue current medication management strategy    Follow up: 3 month phone visit  Junita Push. Kenton Kingfisher PharmD, [facility] [phone]

## 2020-02-14 NOTE — Patient Instructions (Addendum)
Visit Information  It was a pleasure speaking with you today! Thank you for letting me be a part of your care team. Please call with any questions or concerns.  Goals Addressed            This Visit's Progress   . Pharmacy Care Plan       CARE PLAN ENTRY (see longitudinal plan of care for additional care plan information)  Current Barriers:  . Chronic Disease Management support, education, and care coordination needs related to Hypertension, Hyperlipidemia, Diabetes, Anxiety, and Osteopenia   Hypertension BP Readings from Last 3 Encounters:  11/25/19 134/68  10/01/19 138/68  09/19/19 128/78   . Pharmacist Clinical Goal(s): o Over the next 90 days, patient will work with PharmD and providers to maintain BP goal <130/80 . Current regimen:  . Amlodipine 5 mg qd . Irbesartan 300 mg qd . Interventions: . Comprehensive medication review performed, medication list updated in electronic medical record  . Patient self care activities - Over the next 90 days, patient will: o Check BP daily, document, and provide at future appointments o Ensure daily salt intake < 2300 mg/ o Continue exercising 30 min/day   Hyperlipidemia Lab Results  Component Value Date/Time   LDLCALC 163 (H) 06/13/2019 09:53 AM   . Pharmacist Clinical Goal(s): o Over the next 90 days, patient will work with PharmD and providers to achieve LDL goal < 70  . Current regimen:  o Fish Oil 1000 mg daily  . Interventions: o Discussed options other than statin to help manage cholesterol. Discuss with PCP next visit. . Patient self care activities - Over the next 90 days, patient will: o Continue exercise regimen and diet adherence     Diabetes Lab Results  Component Value Date/Time   HGBA1C 7.1 (H) 11/25/2019 10:46 AM   HGBA1C 6.4 (H) 06/13/2019 09:53 AM   . Pharmacist Clinical Goal(s): o Over the next 90 days, patient will work with PharmD and providers to achieve A1c goal <7% . Current regimen:  o Diet  and exercise . Interventions: o Discussed A1c and fasting blood glucose levels.  o Discussed adverse effects of elevated glucose levels . [name] KitchenComprehensive medication review performed, medication list updated in electronic medical record . Patient self care activities - Over the next 90 days, patient will: o Check blood sugar once daily, document, and provide at future appointments o Contact provider with any episodes of hypoglycemia  Medication management . Pharmacist Clinical Goal(s): o Over the next 90 days, patient will work with PharmD and providers to achieve optimal medication adherence . Current pharmacy: [facility] . Interventions o Comprehensive medication review performed. o Continue current medication management strategy . Patient self care activities - Over the next  90 days, patient will: o Focus on medication adherence by fill dates o Take medications as prescribed o Report any questions or concerns to PharmD and/or provider(s)  Initial goal documentation        Ms. Anselmo was given information about Chronic Care Management services today including:  1. CCM service includes personalized support from designated clinical staff supervised by her physician, including individualized plan of care and coordination with other care providers 2. 24/7 contact phone numbers for assistance for urgent and routine care needs. 3. Standard insurance, coinsurance, copays and deductibles apply for chronic care management only during months in which we provide at least 20 minutes of these services. Most insurances cover these services at 100%, however patients may be responsible for any copay, coinsurance and/or  deductible if applicable. This service may help you avoid the need for more expensive face-to-face services. 4. Only one practitioner may furnish and bill the service in a calendar month. 5. The patient may stop CCM services at any time (effective at the end of the month) by phone call  to the office staff.  Patient agreed to services and verbal consent obtained.   The patient verbalized understanding of instructions provided today and agreed to receive a mailed copy of patient instruction and/or educational materials. Telephone follow up appointment with pharmacy team member scheduled for: 2-3 months  Junita Push. Aelyn Stanaland PharmD, BCPS Clinical Pharmacist [phone]  High Cholesterol  High cholesterol is a condition in which the blood has high levels of a white, waxy, fat-like substance (cholesterol). The human body needs small amounts of cholesterol. The liver makes all the cholesterol that the body needs. Extra (excess) cholesterol comes from the food that we eat. Cholesterol is carried from the liver by the blood through the blood vessels. If you have high cholesterol, deposits (plaques) may build up on the walls of your blood vessels (arteries). Plaques make the arteries narrower and stiffer. Cholesterol plaques increase your risk for heart attack and stroke. Work with your health care provider to keep your cholesterol levels in a healthy range. What increases the risk? This condition is more likely to develop in people who:  Eat foods that are high in animal fat (saturated fat) or cholesterol.  Are overweight.  Are not getting enough exercise.  Have a family history of high cholesterol. What are the signs or symptoms? There are no symptoms of this condition. How is this diagnosed? This condition may be diagnosed from the results of a blood test.  If you are older than age 100, your health care provider may check your cholesterol every 4-6 years.  You may be checked more often if you already have high cholesterol or other risk factors for heart disease. The blood test for cholesterol measures:  "Bad" cholesterol (LDL cholesterol). This is the main type of cholesterol that causes heart disease. The desired level for LDL is less than 100.  "Good" cholesterol (HDL  cholesterol). This type helps to protect against heart disease by cleaning the arteries and carrying the LDL away. The desired level for HDL is 60 or higher.  Triglycerides. These are fats that the body can store or burn for energy. The desired number for triglycerides is lower than 150.  Total cholesterol. This is a measure of the total amount of cholesterol in your blood, including LDL cholesterol, HDL cholesterol, and triglycerides. A healthy number is less than 200. How is this treated? This condition is treated with diet changes, lifestyle changes, and medicines. Diet changes  This may include eating more whole grains, fruits, vegetables, nuts, and fish.  This may also include cutting back on red meat and foods that have a lot of added sugar. Lifestyle changes  Changes may include getting at least 40 minutes of aerobic exercise 3 times a week. Aerobic exercises include walking, biking, and swimming. Aerobic exercise along with a healthy diet can help you maintain a healthy weight.  Changes may also include quitting smoking. Medicines  Medicines are usually given if diet and lifestyle changes have failed to reduce your cholesterol to healthy levels.  Your health care provider may prescribe a statin medicine. Statin medicines have been shown to reduce cholesterol, which can reduce the risk of heart disease. Follow these instructions at home: Eating and drinking If  told by your health care provider:  Eat chicken (without skin), fish, veal, shellfish, ground [place] breast, and round or loin cuts of red meat.  Do not eat fried foods or fatty meats, such as hot dogs and salami.  Eat plenty of fruits, such as apples.  Eat plenty of vegetables, such as broccoli, potatoes, and carrots.  Eat beans, peas, and lentils.  Eat grains such as barley, rice, couscous, and bulgur wheat.  Eat pasta without cream sauces.  Use skim or nonfat milk, and eat low-fat or nonfat yogurt and  cheeses.  Do not eat or drink whole milk, cream, ice cream, egg yolks, or hard cheeses.  Do not eat stick margarine or tub margarines that contain trans fats (also called partially hydrogenated oils).  Do not eat saturated tropical oils, such as coconut oil and palm oil.  Do not eat cakes, cookies, crackers, or other baked goods that contain trans fats.  General instructions  Exercise as directed by your health care provider. Increase your activity level with activities such as gardening, walking, and taking the stairs.  Take over-the-counter and prescription medicines only as told by your health care provider.  Do not use any products that contain nicotine or tobacco, such as cigarettes and e-cigarettes. If you need help quitting, ask your health care provider.  Keep all follow-up visits as told by your health care provider. This is important. Contact a health care provider if:  You are struggling to maintain a healthy diet or weight.  You need help to start on an exercise program.  You need help to stop smoking. Get help right away if:  You have chest pain.  You have trouble breathing. This information is not intended to replace advice given to you by your health care provider. Make sure you discuss any questions you have with your health care provider. Document Revised: 04/28/2017 Document Reviewed: 10/24/2015 Elsevier Patient Education  [place].

## 2020-03-04 ENCOUNTER — Other Ambulatory Visit: Payer: Self-pay | Admitting: Internal Medicine

## 2020-03-04 DIAGNOSIS — Z1231 Encounter for screening mammogram for malignant neoplasm of breast: Secondary | ICD-10-CM

## 2020-03-31 ENCOUNTER — Other Ambulatory Visit: Payer: Self-pay

## 2020-03-31 ENCOUNTER — Ambulatory Visit (INDEPENDENT_AMBULATORY_CARE_PROVIDER_SITE_OTHER): Payer: Medicare HMO | Admitting: Internal Medicine

## 2020-03-31 ENCOUNTER — Encounter: Payer: Self-pay | Admitting: Internal Medicine

## 2020-03-31 VITALS — BP 130/66 | HR 73 | Ht 67.0 in | Wt 207.0 lb

## 2020-03-31 DIAGNOSIS — E118 Type 2 diabetes mellitus with unspecified complications: Secondary | ICD-10-CM | POA: Diagnosis not present

## 2020-03-31 DIAGNOSIS — E1169 Type 2 diabetes mellitus with other specified complication: Secondary | ICD-10-CM

## 2020-03-31 DIAGNOSIS — E785 Hyperlipidemia, unspecified: Secondary | ICD-10-CM | POA: Diagnosis not present

## 2020-03-31 DIAGNOSIS — M791 Myalgia, unspecified site: Secondary | ICD-10-CM

## 2020-03-31 DIAGNOSIS — T466X5A Adverse effect of antihyperlipidemic and antiarteriosclerotic drugs, initial encounter: Secondary | ICD-10-CM | POA: Insufficient documentation

## 2020-03-31 DIAGNOSIS — G720 Drug-induced myopathy: Secondary | ICD-10-CM | POA: Insufficient documentation

## 2020-03-31 MED ORDER — EZETIMIBE 10 MG PO TABS: 10.0000 mg | ORAL_TABLET | Freq: Every day | ORAL | 1 refills | Status: DC

## 2020-03-31 NOTE — Progress Notes (Signed)
Date:  03/31/2020   Name:  Molly Ruiz   DOB:  27-Dec-1940   MRN:  [id number]   Chief Complaint: Diabetes (Follow up.) and Hypertension (Follow up.)  Diabetes She presents for her follow-up diabetic visit. She has type 2 diabetes mellitus. Her disease course has been stable. Pertinent negatives for hypoglycemia include no headaches or tremors. Pertinent negatives for diabetes include no chest pain, no fatigue, no polydipsia and no polyuria. Current diabetic treatment includes diet. She is compliant with treatment most of the time. She is following a generally healthy diet. She monitors blood glucose at home 1-2 x per week. Her breakfast blood glucose is taken between 7-8 am. Her breakfast blood glucose range is generally 130-140 mg/dl. Her lunch blood glucose is taken between 11-12 pm. Her lunch blood glucose range is generally 140-180 mg/dl. An ACE inhibitor/angiotensin II receptor blocker is being taken. Eye exam is current.  Hyperlipidemia This is a chronic problem. The problem is uncontrolled. Exacerbating diseases include diabetes. Pertinent negatives include no chest pain or shortness of breath. She is currently on no antihyperlipidemic treatment. Compliance problems include medication side effects (neuropathy from atorvastatin and mylagia from another statin; has never tried Zetia; is taking fish oil).     Lab Results  Component Value Date   CREATININE 0.99 11/25/2019   BUN 12 11/25/2019   NA 140 11/25/2019   K 4.6 11/25/2019   CL 100 11/25/2019   CO2 26 11/25/2019   Lab Results  Component Value Date   CHOL 268 (H) 06/13/2019   HDL 60 06/13/2019   LDLCALC 163 (H) 06/13/2019   TRIG 243 (H) 06/13/2019   CHOLHDL 4.5 (H) 06/13/2019   Lab Results  Component Value Date   TSH 1.730 11/25/2019   Lab Results  Component Value Date   HGBA1C 7.1 (H) 11/25/2019   Lab Results  Component Value Date   WBC 5.4 06/13/2019   HGB 12.3 06/13/2019   HCT 36.6 06/13/2019   MCV 88  06/13/2019   PLT 201 06/13/2019   Lab Results  Component Value Date   ALT 15 11/25/2019   AST 24 11/25/2019   ALKPHOS 108 11/25/2019   BILITOT 0.3 11/25/2019     Review of Systems  Constitutional: Negative for appetite change, fatigue, fever and unexpected weight change.  HENT: Positive for ear pain. Negative for hearing loss, tinnitus and trouble swallowing.   Eyes: Negative for visual disturbance.  Respiratory: Negative for cough, chest tightness and shortness of breath.   Cardiovascular: Negative for chest pain, palpitations and leg swelling.  Gastrointestinal: Negative for abdominal pain.  Endocrine: Negative for polydipsia and polyuria.  Genitourinary: Negative for dysuria and hematuria.  Musculoskeletal: Negative for arthralgias.  Neurological: Negative for tremors, numbness and headaches.  Psychiatric/Behavioral: Negative for dysphoric mood.    Patient Active Problem List   Diagnosis Date Noted  . Gastroesophageal reflux disease 09/19/2019  . Special screening for malignant neoplasms, colon   . Tubular adenoma of colon   . Anxiety disorder 06/13/2019  . Osteopenia determined by x-ray 06/06/2018  . Essential hypertension 04/26/2018  . Eczema 10/25/2017  . 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 Osteoarthritis of left hip 01/12/2017  . OSA on CPAP 01/02/2017  . Type II diabetes mellitus with complication ([place]) 24/82/5003  . 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 Hyperlipidemia associated with type 2 diabetes mellitus ([place]) 06/24/2016  . Obesity (BMI 30.0-34.9) 06/24/2016  . Vitamin D deficiency 06/24/2016  . History of  shingles 06/24/2016  . FH: heart disease 06/24/2016  . Allergic rhinitis 06/24/2016    Allergies  Allergen Reactions  . Atorvastatin Other (See Comments)    Peripheral neuropathy  . Influenza Vaccines Rash    Past Surgical History:  Procedure Laterality Date  . ABDOMINAL HYSTERECTOMY    . BILATERAL CARPAL TUNNEL  RELEASE    . CATARACT EXTRACTION W/PHACO Right 11/12/2018   Procedure: CATARACT EXTRACTION PHACO AND INTRAOCULAR LENS PLACEMENT ([place])  RIGHT DIABETIC;  Surgeon: Eulogio Bear, MD;  Location: [facility];  Service: Ophthalmology;  Laterality: Right;  Diabetic - diet controlled sleep apnea  . CERVICAL FUSION  2004  . COLONOSCOPY WITH PROPOFOL N/A 07/22/2019   Procedure: COLONOSCOPY WITH BIOPSIES;  Surgeon: Lucilla Lame, MD;  Location: [facility];  Service: Endoscopy;  Laterality: N/A;  Diabetic - diet controlled priority 4  . FOOT SURGERY    . [place] SURGERY  1998  . POLYPECTOMY N/A 07/22/2019   Procedure: POLYPECTOMY;  Surgeon: Lucilla Lame, MD;  Location: [facility];  Service: Endoscopy;  Laterality: N/A;    Social History   Tobacco Use  . Smoking status: Never Smoker  . Smokeless tobacco: Never Used  Vaping Use  . Vaping Use: Never used  Substance Use Topics  . Alcohol use: No  . Drug use: No     Medication list has been reviewed and updated.  Current Meds  Medication Sig  . acetaminophen (TYLENOL) 500 MG tablet Take 2 tablets (1,000 mg total) by mouth 2 (two) times daily.  [name] Kitchen amLODipine (NORVASC) 5 MG tablet Take 1 tablet by mouth once daily  . buPROPion (WELLBUTRIN XL) 150 MG 24 hr tablet Take 1 tablet by mouth once daily  . CALCIUM CITRATE PO Take 600 mg by mouth daily.  . cholecalciferol (VITAMIN D3) 25 MCG (1000 UNIT) tablet Take 1,000 Units by mouth daily.  [name] Kitchen glucose blood (ONETOUCH ULTRA) test strip Test Blood Sugar twice daily.  . irbesartan (AVAPRO) 300 MG tablet Take 1 tablet by mouth once daily  . Lancets 28G MISC 1 each by Does not apply route daily.  . NON FORMULARY CPAP @@ 12 cm H20  . Omega-3 Fatty Acids (FISH OIL) 1000 MG CAPS Take 1 capsule by mouth daily.  . polyethylene glycol powder (GLYCOLAX/MIRALAX) 17 GM/SCOOP powder Take 17 g by mouth 2 (two) times daily as needed for moderate constipation.  [name] Kitchen REFRESH 1.4-0.6 % SOLN  Apply to eye.    PHQ 2/9 Scores 03/31/2020 11/25/2019 10/01/2019 09/19/2019  PHQ - 2 Score 0 0 0 0  PHQ- 9 Score 0 0 1 1    GAD 7 : Generalized Anxiety Score 03/31/2020 11/25/2019 10/01/2019 09/19/2019  Nervous, Anxious, on Edge 0 1 0 0  Control/stop worrying 0 0 0 0  Worry too much - different things 0 0 0 0  Trouble relaxing 0 0 0 0  Restless 0 0 0 0  Easily annoyed or irritable 0 0 0 0  Afraid - awful might happen 0 0 0 0  Total GAD 7 Score 0 1 0 0  Anxiety Difficulty Not difficult at all Not difficult at all Not difficult at all Not difficult at all    BP Readings from Last 3 Encounters:  03/31/20 130/66  11/25/19 134/68  10/01/19 138/68    Physical Exam Vitals and nursing note reviewed.  Constitutional:      General: She is not in acute distress.    Appearance: She is well-developed.  HENT:  Head: Normocephalic and atraumatic.     Right Ear: Hearing, tympanic membrane and ear canal normal.     Left Ear: Hearing, tympanic membrane and ear canal normal.     Ears:     Comments: Scant wax in both canals Cardiovascular:     Rate and Rhythm: Normal rate and regular rhythm.     Heart sounds: Normal heart sounds. No murmur heard.   Pulmonary:     Effort: Pulmonary effort is normal. No respiratory distress.  Musculoskeletal:        General: Normal range of motion.     Right lower leg: No edema.     Left lower leg: No edema.  Skin:    General: Skin is warm and dry.     Findings: No rash.  Neurological:     Mental Status: She is alert and oriented to person, place, and time.  Psychiatric:        Attention and Perception: Attention normal.        Mood and Affect: Mood normal.     Wt Readings from Last 3 Encounters:  03/31/20 207 lb (93.9 kg)  11/25/19 212 lb (96.2 kg)  10/01/19 208 lb (94.3 kg)    BP 130/66   Pulse 73   Ht 5\' 7"  (1.702 m)   Wt 207 lb (93.9 kg)   SpO2 97%   BMI 32.42 kg/m   Assessment and Plan: 1. Type II diabetes mellitus with  complication (HCC) Clinically stable by exam and report without s/s of hypoglycemia. DM complicated by HTN. Doing well with diet changes and no medications at this time. - Hemoglobin A1c  2. Myalgia due to statin  3. Hyperlipidemia associated with type 2 diabetes mellitus ([place]) LDL is elevated so will try Zetia and recheck next visit - ezetimibe (ZETIA) 10 MG tablet; Take 1 tablet (10 mg total) by mouth daily.  Dispense: 90 tablet; Refill: 1 - Lipid panel   Partially dictated using Editor, commissioning. Any errors are unintentional.  [name], MD [facility]  03/31/2020

## 2020-04-01 ENCOUNTER — Other Ambulatory Visit: Payer: Self-pay

## 2020-04-01 ENCOUNTER — Ambulatory Visit
Admission: RE | Admit: 2020-04-01 | Discharge: 2020-04-01 | Disposition: A | Discharge status: Home / Self Care | Payer: Medicare HMO | Source: Ambulatory Visit | Attending: Internal Medicine | Admitting: Internal Medicine

## 2020-04-01 DIAGNOSIS — Z1231 Encounter for screening mammogram for malignant neoplasm of breast: Secondary | ICD-10-CM | POA: Diagnosis not present

## 2020-04-01 LAB — HEMOGLOBIN A1C
Est. average glucose Bld gHb Est-mCnc: 154 mg/dL
Hgb A1c MFr Bld: 7 % — ABNORMAL HIGH (ref 4.8–5.6)

## 2020-04-01 LAB — LIPID PANEL
Chol/HDL Ratio: 4.4 ratio (ref 0.0–4.4)
Cholesterol, Total: 271 mg/dL — ABNORMAL HIGH (ref 100–199)
HDL: 61 mg/dL (ref >39)
LDL Chol Calc (NIH): 164 mg/dL — ABNORMAL HIGH (ref 0–99)
Triglycerides: 247 mg/dL — ABNORMAL HIGH (ref 0–149)
VLDL Cholesterol Cal: 46 mg/dL — ABNORMAL HIGH (ref 5–40)

## 2020-04-06 ENCOUNTER — Other Ambulatory Visit: Payer: Self-pay | Admitting: Internal Medicine

## 2020-04-06 DIAGNOSIS — N6320 Unspecified lump in the left breast, unspecified quadrant: Secondary | ICD-10-CM

## 2020-04-06 DIAGNOSIS — R928 Other abnormal and inconclusive findings on diagnostic imaging of breast: Secondary | ICD-10-CM

## 2020-04-17 ENCOUNTER — Ambulatory Visit
Admission: RE | Admit: 2020-04-17 | Discharge: 2020-04-17 | Disposition: A | Discharge status: Home / Self Care | Payer: Medicare HMO | Source: Ambulatory Visit | Attending: Internal Medicine | Admitting: Internal Medicine

## 2020-04-17 ENCOUNTER — Other Ambulatory Visit: Payer: Self-pay

## 2020-04-17 DIAGNOSIS — N6489 Other specified disorders of breast: Secondary | ICD-10-CM | POA: Diagnosis not present

## 2020-04-17 DIAGNOSIS — R928 Other abnormal and inconclusive findings on diagnostic imaging of breast: Secondary | ICD-10-CM | POA: Insufficient documentation

## 2020-04-17 DIAGNOSIS — N6320 Unspecified lump in the left breast, unspecified quadrant: Secondary | ICD-10-CM | POA: Insufficient documentation

## 2020-04-24 DIAGNOSIS — G4733 Obstructive sleep apnea (adult) (pediatric): Secondary | ICD-10-CM | POA: Diagnosis not present

## 2020-05-13 ENCOUNTER — Other Ambulatory Visit: Payer: Self-pay

## 2020-05-13 ENCOUNTER — Ambulatory Visit (INDEPENDENT_AMBULATORY_CARE_PROVIDER_SITE_OTHER): Payer: Medicare HMO

## 2020-05-13 VITALS — BP 122/72 | HR 71 | Temp 98.1°F | Resp 16 | Ht 67.0 in | Wt 208.0 lb

## 2020-05-13 DIAGNOSIS — Z780 Asymptomatic menopausal state: Secondary | ICD-10-CM | POA: Diagnosis not present

## 2020-05-13 DIAGNOSIS — Z0000 Encounter for general adult medical examination without abnormal findings: Secondary | ICD-10-CM | POA: Diagnosis not present

## 2020-05-13 NOTE — Progress Notes (Signed)
Subjective:   Molly Ruiz is a 80 y.o. female who presents for Medicare Annual (Subsequent) preventive examination.  Review of Systems     Cardiac Risk Factors include: advanced age (>58men, >69 women);diabetes mellitus;hypertension;obesity (BMI >30kg/m2);dyslipidemia     Objective:    Today's Vitals   05/13/20 1011  BP: 122/72  Pulse: 71  Resp: 16  Temp: 98.1 F (36.7 C)  TempSrc: Oral  SpO2: 97%  Weight: 208 lb (94.3 kg)  Height: 5\' 7"  (1.702 m)   Body mass index is 32.58 kg/m.  Advanced Directives 05/13/2020 07/22/2019 05/13/2019 04/18/2018 09/26/2016 06/24/2016 04/19/2016  Does Patient Have a Medical Advance Directive? Yes Yes No Yes Yes Yes Yes  Type of 14/04/2016 of [place];Living will Healthcare Power of [place] - Healthcare Power of [place];Living will Healthcare Power of Attorney Living will;Healthcare Power of [place] Power of Attorney  Does patient want to make changes to medical advance directive? - No - Patient declined Yes (MAU/Ambulatory/Procedural Areas - Information given) - - - -  Copy of Healthcare Power of Attorney in Chart? Yes - validated most recent copy scanned in chart (See row information) Yes - validated most recent copy scanned in chart (See row information) - No - copy requested No - copy requested - -    Current Medications (verified) Outpatient Encounter Medications as of 05/13/2020  Medication Sig  . acetaminophen (TYLENOL) 500 MG tablet Take 2 tablets (1,000 mg total) by mouth 2 (two) times daily.  07/11/2020 amLODipine (NORVASC) 5 MG tablet Take 1 tablet by mouth once daily  . buPROPion (WELLBUTRIN XL) 150 MG 24 hr tablet Take 1 tablet by mouth once daily  . CALCIUM CITRATE PO Take 600 mg by mouth daily.  . cholecalciferol (VITAMIN D3) 25 MCG (1000 UNIT) tablet Take 1,000 Units by mouth daily.  [name] Kitchen ezetimibe (ZETIA) 10 MG tablet Take 1 tablet (10 mg total) by mouth daily.  [name] Kitchen glucose blood (ONETOUCH ULTRA) test strip  Test Blood Sugar twice daily.  . irbesartan (AVAPRO) 300 MG tablet Take 1 tablet by mouth once daily  . Lancets 28G MISC 1 each by Does not apply route daily.  . Multiple Vitamins-Minerals (MULTIVITAMIN ADULTS 50+ PO) Take by mouth.  . NON FORMULARY CPAP @@ 12 cm H20  . Omega-3 Fatty Acids (FISH OIL) 1000 MG CAPS Take 1 capsule by mouth daily.  . polyethylene glycol powder (GLYCOLAX/MIRALAX) 17 GM/SCOOP powder Take 17 g by mouth 2 (two) times daily as needed for moderate constipation.  [name] Kitchen REFRESH 1.4-0.6 % SOLN Apply to eye.   No facility-administered encounter medications on file as of 05/13/2020.    Allergies (verified) Atorvastatin and Influenza vaccines   History: Past Medical History:  Diagnosis Date  . Complication of anesthesia    Hair falls out.  . Depression   . Diabetes mellitus without complication (HCC)   . Hyperlipidemia   . Hypertension   . Sleep apnea    CPAP  . Wears dentures    partial upper   Past Surgical History:  Procedure Laterality Date  . ABDOMINAL HYSTERECTOMY    . BILATERAL CARPAL TUNNEL RELEASE    . CATARACT EXTRACTION W/PHACO Right 11/12/2018   Procedure: CATARACT EXTRACTION PHACO AND INTRAOCULAR LENS PLACEMENT (IOC)  RIGHT DIABETIC;  Surgeon: 01/13/2019, MD;  Location: [facility];  Service: Ophthalmology;  Laterality: Right;  Diabetic - diet controlled sleep apnea  . CERVICAL FUSION  2004  . COLONOSCOPY WITH PROPOFOL N/A 07/22/2019  Procedure: COLONOSCOPY WITH BIOPSIES;  Surgeon: Midge Minium, MD;  Location: [facility];  Service: Endoscopy;  Laterality: N/A;  Diabetic - diet controlled priority 4  . FOOT SURGERY    . LUMBAR DISC SURGERY  1998  . POLYPECTOMY N/A 07/22/2019   Procedure: POLYPECTOMY;  Surgeon: Midge Minium, MD;  Location: [facility];  Service: Endoscopy;  Laterality: N/A;   Family History  Problem Relation Age of Onset  . Diabetes Mother   . Stroke Mother   . Heart disease Sister   . Stroke  Brother   . Prostate cancer Brother   . Breast cancer Other 59   Social History   Socioeconomic History  . Marital status: Widowed    Spouse name: Not on file  . Number of children: 3  . Years of education: Not on file  . Highest education level: High school graduate  Occupational History  . Occupation: retired  Tobacco Use  . Smoking status: Never Smoker  . Smokeless tobacco: Never Used  Vaping Use  . Vaping Use: Never used  Substance and Sexual Activity  . Alcohol use: No  . Drug use: No  . Sexual activity: Not on file  Other Topics Concern  . Not on file  Social History Narrative   Pt lives with her daughter   Social Determinants of Health   Financial Resource Strain: Low Risk   . Difficulty of Paying Living Expenses: Not very hard  Food Insecurity: No Food Insecurity  . Worried About Programme researcher, broadcasting/film/video in the Last Year: Never true  . Ran Out of Food in the Last Year: Never true  Transportation Needs: No Transportation Needs  . Lack of Transportation (Medical): No  . Lack of Transportation (Non-Medical): No  Physical Activity: Insufficiently Active  . Days of Exercise per Week: 3 days  . Minutes of Exercise per Session: 30 min  Stress: No Stress Concern Present  . Feeling of Stress : Only a little  Social Connections: Moderately Integrated  . Frequency of Communication with Friends and Family: More than three times a week  . Frequency of Social Gatherings with Friends and Family: Three times a week  . Attends Religious Services: More than 4 times per year  . Active Member of Clubs or Organizations: Yes  . Attends Banker Meetings: More than 4 times per year  . Marital Status: Widowed    Tobacco Counseling Counseling given: Not Answered   Clinical Intake:  Pre-visit preparation completed: Yes  Pain : No/denies pain     BMI - recorded: 32.58 Nutritional Risks: None Diabetes: Yes CBG done?: No Did pt. bring in CBG monitor from home?:  No  How often do you need to have someone help you when you read instructions, pamphlets, or other written materials from your doctor or pharmacy?: 1 - Never  Nutrition Risk Assessment:  Has the patient had any N/V/D within the last 2 months?  No  Does the patient have any non-healing wounds?  No  Has the patient had any unintentional weight loss or weight gain?  No   Diabetes:  Is the patient diabetic?  Yes  If diabetic, was a CBG obtained today?  No  Did the patient bring in their glucometer from home?  No  How often do you monitor your CBG's? Twice weekly per patient.   Financial Strains and Diabetes Management:  Are you having any financial strains with the device, your supplies or your medication? No .  Does  the patient want to be seen by Chronic Care Management for management of their diabetes?  No  Would the patient like to be referred to a Nutritionist or for Diabetic Management?  No   Diabetic Exams:  Diabetic Eye Exam: Completed 10/25/19 negative retinopathy Dr. Edison Pace.   Diabetic Foot Exam: Completed 06/13/19.   Interpreter Needed?: No  Information entered by :: Clemetine Marker LPN   Activities of Daily Living In your present state of health, do you have any difficulty performing the following activities: 05/13/2020 07/22/2019  Hearing? N N  Comment declines hearing aids -  Vision? N N  Difficulty concentrating or making decisions? N N  Walking or climbing stairs? N N  Dressing or bathing? N N  Doing errands, shopping? N -  Preparing Food and eating ? N -  Using the Toilet? N -  In the past six months, have you accidently leaked urine? N -  Do you have problems with loss of bowel control? N -  Managing your Medications? N -  Managing your Finances? N -  Housekeeping or managing your Housekeeping? N -  Some recent data might be hidden    Patient Care Team: Glean Hess, MD as PCP - General (Internal Medicine) [facility] (Ophthalmology) Vladimir Faster, [facility] (Pharmacist)  Indicate any recent Medical Services you may have received from other than [facility] providers in the past year (date may be approximate).     Assessment:   This is a routine wellness examination for Larsen.  Hearing/Vision screen  Hearing Screening   125Hz  250Hz  500Hz  1000Hz  2000Hz  3000Hz  4000Hz  6000Hz  8000Hz   Right ear:           Left ear:           Comments: Pt denies hearing difficulty  Vision Screening Comments: Annual vision screenings at [facility] Dr. Edison Pace  Dietary issues and exercise activities discussed: Current Exercise Habits: Home exercise routine, Type of exercise: Other - see comments (exercise bike), Time (Minutes): 30, Frequency (Times/Week): 3, Weekly Exercise (Minutes/Week): 90, Intensity: Moderate, Exercise limited by: None identified  Goals    . Increase physical activity     Recommend increasing physical activity to at least 3 days per week    . Increase water intake     Recommend to drink 4-5 glasses of water a day.    [name] Kitchen Pharmacy Care Plan     CARE PLAN ENTRY (see longitudinal plan of care for additional care plan information)  Current Barriers:  . Chronic Disease Management support, education, and care coordination needs related to Hypertension, Hyperlipidemia, Diabetes, Anxiety, and Osteopenia   Hypertension BP Readings from Last 3 Encounters:  11/25/19 134/68  10/01/19 138/68  09/19/19 128/78   . Pharmacist Clinical Goal(s): o Over the next 90 days, patient will work with PharmD and providers to maintain BP goal <130/80 . Current regimen:  . Amlodipine 5 mg qd . Irbesartan 300 mg qd . Interventions: . Comprehensive medication review performed, medication list updated in electronic medical record  . Patient self care activities - Over the next 90 days, patient will: o Check BP daily, document, and provide at future appointments o Ensure daily salt intake < 2300 mg/ o Continue exercising 30  min/day   Hyperlipidemia Lab Results  Component Value Date/Time   LDLCALC 163 (H) 06/13/2019 09:53 AM   . Pharmacist Clinical Goal(s): o Over the next 90 days, patient will work with PharmD and providers to achieve LDL goal <  70  . Current regimen:  o Fish Oil 1000 mg daily  . Interventions: o Discussed options other than statin to help manage cholesterol. Discuss with PCP next visit. . Patient self care activities - Over the next 90 days, patient will: o Continue exercise regimen and diet adherence     Diabetes Lab Results  Component Value Date/Time   HGBA1C 7.1 (H) 11/25/2019 10:46 AM   HGBA1C 6.4 (H) 06/13/2019 09:53 AM   . Pharmacist Clinical Goal(s): o Over the next 90 days, patient will work with PharmD and providers to achieve A1c goal <7% . Current regimen:  o Diet and exercise . Interventions: o Discussed A1c and fasting blood glucose levels.  o Discussed adverse effects of elevated glucose levels and recommend patient consider medication at next appointment. [name] Kitchen .Comprehensive medication review performed, medication list updated in electronic medical record . Patient self care activities - Over the next 90 days, patient will: o Check blood sugar once daily, document, and provide at future appointments o Contact provider with any episodes of hypoglycemia  Medication management . Pharmacist Clinical Goal(s): o Over the next 90 days, patient will work with PharmD and providers to achieve optimal medication adherence . Current pharmacy: [facility] . Interventions o Comprehensive medication review performed. o Continue current medication management strategy . Patient self care activities - Over the next  90 days, patient will: o Focus on medication adherence by fill dates o Take medications as prescribed o Report any questions or concerns to PharmD and/or provider(s)  Initial goal documentation       Depression Screen PHQ 2/9 Scores 05/13/2020 03/31/2020 11/25/2019  10/01/2019 09/19/2019 06/13/2019 05/13/2019  PHQ - 2 Score 0 0 0 0 0 0 0  PHQ- 9 Score - 0 0 1 1 1  -    Fall Risk Fall Risk  05/13/2020 03/31/2020 11/25/2019 10/01/2019 09/19/2019  Falls in the past year? 0 0 0 0 0  Number falls in past yr: 0 0 - 0 0  Injury with Fall? 0 0 - 0 0  Risk for fall due to : No Fall Risks - No Fall Risks No Fall Risks No Fall Risks  Follow up Falls prevention discussed Falls evaluation completed Falls evaluation completed Falls evaluation completed Falls evaluation completed    FALL RISK PREVENTION PERTAINING TO THE HOME:  Any stairs in or around the home? No  If so, are there any without handrails? No  Home free of loose throw rugs in walkways, pet beds, electrical cords, etc? Yes  Adequate lighting in your home to reduce risk of falls? Yes   ASSISTIVE DEVICES UTILIZED TO PREVENT FALLS:  Life alert? No  Use of a cane, walker or w/c? No  Grab bars in the bathroom? No  Shower chair or bench in shower? No  Elevated toilet seat or a handicapped toilet? No   TIMED UP AND GO:  Was the test performed? Yes .  Length of time to ambulate 10 feet: 5 sec.   Gait steady and fast without use of assistive device  Cognitive Function:     6CIT Screen 05/13/2020 05/13/2019 04/18/2018 09/26/2016  What Year? 0 points 0 points 0 points 4 points  What month? 0 points 0 points 0 points 0 points  What time? 0 points 0 points 0 points 0 points  Count back from 20 0 points 0 points 0 points 0 points  Months in reverse 0 points 0 points 0 points 0 points  Repeat phrase 0 points 2 points  2 points 2 points  Total Score 0 2 2 6     Immunizations Immunization History  Administered Date(s) Administered  . PFIZER SARS-COV-2 Vaccination 07/04/2019, 07/30/2019, 03/21/2020  . Pneumococcal Conjugate-13 06/24/2016  . Pneumococcal Polysaccharide-23 08/23/2017  . Tdap 06/24/2016    TDAP status: Up to date  Flu vaccine status: not eligible due to allergic to flu vaccine  Pneumococcal  vaccine status: Up to date  Covid-19 vaccine status: Completed vaccines  Qualifies for Shingles Vaccine? Yes   Zostavax completed No   Shingrix Completed?: No.    Education has been provided regarding the importance of this vaccine. Patient has been advised to call insurance company to determine out of pocket expense if they have not yet received this vaccine. Advised may also receive vaccine at local pharmacy or Health Dept. Verbalized acceptance and understanding.  Screening Tests Health Maintenance  Topic Date Due  . FOOT EXAM  06/12/2020  . HEMOGLOBIN A1C  09/28/2020  . OPHTHALMOLOGY EXAM  10/24/2020  . COLONOSCOPY (Pts 45-54yrs Insurance coverage will need to be confirmed)  07/21/2024  . TETANUS/TDAP  06/24/2026  . DEXA SCAN  Completed  . COVID-19 Vaccine  Completed  . Hepatitis C Screening  Completed  . PNA vac Low Risk Adult  Completed  . INFLUENZA VACCINE  Discontinued    Health Maintenance  There are no preventive care reminders to display for this patient.  Colorectal cancer screening: No longer required.   Mammogram status: Completed 04/01/20. Repeat every year  Bone Density status: Completed 06/06/18. Results reflect: Bone density results: OSTEOPENIA. Repeat every 2 years. Ordered today.   Lung Cancer Screening: (Low Dose CT Chest recommended if Age 15-80 years, 30 pack-year currently smoking OR have quit w/in 15years.) does not qualify.   Additional Screening:  Hepatitis C Screening: does qualify; Completed 11/25/19  Vision Screening: Recommended annual ophthalmology exams for early detection of glaucoma and other disorders of the eye. Is the patient up to date with their annual eye exam?  Yes  Who is the provider or what is the name of the office in which the patient attends annual eye exams? [facility]  Dental Screening: Recommended annual dental exams for proper oral hygiene  Community Resource Referral / Chronic Care Management: CRR  required this visit?  No   CCM required this visit?  No      Plan:     I have personally reviewed and noted the following in the patient's chart:   . Medical and social history . Use of alcohol, tobacco or illicit drugs  . Current medications and supplements . Functional ability and status . Nutritional status . Physical activity . Advanced directives . List of other physicians . Hospitalizations, surgeries, and ER visits in previous 12 months . Vitals . Screenings to include cognitive, depression, and falls . Referrals and appointments  In addition, I have reviewed and discussed with patient certain preventive protocols, quality metrics, and best practice recommendations. A written personalized care plan for preventive services as well as general preventive health recommendations were provided to patient.     Brooke Dare, LPN   Re[facility] Littler   Nurse Notes: none

## 2020-05-13 NOTE — Patient Instructions (Signed)
[name] , Thank you for taking time to come for your Medicare Wellness Visit. I appreciate your ongoing commitment to your health goals. Please review the following plan we discussed and let me know if I can assist you in the future.   Screening recommendations/referrals: Colonoscopy: no longer required Mammogram: done 04/01/20 Bone Density: done 06/06/18. Please call [phone] to schedule your bone density screening.   Recommended yearly ophthalmology/optometry visit for glaucoma screening and checkup Recommended yearly dental visit for hygiene and checkup  Vaccinations: Influenza vaccine: n/a Pneumococcal vaccine: done 08/23/17 Tdap vaccine: done 06/24/16 Shingles vaccine: Shingrix discussed. Please contact your pharmacy for coverage information.  Covid-19: done 07/04/19, 07/30/19 & 03/21/20  Conditions/risks identified: Keep up the great work!  Next appointment: Follow up in one year for your annual wellness visit    Preventive Care 65 Years and Older, Female Preventive care refers to lifestyle choices and visits with your health care provider that can promote health and wellness. What does preventive care include?  A yearly physical exam. This is also called an annual well check.  Dental exams once or twice a year.  Routine eye exams. Ask your health care provider how often you should have your eyes checked.  Personal lifestyle choices, including:  Daily care of your teeth and gums.  Regular physical activity.  Eating a healthy diet.  Avoiding tobacco and drug use.  Limiting alcohol use.  Practicing safe sex.  Taking low-dose aspirin every day.  Taking vitamin and mineral supplements as recommended by your health care provider. What happens during an annual well check? The services and screenings done by your health care provider during your annual well check will depend on your age, overall health, lifestyle risk factors, and family history of  disease. Counseling  Your health care provider may ask you questions about your:  Alcohol use.  Tobacco use.  Drug use.  Emotional well-being.  Home and relationship well-being.  Sexual activity.  Eating habits.  History of falls.  Memory and ability to understand (cognition).  Work and work Astronomer.  Reproductive health. Screening  You may have the following tests or measurements:  Height, weight, and BMI.  Blood pressure.  Lipid and cholesterol levels. These may be checked every 5 years, or more frequently if you are over 31 years old.  Skin check.  Lung cancer screening. You may have this screening every year starting at age 80 if you have a 30-pack-year history of smoking and currently smoke or have quit within the past 15 years.  Fecal occult blood test (FOBT) of the stool. You may have this test every year starting at age 31.  Flexible sigmoidoscopy or colonoscopy. You may have a sigmoidoscopy every 5 years or a colonoscopy every 10 years starting at age 24.  Hepatitis C blood test.  Hepatitis B blood test.  Sexually transmitted disease (STD) testing.  Diabetes screening. This is done by checking your blood sugar (glucose) after you have not eaten for a while (fasting). You may have this done every 1-3 years.  Bone density scan. This is done to screen for osteoporosis. You may have this done starting at age 70.  Mammogram. This may be done every 1-2 years. Talk to your health care provider about how often you should have regular mammograms. Talk with your health care provider about your test results, treatment options, and if necessary, the need for more tests. Vaccines  Your health care provider may recommend certain vaccines, such as:  Influenza vaccine.  This is recommended every year.  Tetanus, diphtheria, and acellular pertussis (Tdap, Td) vaccine. You may need a Td booster every 10 years.  Zoster vaccine. You may need this after age  40.  Pneumococcal 13-valent conjugate (PCV13) vaccine. One dose is recommended after age 73.  Pneumococcal polysaccharide (PPSV23) vaccine. One dose is recommended after age 31. Talk to your health care provider about which screenings and vaccines you need and how often you need them. This information is not intended to replace advice given to you by your health care provider. Make sure you discuss any questions you have with your health care provider. Document Released: 05/22/2015 Document Revised: 01/13/2016 Document Reviewed: 02/24/2015 Elsevier Interactive Patient Education  2017 [place] Prevention in the Home Falls can cause injuries. They can happen to people of all ages. There are many things you can do to make your home safe and to help prevent falls. What can I do on the outside of my home?  Regularly fix the edges of walkways and driveways and fix any cracks.  Remove anything that might make you trip as you walk through a door, such as a raised step or threshold.  Trim any bushes or trees on the path to your home.  Use bright outdoor lighting.  Clear any walking paths of anything that might make someone trip, such as rocks or tools.  Regularly check to see if handrails are loose or broken. Make sure that both sides of any steps have handrails.  Any raised decks and porches should have guardrails on the edges.  Have any leaves, snow, or ice cleared regularly.  Use sand or salt on walking paths during winter.  Clean up any spills in your garage right away. This includes oil or grease spills. What can I do in the bathroom?  Use night lights.  Install grab bars by the toilet and in the tub and shower. Do not use towel bars as grab bars.  Use non-skid mats or decals in the tub or shower.  If you need to sit down in the shower, use a plastic, non-slip stool.  Keep the floor dry. Clean up any water that spills on the floor as soon as it happens.  Remove  soap buildup in the tub or shower regularly.  Attach bath mats securely with double-sided non-slip rug tape.  Do not have throw rugs and other things on the floor that can make you trip. What can I do in the bedroom?  Use night lights.  Make sure that you have a light by your bed that is easy to reach.  Do not use any sheets or blankets that are too big for your bed. They should not hang down onto the floor.  Have a firm chair that has side arms. You can use this for support while you get dressed.  Do not have throw rugs and other things on the floor that can make you trip. What can I do in the kitchen?  Clean up any spills right away.  Avoid walking on wet floors.  Keep items that you use a lot in easy-to-reach places.  If you need to reach something above you, use a strong step stool that has a grab bar.  Keep electrical cords out of the way.  Do not use floor polish or wax that makes floors slippery. If you must use wax, use non-skid floor wax.  Do not have throw rugs and other things on the floor that can make  you trip. What can I do with my stairs?  Do not leave any items on the stairs.  Make sure that there are handrails on both sides of the stairs and use them. Fix handrails that are broken or loose. Make sure that handrails are as long as the stairways.  Check any carpeting to make sure that it is firmly attached to the stairs. Fix any carpet that is loose or worn.  Avoid having throw rugs at the top or bottom of the stairs. If you do have throw rugs, attach them to the floor with carpet tape.  Make sure that you have a light switch at the top of the stairs and the bottom of the stairs. If you do not have them, ask someone to add them for you. What else can I do to help prevent falls?  Wear shoes that:  Do not have high heels.  Have rubber bottoms.  Are comfortable and fit you well.  Are closed at the toe. Do not wear sandals.  If you use a  stepladder:  Make sure that it is fully opened. Do not climb a closed stepladder.  Make sure that both sides of the stepladder are locked into place.  Ask someone to hold it for you, if possible.  Clearly mark and make sure that you can see:  Any grab bars or handrails.  First and last steps.  Where the edge of each step is.  Use tools that help you move around (mobility aids) if they are needed. These include:  Canes.  Walkers.  Scooters.  Crutches.  Turn on the lights when you go into a dark area. Replace any light bulbs as soon as they burn out.  Set up your furniture so you have a clear path. Avoid moving your furniture around.  If any of your floors are uneven, fix them.  If there are any pets around you, be aware of where they are.  Review your medicines with your doctor. Some medicines can make you feel dizzy. This can increase your chance of falling. Ask your doctor what other things that you can do to help prevent falls. This information is not intended to replace advice given to you by your health care provider. Make sure you discuss any questions you have with your health care provider. Document Released: 02/19/2009 Document Revised: 10/01/2015 Document Reviewed: 05/30/2014 Elsevier Interactive Patient Education  2017 [employer].

## 2020-05-15 IMAGING — MG DIGITAL SCREENING BILATERAL MAMMOGRAM WITH TOMO AND CAD
8 series · 8 of 24 positions shown · non-contrast
Comparison: Previous exam(s).

CLINICAL DATA: Screening.

EXAM:
DIGITAL SCREENING BILATERAL MAMMOGRAM WITH TOMO AND CAD

[R CC synth-2D]
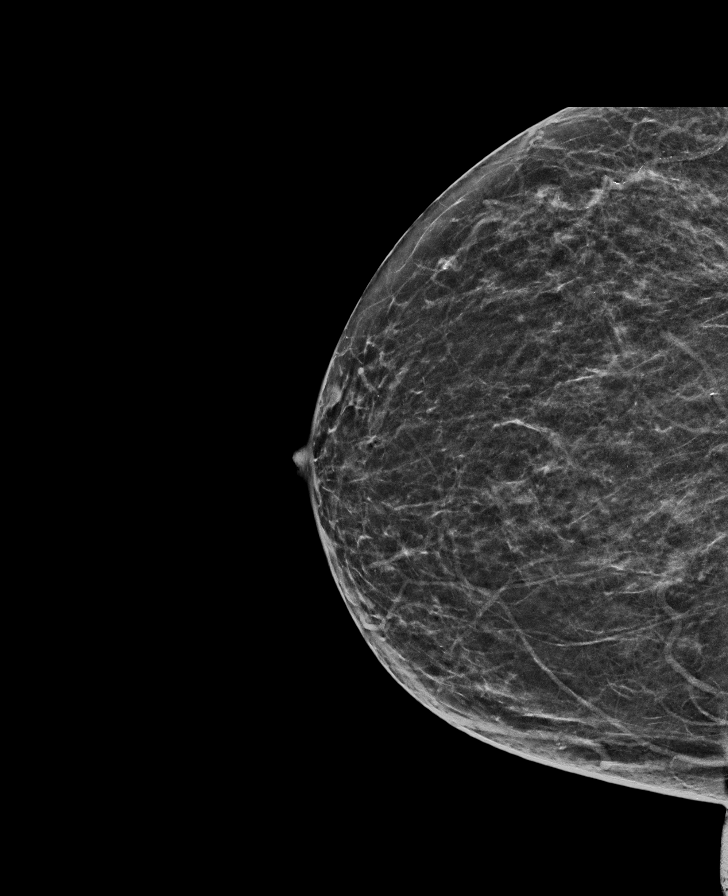

[L MLO synth-2D]
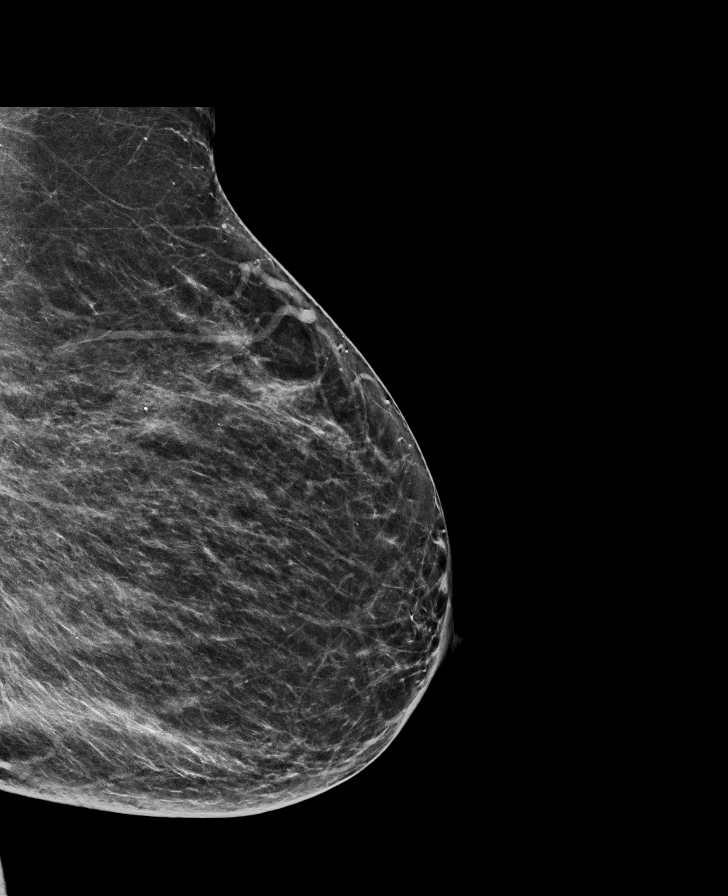

[L CC synth-2D]
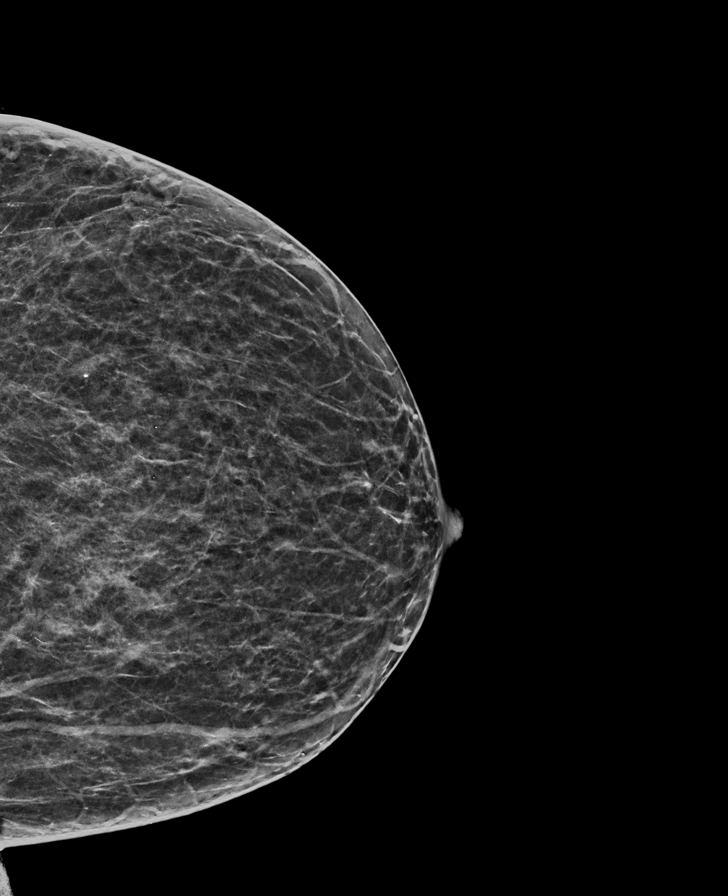

[R MLO synth-2D]
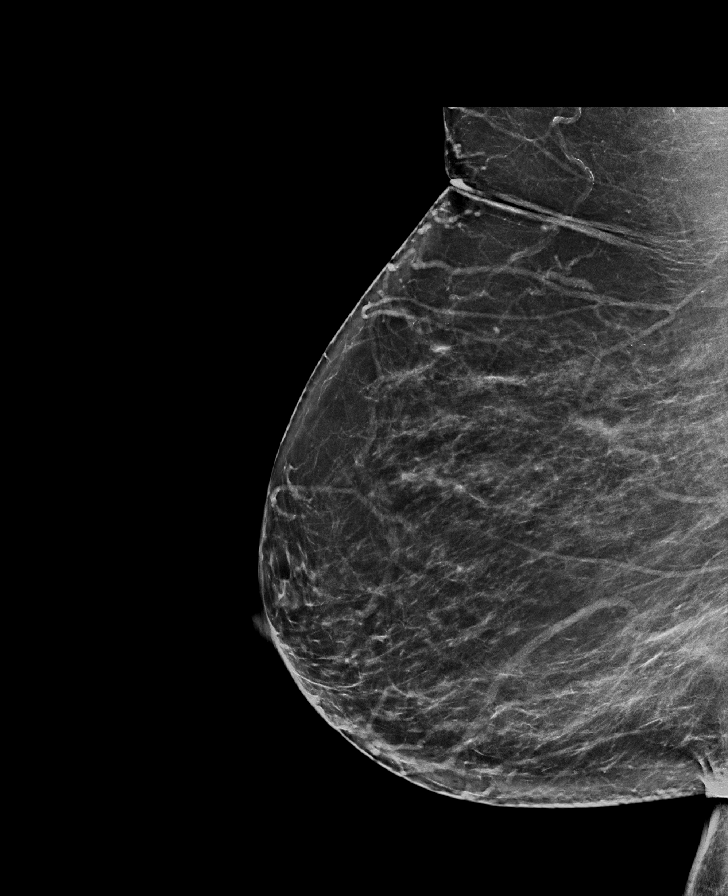

[L CC tomo · tomo slice 30/59.0]
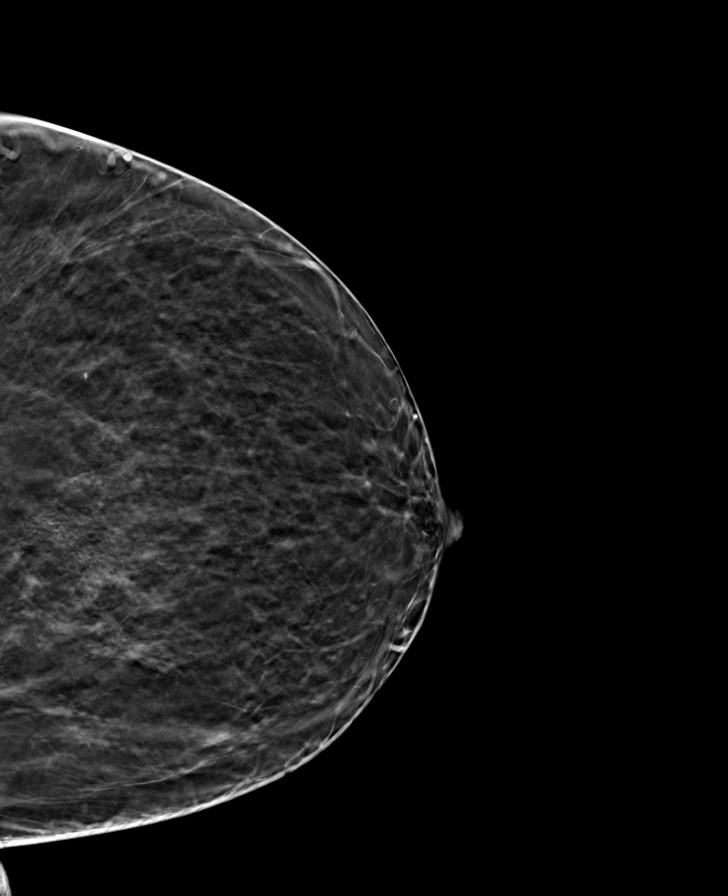

[R MLO tomo · tomo slice 37/73.0]
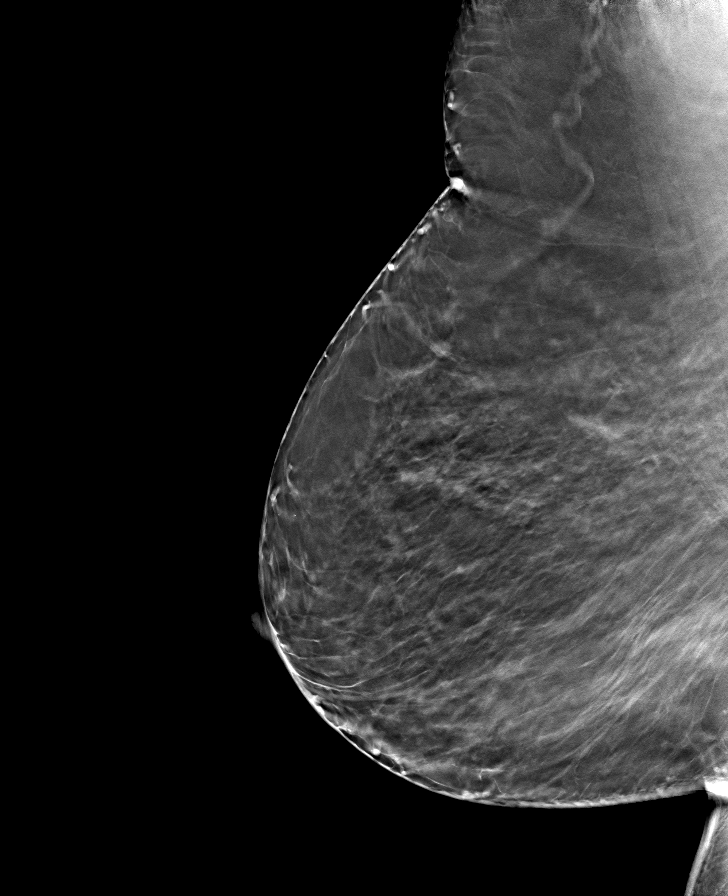

[R CC tomo · tomo slice 29/58.0]
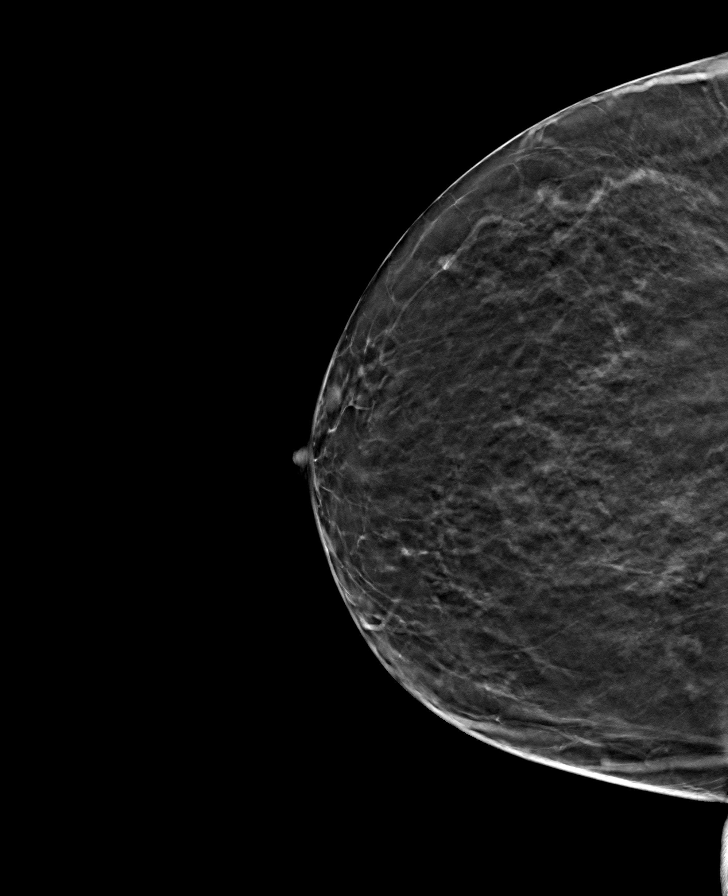

[L MLO tomo · tomo slice 34/67.0]
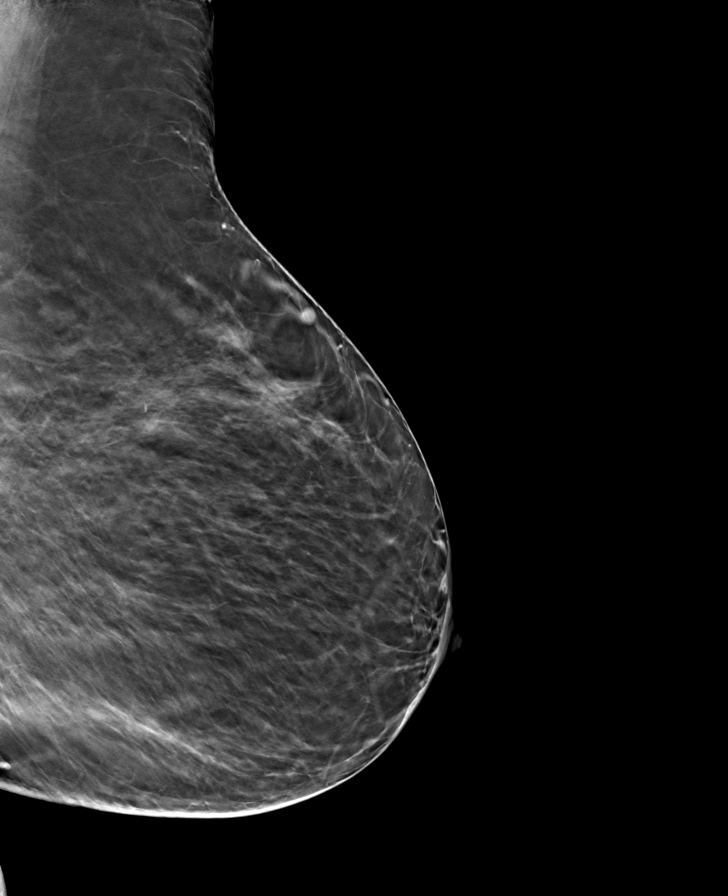

[8 of 24 positions shown; findings below may reference images not displayed]

ACR Breast Density Category c: The breast tissue is heterogeneously
dense, which may obscure small masses.
FINDINGS: There are no findings suspicious for malignancy. Images were
processed with CAD.
IMPRESSION: No mammographic evidence of malignancy. A result letter of this
screening mammogram will be mailed directly to the patient.

RECOMMENDATION:
Screening mammogram in one year. (Code:[id number])

BI-RADS CATEGORY  1: Negative.

## 2020-05-20 ENCOUNTER — Other Ambulatory Visit: Payer: Self-pay | Admitting: Internal Medicine

## 2020-05-20 DIAGNOSIS — I10 Essential (primary) hypertension: Secondary | ICD-10-CM

## 2020-05-25 DIAGNOSIS — G4733 Obstructive sleep apnea (adult) (pediatric): Secondary | ICD-10-CM | POA: Diagnosis not present

## 2020-05-26 ENCOUNTER — Other Ambulatory Visit: Payer: Self-pay | Admitting: Internal Medicine

## 2020-05-26 MED ORDER — ONETOUCH ULTRA VI STRP: ORAL_STRIP | 12 refills | Status: DC

## 2020-05-26 NOTE — Telephone Encounter (Signed)
Copied from [id number]. Topic: Quick Communication - Rx Refill/Question >> May 26, 2020 11:35 AM Leward Quan A wrote: Medication: glucose blood (ONETOUCH ULTRA) test strip  Has the patient contacted their pharmacy? Yes.   (Agent: If no, request that the patient contact the pharmacy for the refill.) (Agent: If yes, when and what did the pharmacy advise?)  Preferred Pharmacy (with phone number or street name): [place], [place] - [place]  Phone:  [phone] Fax:  [phone]     Agent: Please be advised that RX refills may take up to 3 business days. We ask that you follow-up with your pharmacy.

## 2020-06-08 ENCOUNTER — Other Ambulatory Visit: Payer: Self-pay

## 2020-06-08 ENCOUNTER — Telehealth: Payer: Self-pay | Admitting: Internal Medicine

## 2020-06-08 ENCOUNTER — Ambulatory Visit
Admission: RE | Admit: 2020-06-08 | Discharge: 2020-06-08 | Disposition: A | Discharge status: Home / Self Care | Payer: Medicare HMO | Source: Ambulatory Visit | Attending: Internal Medicine | Admitting: Internal Medicine

## 2020-06-08 DIAGNOSIS — Z780 Asymptomatic menopausal state: Secondary | ICD-10-CM | POA: Diagnosis not present

## 2020-06-08 DIAGNOSIS — M85831 Other specified disorders of bone density and structure, right forearm: Secondary | ICD-10-CM | POA: Diagnosis not present

## 2020-06-08 NOTE — Telephone Encounter (Signed)
Noted  KP 

## 2020-06-23 DIAGNOSIS — I10 Essential (primary) hypertension: Secondary | ICD-10-CM | POA: Diagnosis not present

## 2020-06-23 DIAGNOSIS — E669 Obesity, unspecified: Secondary | ICD-10-CM | POA: Diagnosis not present

## 2020-06-23 DIAGNOSIS — H04129 Dry eye syndrome of unspecified lacrimal gland: Secondary | ICD-10-CM | POA: Diagnosis not present

## 2020-06-23 DIAGNOSIS — R69 Illness, unspecified: Secondary | ICD-10-CM | POA: Diagnosis not present

## 2020-06-23 DIAGNOSIS — E1165 Type 2 diabetes mellitus with hyperglycemia: Secondary | ICD-10-CM | POA: Diagnosis not present

## 2020-06-23 DIAGNOSIS — M1990 Unspecified osteoarthritis, unspecified site: Secondary | ICD-10-CM | POA: Diagnosis not present

## 2020-06-23 DIAGNOSIS — K5900 Constipation, unspecified: Secondary | ICD-10-CM | POA: Diagnosis not present

## 2020-06-23 DIAGNOSIS — E785 Hyperlipidemia, unspecified: Secondary | ICD-10-CM | POA: Diagnosis not present

## 2020-06-23 DIAGNOSIS — G4733 Obstructive sleep apnea (adult) (pediatric): Secondary | ICD-10-CM | POA: Diagnosis not present

## 2020-06-23 DIAGNOSIS — F411 Generalized anxiety disorder: Secondary | ICD-10-CM | POA: Diagnosis not present

## 2020-06-25 DIAGNOSIS — G4733 Obstructive sleep apnea (adult) (pediatric): Secondary | ICD-10-CM | POA: Diagnosis not present

## 2020-07-01 ENCOUNTER — Ambulatory Visit (INDEPENDENT_AMBULATORY_CARE_PROVIDER_SITE_OTHER): Payer: Medicare HMO | Admitting: Internal Medicine

## 2020-07-01 ENCOUNTER — Other Ambulatory Visit: Payer: Self-pay

## 2020-07-01 ENCOUNTER — Encounter: Payer: Self-pay | Admitting: Internal Medicine

## 2020-07-01 VITALS — BP 130/60 | HR 78 | Temp 98.2°F | Ht 67.0 in | Wt 208.0 lb

## 2020-07-01 DIAGNOSIS — M1612 Unilateral primary osteoarthritis, left hip: Secondary | ICD-10-CM

## 2020-07-01 MED ORDER — MELOXICAM 15 MG PO TABS: 15.0000 mg | ORAL_TABLET | Freq: Every day | ORAL | 0 refills | Status: DC

## 2020-07-01 NOTE — Progress Notes (Signed)
Date:  07/01/2020   Name:  Molly Ruiz   DOB:  [birth year]   MRN:  [id number]   Chief Complaint: Back Pain (X1.5 weeks,left side,  Lower back pain, hip and leg pain , pains shoots from hip to leg causing back to be irritated, back doesn't hurt all the time but hip and leg hurts often, hurts when walking sometimes, hurts more when going from sitting to standing position )  Back Pain This is a recurrent problem. The problem has been waxing and waning since onset. The pain is present in the lumbar spine. Pertinent negatives include no chest pain, fever or headaches. Treatments tried: s/p lumbar surgery > 20 yr ago.  Hip Pain  There was no injury mechanism (started after adding treadmill to her exercise on the bike). The pain is present in the left leg and left hip. The quality of the pain is described as aching and cramping. The pain is moderate. The pain has been fluctuating since onset. The symptoms are aggravated by weight bearing and movement. She has tried acetaminophen for the symptoms. The treatment provided mild relief.    Lab Results  Component Value Date   CREATININE 0.99 11/25/2019   BUN 12 11/25/2019   NA 140 11/25/2019   K 4.6 11/25/2019   CL 100 11/25/2019   CO2 26 11/25/2019   Lab Results  Component Value Date   CHOL 271 (H) 03/31/2020   HDL 61 03/31/2020   LDLCALC 164 (H) 03/31/2020   TRIG 247 (H) 03/31/2020   CHOLHDL 4.4 03/31/2020   Lab Results  Component Value Date   TSH 1.730 11/25/2019   Lab Results  Component Value Date   HGBA1C 7.0 (H) 03/31/2020   Lab Results  Component Value Date   WBC 5.4 06/13/2019   HGB 12.3 06/13/2019   HCT 36.6 06/13/2019   MCV 88 06/13/2019   PLT 201 06/13/2019   Lab Results  Component Value Date   ALT 15 11/25/2019   AST 24 11/25/2019   ALKPHOS 108 11/25/2019   BILITOT 0.3 11/25/2019     Review of Systems  Constitutional: Negative for chills, fatigue and fever.  Respiratory: Negative for cough, chest  tightness and shortness of breath.   Cardiovascular: Negative for chest pain and leg swelling.  Musculoskeletal: Positive for arthralgias, back pain and myalgias. Negative for gait problem.  Neurological: Negative for dizziness, light-headedness and headaches.    Patient Active Problem List   Diagnosis Date Noted  . Myalgia due to statin 03/31/2020  . Gastroesophageal reflux disease 09/19/2019  . Special screening for malignant neoplasms, colon   . Tubular adenoma of colon   . Anxiety disorder 06/13/2019  . Osteopenia determined by x-ray 06/06/2018  . Essential hypertension 04/26/2018  . Eczema 10/25/2017  . 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 Osteoarthritis of left hip 01/12/2017  . OSA on CPAP 01/02/2017  . Type II diabetes mellitus with complication ([place]) 17/51/0258  . 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 Hyperlipidemia associated with type 2 diabetes mellitus ([place]) 06/24/2016  . Obesity (BMI 30.0-34.9) 06/24/2016  . Vitamin D deficiency 06/24/2016  . History of shingles 06/24/2016  . FH: heart disease 06/24/2016  . Allergic rhinitis 06/24/2016    Allergies  Allergen Reactions  . Atorvastatin Other (See Comments)    Peripheral neuropathy and myalgia  . Influenza Vaccines Rash    Past Surgical History:  Procedure Laterality Date  . ABDOMINAL HYSTERECTOMY    . BILATERAL CARPAL  TUNNEL RELEASE    . CATARACT EXTRACTION W/PHACO Right 11/12/2018   Procedure: CATARACT EXTRACTION PHACO AND INTRAOCULAR LENS PLACEMENT ([place])  RIGHT DIABETIC;  Surgeon: Eulogio Bear, MD;  Location: [facility];  Service: Ophthalmology;  Laterality: Right;  Diabetic - diet controlled sleep apnea  . CERVICAL FUSION  2004  . COLONOSCOPY WITH PROPOFOL N/A 07/22/2019   Procedure: COLONOSCOPY WITH BIOPSIES;  Surgeon: Lucilla Lame, MD;  Location: [facility];  Service: Endoscopy;  Laterality: N/A;  Diabetic - diet controlled priority 4  . FOOT SURGERY     . [place] SURGERY  1998  . POLYPECTOMY N/A 07/22/2019   Procedure: POLYPECTOMY;  Surgeon: Lucilla Lame, MD;  Location: [facility];  Service: Endoscopy;  Laterality: N/A;    Social History   Tobacco Use  . Smoking status: Never Smoker  . Smokeless tobacco: Never Used  Vaping Use  . Vaping Use: Never used  Substance Use Topics  . Alcohol use: No  . Drug use: No     Medication list has been reviewed and updated.  Current Meds  Medication Sig  . acetaminophen (TYLENOL) 500 MG tablet Take 2 tablets (1,000 mg total) by mouth 2 (two) times daily.  [name] Kitchen amLODipine (NORVASC) 5 MG tablet Take 1 tablet by mouth once daily  . buPROPion (WELLBUTRIN XL) 150 MG 24 hr tablet Take 1 tablet by mouth once daily  . CALCIUM CITRATE PO Take 600 mg by mouth daily.  . cholecalciferol (VITAMIN D3) 25 MCG (1000 UNIT) tablet Take 1,000 Units by mouth daily.  [name] Kitchen ezetimibe (ZETIA) 10 MG tablet Take 1 tablet (10 mg total) by mouth daily.  [name] Kitchen glucose blood (ONETOUCH ULTRA) test strip Test Blood Sugar twice daily.  . irbesartan (AVAPRO) 300 MG tablet Take 1 tablet by mouth once daily  . Lancets 28G MISC 1 each by Does not apply route daily.  . Multiple Vitamins-Minerals (MULTIVITAMIN ADULTS 50+ PO) Take by mouth.  . NON FORMULARY CPAP @@ 12 cm H20  . Omega-3 Fatty Acids (FISH OIL) 1000 MG CAPS Take 1 capsule by mouth daily.  . polyethylene glycol powder (GLYCOLAX/MIRALAX) 17 GM/SCOOP powder Take 17 g by mouth 2 (two) times daily as needed for moderate constipation.  [name] Kitchen REFRESH 1.4-0.6 % SOLN Apply to eye as needed.    PHQ 2/9 Scores 07/01/2020 05/13/2020 03/31/2020 11/25/2019  PHQ - 2 Score 0 0 0 0  PHQ- 9 Score 0 - 0 0    GAD 7 : Generalized Anxiety Score 07/01/2020 03/31/2020 11/25/2019 10/01/2019  Nervous, Anxious, on Edge 0 0 1 0  Control/stop worrying 0 0 0 0  Worry too much - different things 0 0 0 0  Trouble relaxing 0 0 0 0  Restless 0 0 0 0  Easily annoyed or irritable 0 0 0 0  Afraid -  awful might happen 0 0 0 0  Total GAD 7 Score 0 0 1 0  Anxiety Difficulty - Not difficult at all Not difficult at all Not difficult at all    BP Readings from Last 3 Encounters:  07/01/20 130/60  05/13/20 122/72  03/31/20 130/66    Physical Exam Vitals and nursing note reviewed.  Constitutional:      General: She is not in acute distress.    Appearance: She is well-developed.  HENT:     Head: Normocephalic and atraumatic.  Cardiovascular:     Rate and Rhythm: Normal rate and regular rhythm.  Pulmonary:     Effort: Pulmonary  effort is normal. No respiratory distress.     Breath sounds: No wheezing or rhonchi.  Musculoskeletal:     Lumbar back: Positive right straight leg raise test and positive left straight leg raise test.     Left hip: Bony tenderness present. No crepitus. Decreased range of motion.  Skin:    General: Skin is warm and dry.     Findings: No rash.  Neurological:     Mental Status: She is alert and oriented to person, place, and time.     Motor: No weakness.     Deep Tendon Reflexes:     Reflex Scores:      Patellar reflexes are 1+ on the right side and 1+ on the left side.      Achilles reflexes are 1+ on the right side and 1+ on the left side. Psychiatric:        Mood and Affect: Mood normal.        Behavior: Behavior normal.     Wt Readings from Last 3 Encounters:  07/01/20 208 lb (94.3 kg)  05/13/20 208 lb (94.3 kg)  03/31/20 207 lb (93.9 kg)    BP 130/60   Pulse 78   Temp 98.2 F (36.8 C) (Oral)   Ht 5\' 7"  (1.702 m)   Wt 208 lb (94.3 kg)   SpO2 96%   BMI 32.58 kg/m   Assessment and Plan: 1. Primary osteoarthritis of left hip Patient reports having hip xray showing significant OA several years ago. Will try daily nsaid and limited activity.  Also recommend heat or ice plus topical rubs. Follow up in 2 weeks to discuss progress and possible orthopedic referral. - meloxicam (MOBIC) 15 MG tablet; Take 1 tablet (15 mg total) by mouth  daily.  Dispense: 30 tablet; Refill: 0   Partially dictated using Editor, commissioning. Any errors are unintentional.  [name], MD [facility]  07/01/2020

## 2020-07-08 DIAGNOSIS — L298 Other pruritus: Secondary | ICD-10-CM | POA: Diagnosis not present

## 2020-07-13 ENCOUNTER — Encounter: Payer: Self-pay | Admitting: Internal Medicine

## 2020-07-13 ENCOUNTER — Ambulatory Visit (INDEPENDENT_AMBULATORY_CARE_PROVIDER_SITE_OTHER): Payer: Medicare HMO | Admitting: Internal Medicine

## 2020-07-13 ENCOUNTER — Other Ambulatory Visit: Payer: Self-pay

## 2020-07-13 VITALS — BP 140/66 | HR 69 | Temp 97.7°F | Ht 67.0 in | Wt 209.0 lb

## 2020-07-13 DIAGNOSIS — I10 Essential (primary) hypertension: Secondary | ICD-10-CM | POA: Diagnosis not present

## 2020-07-13 DIAGNOSIS — E1169 Type 2 diabetes mellitus with other specified complication: Secondary | ICD-10-CM | POA: Diagnosis not present

## 2020-07-13 DIAGNOSIS — E785 Hyperlipidemia, unspecified: Secondary | ICD-10-CM

## 2020-07-13 DIAGNOSIS — E118 Type 2 diabetes mellitus with unspecified complications: Secondary | ICD-10-CM

## 2020-07-13 DIAGNOSIS — M25551 Pain in right hip: Secondary | ICD-10-CM | POA: Diagnosis not present

## 2020-07-13 LAB — POCT GLYCOSYLATED HEMOGLOBIN (HGB A1C): Hemoglobin A1C: 6.4 % — AB (ref 4.0–5.6)

## 2020-07-13 NOTE — Progress Notes (Signed)
Date:  07/13/2020   Name:  Molly Ruiz   DOB:  1941-05-08   MRN:  [id number]   Chief Complaint: Hypertension and Diabetes (POCT A1C)  Hypertension This is a chronic problem. The problem is controlled. Pertinent negatives include no chest pain, headaches, palpitations or shortness of breath. Past treatments include angiotensin blockers and calcium channel blockers. The current treatment provides significant improvement. There are no compliance problems.   Diabetes She presents for her follow-up diabetic visit. She has type 2 diabetes mellitus. Her disease course has been stable. Pertinent negatives for hypoglycemia include no dizziness or headaches. Pertinent negatives for diabetes include no chest pain, no fatigue and no weakness. Current diabetic treatment includes diet. She is compliant with treatment most of the time.  Hip Pain  There was no injury mechanism. The pain is present in the right hip and right thigh. The pain is moderate. The pain has been improving since onset. She has tried NSAIDs, ice and rest for the symptoms. The treatment provided significant (but now starting to hurt again) relief.    Lab Results  Component Value Date   CREATININE 0.99 11/25/2019   BUN 12 11/25/2019   NA 140 11/25/2019   K 4.6 11/25/2019   CL 100 11/25/2019   CO2 26 11/25/2019   Lab Results  Component Value Date   CHOL 271 (H) 03/31/2020   HDL 61 03/31/2020   LDLCALC 164 (H) 03/31/2020   TRIG 247 (H) 03/31/2020   CHOLHDL 4.4 03/31/2020   Lab Results  Component Value Date   TSH 1.730 11/25/2019   Lab Results  Component Value Date   HGBA1C 7.0 (H) 03/31/2020   Lab Results  Component Value Date   WBC 5.4 06/13/2019   HGB 12.3 06/13/2019   HCT 36.6 06/13/2019   MCV 88 06/13/2019   PLT 201 06/13/2019   Lab Results  Component Value Date   ALT 15 11/25/2019   AST 24 11/25/2019   ALKPHOS 108 11/25/2019   BILITOT 0.3 11/25/2019     Review of Systems  Constitutional:  Negative for fatigue and unexpected weight change.  HENT: Negative for nosebleeds.   Eyes: Negative for visual disturbance.  Respiratory: Negative for cough, chest tightness, shortness of breath and wheezing.   Cardiovascular: Positive for leg swelling (mild ankle edema intermittently). Negative for chest pain and palpitations.  Gastrointestinal: Negative for abdominal pain, constipation and diarrhea.  Musculoskeletal: Positive for arthralgias and gait problem.  Neurological: Negative for dizziness, weakness, light-headedness and headaches.    Patient Active Problem List   Diagnosis Date Noted  . Myalgia due to statin 03/31/2020  . Gastroesophageal reflux disease 09/19/2019  . Special screening for malignant neoplasms, colon   . Tubular adenoma of colon   . Anxiety disorder 06/13/2019  . Osteopenia determined by x-ray 06/06/2018  . Essential hypertension 04/26/2018  . Eczema 10/25/2017  . 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 Osteoarthritis of left hip 01/12/2017  . OSA on CPAP 01/02/2017  . Type II diabetes mellitus with complication ([place]) 32/95/1884  . 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 Hyperlipidemia associated with type 2 diabetes mellitus ([place]) 06/24/2016  . Obesity (BMI 30.0-34.9) 06/24/2016  . Vitamin D deficiency 06/24/2016  . History of shingles 06/24/2016  . FH: heart disease 06/24/2016  . Allergic rhinitis 06/24/2016    Allergies  Allergen Reactions  . Atorvastatin Other (See Comments)    Peripheral neuropathy and myalgia  . Influenza Vaccines Rash  Past Surgical History:  Procedure Laterality Date  . ABDOMINAL HYSTERECTOMY    . BILATERAL CARPAL TUNNEL RELEASE    . CATARACT EXTRACTION W/PHACO Right 11/12/2018   Procedure: CATARACT EXTRACTION PHACO AND INTRAOCULAR LENS PLACEMENT ([place])  RIGHT DIABETIC;  Surgeon: Eulogio Bear, MD;  Location: [facility];  Service: Ophthalmology;  Laterality: Right;  Diabetic - diet  controlled sleep apnea  . CERVICAL FUSION  2004  . COLONOSCOPY WITH PROPOFOL N/A 07/22/2019   Procedure: COLONOSCOPY WITH BIOPSIES;  Surgeon: Lucilla Lame, MD;  Location: [facility];  Service: Endoscopy;  Laterality: N/A;  Diabetic - diet controlled priority 4  . FOOT SURGERY    . [place] SURGERY  1998  . POLYPECTOMY N/A 07/22/2019   Procedure: POLYPECTOMY;  Surgeon: Lucilla Lame, MD;  Location: [place] [facility];  Service: Endoscopy;  Laterality: N/A;    Social History   Tobacco Use  . Smoking status: Never Smoker  . Smokeless tobacco: Never Used  Vaping Use  . Vaping Use: Never used  Substance Use Topics  . Alcohol use: No  . Drug use: No     Medication list has been reviewed and updated.  Current Meds  Medication Sig  . acetaminophen (TYLENOL) 500 MG tablet Take 2 tablets (1,000 mg total) by mouth 2 (two) times daily.  [name] Kitchen amLODipine (NORVASC) 5 MG tablet Take 1 tablet by mouth once daily  . buPROPion (WELLBUTRIN XL) 150 MG 24 hr tablet Take 1 tablet by mouth once daily  . CALCIUM CITRATE PO Take 600 mg by mouth daily.  . cholecalciferol (VITAMIN D3) 25 MCG (1000 UNIT) tablet Take 1,000 Units by mouth daily.  [name] Kitchen ezetimibe (ZETIA) 10 MG tablet Take 1 tablet (10 mg total) by mouth daily.  [name] Kitchen glucose blood (ONETOUCH ULTRA) test strip Test Blood Sugar twice daily.  . irbesartan (AVAPRO) 300 MG tablet Take 1 tablet by mouth once daily  . Lancets 28G MISC 1 each by Does not apply route daily.  . meloxicam (MOBIC) 15 MG tablet Take 1 tablet (15 mg total) by mouth daily.  . Multiple Vitamins-Minerals (MULTIVITAMIN ADULTS 50+ PO) Take by mouth.  . NON FORMULARY CPAP @@ 12 cm H20  . Omega-3 Fatty Acids (FISH OIL) 1000 MG CAPS Take 1 capsule by mouth daily.  . polyethylene glycol powder (GLYCOLAX/MIRALAX) 17 GM/SCOOP powder Take 17 g by mouth 2 (two) times daily as needed for moderate constipation.  [name] Kitchen REFRESH 1.4-0.6 % SOLN Apply to eye as needed.    PHQ 2/9 Scores  07/01/2020 05/13/2020 03/31/2020 11/25/2019  PHQ - 2 Score 0 0 0 0  PHQ- 9 Score 0 - 0 0    GAD 7 : Generalized Anxiety Score 07/01/2020 03/31/2020 11/25/2019 10/01/2019  Nervous, Anxious, on Edge 0 0 1 0  Control/stop worrying 0 0 0 0  Worry too much - different things 0 0 0 0  Trouble relaxing 0 0 0 0  Restless 0 0 0 0  Easily annoyed or irritable 0 0 0 0  Afraid - awful might happen 0 0 0 0  Total GAD 7 Score 0 0 1 0  Anxiety Difficulty - Not difficult at all Not difficult at all Not difficult at all    BP Readings from Last 3 Encounters:  07/13/20 140/66  07/01/20 130/60  05/13/20 122/72    Physical Exam Vitals and nursing note reviewed.  Constitutional:      General: She is not in acute distress.    Appearance: She is  well-developed.  HENT:     Head: Normocephalic and atraumatic.  Cardiovascular:     Rate and Rhythm: Normal rate and regular rhythm.     Pulses: Normal pulses.  Pulmonary:     Effort: Pulmonary effort is normal. No respiratory distress.     Breath sounds: No wheezing or rhonchi.  Musculoskeletal:     Right lower leg: No edema.     Left lower leg: Edema (trace) present.  Skin:    General: Skin is warm and dry.     Capillary Refill: Capillary refill takes less than 2 seconds.     Findings: No rash.  Neurological:     General: No focal deficit present.     Mental Status: She is alert and oriented to person, place, and time.     Gait: Gait normal.  Psychiatric:        Mood and Affect: Mood normal.        Behavior: Behavior normal.     Wt Readings from Last 3 Encounters:  07/13/20 209 lb (94.8 kg)  07/01/20 208 lb (94.3 kg)  05/13/20 208 lb (94.3 kg)    BP 140/66   Pulse 69   Temp 97.7 F (36.5 C) (Oral)   Ht 5\' 7"  (1.702 m)   Wt 209 lb (94.8 kg)   SpO2 98%   BMI 32.73 kg/m   Assessment and Plan: 1. Type II diabetes mellitus with complication (HCC) Clinically stable by exam and report without s/s of hypoglycemia. DM complicated by  HTN. Doing well with diet modifications - POCT glycosylated hemoglobin (Hb A1C) = 6.4  2. Essential hypertension Clinically stable exam with well controlled BP. Tolerating medications without side effects at this time. Mild edema may be due to amlodipine vs recent inactivity Pt to continue current regimen and low sodium diet; benefits of regular exercise as able discussed.  3. Hyperlipidemia associated with type 2 diabetes mellitus ([place]) Now on zetia with no side effects - unable to tolerate atorvastatin Will obtain labs next visit  4. Hip pain, right Continue Mobic Gradually resume exercise Will schedule with Dr. Zigmund Daniel asap   Partially dictated using [place]. Any errors are unintentional.  [name], MD [facility]  07/13/2020

## 2020-07-23 ENCOUNTER — Ambulatory Visit
Admission: RE | Admit: 2020-07-23 | Discharge: 2020-07-23 | Disposition: A | Discharge status: Home / Self Care | Payer: Medicare HMO | Source: Ambulatory Visit | Attending: Family Medicine | Admitting: Family Medicine

## 2020-07-23 ENCOUNTER — Other Ambulatory Visit: Payer: Self-pay

## 2020-07-23 ENCOUNTER — Ambulatory Visit (INDEPENDENT_AMBULATORY_CARE_PROVIDER_SITE_OTHER): Payer: Medicare HMO | Admitting: Family Medicine

## 2020-07-23 ENCOUNTER — Encounter: Payer: Self-pay | Admitting: Family Medicine

## 2020-07-23 ENCOUNTER — Ambulatory Visit
Admission: RE | Admit: 2020-07-23 | Discharge: 2020-07-23 | Disposition: A | Discharge status: Home / Self Care | Payer: Medicare HMO | Attending: Family Medicine | Admitting: Family Medicine

## 2020-07-23 VITALS — BP 124/68 | HR 69 | Temp 97.9°F | Ht 67.0 in | Wt 212.0 lb

## 2020-07-23 DIAGNOSIS — M1612 Unilateral primary osteoarthritis, left hip: Secondary | ICD-10-CM

## 2020-07-23 DIAGNOSIS — R1032 Left lower quadrant pain: Secondary | ICD-10-CM | POA: Diagnosis not present

## 2020-07-23 NOTE — Assessment & Plan Note (Signed)
Clinical history and radiographic findings are consistent with symptoms focal to the left hip joint in the setting of severe lumbosacral degenerative disease.  That being said, her primary symptoms are related to the hip joint and this will be further evaluated with dedicated plain films, her stated radiating symptoms are most likely due to secondary/compensatory involvement of the lumbosacral spine, anticipate this to improve as her hip pain resolves.  I have performed Rx management with the discussion of her previously prescribed meloxicam, she is to continue this on an as needed basis, plan for return in 1 week to review x-rays and if symptoms persist, can consider ultrasound-guided intra-articular left hip injection.  Patient is understanding and amenable to this plan moving forward.

## 2020-07-23 NOTE — Patient Instructions (Signed)
-   Dose meloxicam once daily on an as-needed basis for hip pain (take with food) - Obtain new hip x-ray with order provided - Return in 1 week to review x-ray and discuss next steps - Call for questions

## 2020-07-23 NOTE — Progress Notes (Signed)
Acute Office Visit  Subjective:    Patient ID: Molly Ruiz, female    DOB: [birth year], 80 y.o.   MRN: [id number]  Chief Complaint  Patient presents with  . New Patient (Initial Visit)  . Hip Pain    Left; intermittent; radiates down left leg; x2 months; no known injury; prior X-rays showed arthritis per patient; relieved with meloxicam; worse at night    HPI Patient is in today for evaluation of atraumatic left hip pain, anterolateral with intermittent radiation along her left lateral leg below the knee to the foot, denies overt numbness.  The radiating pain is primarily noted when she is supine at night on her bed, anterolateral hip pain with ambulation, activities.  Symptoms have been present for roughly 1 month, at onset he did describe a new home exercise program involving walking and cycling but denies any overt trauma.  She has trialed meloxicam with benefit, utilizes this on an as needed basis.  Past Medical History:  Diagnosis Date  . Complication of anesthesia    Hair falls out.  . Depression   . Diabetes mellitus without complication ([place])   . Hyperlipidemia   . Hypertension   . Sleep apnea    CPAP  . Wears dentures    partial upper    Past Surgical History:  Procedure Laterality Date  . ABDOMINAL HYSTERECTOMY    . BILATERAL CARPAL TUNNEL RELEASE    . CATARACT EXTRACTION W/PHACO Right 11/12/2018   Procedure: CATARACT EXTRACTION PHACO AND INTRAOCULAR LENS PLACEMENT ([place])  RIGHT DIABETIC;  Surgeon: Eulogio Bear, MD;  Location: [facility];  Service: Ophthalmology;  Laterality: Right;  Diabetic - diet controlled sleep apnea  . CERVICAL FUSION  2004  . COLONOSCOPY WITH PROPOFOL N/A 07/22/2019   Procedure: COLONOSCOPY WITH BIOPSIES;  Surgeon: Lucilla Lame, MD;  Location: [facility];  Service: Endoscopy;  Laterality: N/A;  Diabetic - diet controlled priority 4  . FOOT SURGERY    . [place] SURGERY  1998  . POLYPECTOMY N/A 07/22/2019    Procedure: POLYPECTOMY;  Surgeon: Lucilla Lame, MD;  Location: [facility];  Service: Endoscopy;  Laterality: N/A;    Family History  Problem Relation Age of Onset  . Diabetes Mother   . Stroke Mother   . Heart disease Sister   . Stroke Brother   . Prostate cancer Brother   . Breast cancer Other 26    Social History   Socioeconomic History  . Marital status: Widowed    Spouse name: Not on file  . Number of children: 3  . Years of education: 74  . Highest education level: High school graduate  Occupational History  . Occupation: Retired  Tobacco Use  . Smoking status: Never Smoker  . Smokeless tobacco: Never Used  Vaping Use  . Vaping Use: Never used  Substance and Sexual Activity  . Alcohol use: No  . Drug use: No  . Sexual activity: Not Currently  Other Topics Concern  . Not on file  Social History Narrative   Pt lives with her daughter   Social Determinants of Health   Financial Resource Strain: Low Risk   . Difficulty of Paying Living Expenses: Not very hard  Food Insecurity: No Food Insecurity  . Worried About Charity fundraiser in the Last Year: Never true  . Ran Out of Food in the Last Year: Never true  Transportation Needs: No Transportation Needs  . Lack of Transportation (Medical): No  .  Lack of Transportation (Non-Medical): No  Physical Activity: Insufficiently Active  . Days of Exercise per Week: 3 days  . Minutes of Exercise per Session: 30 min  Stress: No Stress Concern Present  . Feeling of Stress : Only a little  Social Connections: Moderately Integrated  . Frequency of Communication with Friends and Family: More than three times a week  . Frequency of Social Gatherings with Friends and Family: Three times a week  . Attends Religious Services: More than 4 times per year  . Active Member of Clubs or Organizations: Yes  . Attends Archivist Meetings: More than 4 times per year  . Marital Status: Widowed  Intimate Partner  Violence: Not At Risk  . Fear of Current or Ex-Partner: No  . Emotionally Abused: No  . Physically Abused: No  . Sexually Abused: No    Outpatient Medications Prior to Visit  Medication Sig Dispense Refill  . acetaminophen (TYLENOL) 500 MG tablet Take 2 tablets (1,000 mg total) by mouth 2 (two) times daily. 30 tablet 0  . amLODipine (NORVASC) 5 MG tablet Take 1 tablet by mouth once daily 90 tablet 3  . buPROPion (WELLBUTRIN XL) 150 MG 24 hr tablet Take 1 tablet by mouth once daily 90 tablet 3  . CALCIUM CITRATE PO Take 600 mg by mouth daily.    . cholecalciferol (VITAMIN D3) 25 MCG (1000 UNIT) tablet Take 1,000 Units by mouth daily.    [name] Kitchen ezetimibe (ZETIA) 10 MG tablet Take 1 tablet (10 mg total) by mouth daily. 90 tablet 1  . glucose blood (ONETOUCH ULTRA) test strip Test Blood Sugar twice daily. 100 each 12  . irbesartan (AVAPRO) 300 MG tablet Take 1 tablet by mouth once daily 90 tablet 0  . Lancets 28G MISC 1 each by Does not apply route daily. 100 each 3  . meloxicam (MOBIC) 15 MG tablet Take 1 tablet (15 mg total) by mouth daily. 30 tablet 0  . Multiple Vitamins-Minerals (MULTIVITAMIN ADULTS 50+ PO) Take 1 tablet by mouth daily.    . NON FORMULARY CPAP @@ 12 cm H20    . Omega-3 Fatty Acids (FISH OIL) 1000 MG CAPS Take 1 capsule by mouth daily.    . polyethylene glycol powder (GLYCOLAX/MIRALAX) 17 GM/SCOOP powder Take 17 g by mouth 2 (two) times daily as needed for moderate constipation. 1020 g 1  . REFRESH 1.4-0.6 % SOLN Place 1 drop into both eyes as needed.     No facility-administered medications prior to visit.    Allergies  Allergen Reactions  . Atorvastatin Other (See Comments)    Peripheral neuropathy and myalgia  . Influenza Vaccines Rash   Narrative & Impression  CLINICAL DATA:  80 year old female with right-sided pain for 2 weeks. Right leg numbness and weakness. Cannot stand for a long time. Lumbar surgery 4 years ago. Initial encounter.  EXAM: LUMBAR SPINE -  COMPLETE 4+ VIEW  COMPARISON:  None.  FINDINGS: T10-11 through L3-4 without significant disc space narrowing. Anterior osteophytes at several levels most notable L3-4 level.  L4-5 prominent facet degenerative changes with 3 mm anterior slip L4. Very mild disc space narrowing. Anterior osteophyte.  L5-S1 prominent facet degenerative changes. Mild to moderate disc space narrowing. Anterior osteophyte.  Moderate amount of stool throughout the colon.  IMPRESSION: L4-5 prominent facet degenerative changes with 3 mm anterior slip L4. Very mild disc space narrowing. Anterior osteophyte.  L5-S1 prominent facet degenerative changes. Mild to moderate disc space narrowing. Anterior osteophyte.  Prominent  anterior osteophytes lower thoracic and upper lumbar spine most prominent L3-4 level.   Electronically Signed   By: Genia Del M.D.   On: 06/14/2016 11:17  Independent interpretation reveals severe multilevel lower lumbar intervertebral narrowing and loss of space, prominent facet arthrosis, no acute osseous process identified, limited visualization of bilateral hip joints reveals mild osteoarthritis .    Objective:    Physical Exam  Hip: ROM IR: 15 Deg painful, ER: 30 Deg, Flexion: 100 Deg, Extension: 100 Deg, Abduction: 30 Deg, Adduction: 30 Deg Strength IR: 5/5, ER: 5/5, Flexion: 5/5, Extension: 5/5, Abduction: 5/5, Adduction: 5/5 Pelvic alignment unremarkable to inspection and palpation. Greater trochanter without tenderness to palpation. No tenderness over piriformis. Painless FABER, painful FADIR No SI joint tenderness  BP 124/68 (BP Location: Right Arm, Patient Position: Sitting, Cuff Size: Large)   Pulse 69   Temp 97.9 F (36.6 C) (Oral)   Ht 5\' 7"  (1.702 m)   Wt 212 lb (96.2 kg)   SpO2 96%   BMI 33.20 kg/m  Wt Readings from Last 3 Encounters:  07/23/20 212 lb (96.2 kg)  07/13/20 209 lb (94.8 kg)  07/01/20 208 lb (94.3 kg)    There are no  preventive care reminders to display for this patient.  There are no preventive care reminders to display for this patient.   Lab Results  Component Value Date   TSH 1.730 11/25/2019   Lab Results  Component Value Date   WBC 5.4 06/13/2019   HGB 12.3 06/13/2019   HCT 36.6 06/13/2019   MCV 88 06/13/2019   PLT 201 06/13/2019   Lab Results  Component Value Date   NA 140 11/25/2019   K 4.6 11/25/2019   CO2 26 11/25/2019   GLUCOSE 138 (H) 11/25/2019   BUN 12 11/25/2019   CREATININE 0.99 11/25/2019   BILITOT 0.3 11/25/2019   ALKPHOS 108 11/25/2019   AST 24 11/25/2019   ALT 15 11/25/2019   PROT 7.5 11/25/2019   ALBUMIN 4.2 11/25/2019   CALCIUM 9.7 11/25/2019   Lab Results  Component Value Date   CHOL 271 (H) 03/31/2020   Lab Results  Component Value Date   HDL 61 03/31/2020   Lab Results  Component Value Date   LDLCALC 164 (H) 03/31/2020   Lab Results  Component Value Date   TRIG 247 (H) 03/31/2020   Lab Results  Component Value Date   CHOLHDL 4.4 03/31/2020   Lab Results  Component Value Date   HGBA1C 6.4 (A) 07/13/2020       Assessment & Plan:   Problem List Items Addressed This Visit      Musculoskeletal and Integument   Unilateral primary osteoarthritis, left hip - Primary    Clinical history and radiographic findings are consistent with symptoms focal to the left hip joint in the setting of severe lumbosacral degenerative disease.  That being said, her primary symptoms are related to the hip joint and this will be further evaluated with dedicated plain films, her stated radiating symptoms are most likely due to secondary/compensatory involvement of the lumbosacral spine, anticipate this to improve as her hip pain resolves.  I have performed Rx management with the discussion of her previously prescribed meloxicam, she is to continue this on an as needed basis, plan for return in 1 week to review x-rays and if symptoms persist, can consider  ultrasound-guided intra-articular left hip injection.  Patient is understanding and amenable to this plan moving forward.      Relevant  Orders   DG Hip Unilat W OR W/O Pelvis 2-3 Views Left       No orders of the defined types were placed in this encounter.    Montel Culver, MD

## 2020-07-24 ENCOUNTER — Other Ambulatory Visit: Payer: Self-pay | Admitting: Internal Medicine

## 2020-07-27 ENCOUNTER — Telehealth: Payer: Self-pay

## 2020-07-27 NOTE — Telephone Encounter (Signed)
Patient notified and verbalized understanding.  No further questions at this time.

## 2020-07-27 NOTE — Telephone Encounter (Signed)
-----   Message from Montel Culver, MD sent at 07/27/2020  9:01 AM EDT ----- X-rays show arthritis on both hips - the left hip more so than the right, we can review the images at your upcoming visit on 08/11/2020 but in th meantime, continue meloxicam as-needed until I see you. If symptoms are bad enough despite the meloxicam, contact us to come in sooner.

## 2020-08-11 ENCOUNTER — Encounter: Payer: Self-pay | Admitting: Family Medicine

## 2020-08-11 ENCOUNTER — Ambulatory Visit (INDEPENDENT_AMBULATORY_CARE_PROVIDER_SITE_OTHER): Payer: Medicare HMO | Admitting: Family Medicine

## 2020-08-11 ENCOUNTER — Other Ambulatory Visit: Payer: Self-pay

## 2020-08-11 VITALS — BP 138/68 | HR 83 | Temp 98.1°F | Ht 67.0 in | Wt 211.0 lb

## 2020-08-11 DIAGNOSIS — M1612 Unilateral primary osteoarthritis, left hip: Secondary | ICD-10-CM

## 2020-08-11 MED ORDER — LIDOCAINE HCL (PF) 1 % IJ SOLN
1.0000 mL | Freq: Once | INTRAMUSCULAR | Status: AC
  Administered 2020-08-11: 1 mL via INTRADERMAL

## 2020-08-11 MED ORDER — TRIAMCINOLONE ACETONIDE 40 MG/ML IJ SUSP
40.0000 mg | Freq: Once | INTRAMUSCULAR | Status: AC
  Administered 2020-08-11: 40 mg via INTRAMUSCULAR

## 2020-08-11 NOTE — Progress Notes (Addendum)
New Patient Office Visit  Subjective:  Patient ID: Molly Ruiz, female    DOB: [birth year]  Age: 80 y.o. MRN: [id number]  CC:  Chief Complaint  Patient presents with  . Hip Pain     Left sided hip pain. Started 2 months ago. No previous injections in the past. Pain goes down her leg from her hip. Mild aching- intermittent.     HPI Molly Ruiz presents for Atraumatic left anterolateral hip pain, radiation distally without endorsement of paresthesias or weakness, aggravated by position change, motion after period of relative rest, prolonged standing.  No treatments to date.  She does give a longstanding history of lower back pain treated with brace and rest.  Past Medical History:  Diagnosis Date  . Complication of anesthesia    Hair falls out.  . Depression   . Diabetes mellitus without complication ([place])   . Hyperlipidemia   . Hypertension   . Sleep apnea    CPAP  . Wears dentures    partial upper    Past Surgical History:  Procedure Laterality Date  . ABDOMINAL HYSTERECTOMY    . BILATERAL CARPAL TUNNEL RELEASE    . CATARACT EXTRACTION W/PHACO Right 11/12/2018   Procedure: CATARACT EXTRACTION PHACO AND INTRAOCULAR LENS PLACEMENT ([place])  RIGHT DIABETIC;  Surgeon: Eulogio Bear, MD;  Location: [facility];  Service: Ophthalmology;  Laterality: Right;  Diabetic - diet controlled sleep apnea  . CERVICAL FUSION  2004  . COLONOSCOPY WITH PROPOFOL N/A 07/22/2019   Procedure: COLONOSCOPY WITH BIOPSIES;  Surgeon: Lucilla Lame, MD;  Location: [facility];  Service: Endoscopy;  Laterality: N/A;  Diabetic - diet controlled priority 4  . FOOT SURGERY    . [place] SURGERY  1998  . POLYPECTOMY N/A 07/22/2019   Procedure: POLYPECTOMY;  Surgeon: Lucilla Lame, MD;  Location: [facility];  Service: Endoscopy;  Laterality: N/A;    Family History  Problem Relation Age of Onset  . Diabetes Mother   . Stroke Mother   . Heart disease Sister   . Stroke  Brother   . Prostate cancer Brother   . Breast cancer Other 77    Social History   Socioeconomic History  . Marital status: Widowed    Spouse name: Not on file  . Number of children: 3  . Years of education: 80  . Highest education level: High school graduate  Occupational History  . Occupation: Retired  Tobacco Use  . Smoking status: Never Smoker  . Smokeless tobacco: Never Used  Vaping Use  . Vaping Use: Never used  Substance and Sexual Activity  . Alcohol use: No  . Drug use: No  . Sexual activity: Not Currently  Other Topics Concern  . Not on file  Social History Narrative   Pt lives with her daughter   Social Determinants of Health   Financial Resource Strain: Low Risk   . Difficulty of Paying Living Expenses: Not very hard  Food Insecurity: No Food Insecurity  . Worried About Charity fundraiser in the Last Year: Never true  . Ran Out of Food in the Last Year: Never true  Transportation Needs: No Transportation Needs  . Lack of Transportation (Medical): No  . Lack of Transportation (Non-Medical): No  Physical Activity: Insufficiently Active  . Days of Exercise per Week: 3 days  . Minutes of Exercise per Session: 30 min  Stress: No Stress Concern Present  . Feeling of Stress : Only a little  Social Connections: Moderately Integrated  . Frequency of Communication with Friends and Family: More than three times a week  . Frequency of Social Gatherings with Friends and Family: Three times a week  . Attends Religious Services: More than 4 times per year  . Active Member of Clubs or Organizations: Yes  . Attends Archivist Meetings: More than 4 times per year  . Marital Status: Widowed  Intimate Partner Violence: Not At Risk  . Fear of Current or Ex-Partner: No  . Emotionally Abused: No  . Physically Abused: No  . Sexually Abused: No    ROS Review of Systems pertinent per HPI, A&P  Objective:   CLINICAL DATA:  Left groin pain.  EXAM: DG HIP  (WITH OR WITHOUT PELVIS) 2-3V LEFT  COMPARISON:  None.  FINDINGS: There is moderate bilateral hip osteoarthritis. There is osteopenia which limits detection of nondisplaced fractures. There is no acute displaced fracture. There are calcifications projecting about the left hip which may represent myositis ossificans versus multiple intra-articular loose bodies.  IMPRESSION: Moderate bilateral hip osteoarthritis. No acute displaced fracture.   Electronically Signed   By: Constance Holster M.D.   On: 07/25/2020 14:01  Independent interpretation reveals bilateral left greater than right hip osteoarthritis moderate to severe in nature, osteophytes present bilaterally at the acetabulum, more prominent at the left hip, right greater than left greater trochanteric enthesophyte formation, limited visualization of the lower lumbar spine reveals significant sclerosis and degenerative changes, no acute osseous process noted  Today's Vitals: BP 138/68   Pulse 83   Temp 98.1 F (36.7 C) (Oral)   Ht 5\' 7"  (1.702 m)   Wt 211 lb (95.7 kg)   SpO2 98%   BMI 33.05 kg/m   Physical Exam pertinent details per A&P  Procedure:  Injection of left intra-articular hip joint under ultrasound guidance with permanent image storage; femoral head/neck junction visualized and targeted for administration Consent obtained and verified. Time-out conducted. Noted no overlying erythema, induration, or other signs of local infection. Skin prepped in a sterile fashion. Topical analgesic spray: Ethyl chloride. Completed without difficulty. Meds: triamcinolone 40mg /mL 67mL administered Advised to call if fevers/chills, erythema, induration, drainage, or persistent bleeding.     Assessment & Plan:   Problem List Items Addressed This Visit      Musculoskeletal and Integument   Primary osteoarthritis of left hip - Primary    Patient with clinical and radiographic features consistent with left greater than  right osteoarthritis, examination reveals reproduction of pain with internal rotation, nontender greater trochanteric bursa, gluteal muscles, paraspinal region, negative straight leg raise.  Given her comorbid medical conditions and associated relative risks she did elect to proceed with ultrasound-guided left hip intra-articular injection.  I have also written for formal physical therapy to oversee a primarily home-based program.  She is to contact us for questions and follow-up on as-needed basis.  Injection can be repeated in 3 months if clinically dictated.         Outpatient Encounter Medications as of 08/11/2020  Medication Sig  . acetaminophen (TYLENOL) 500 MG tablet Take 2 tablets (1,000 mg total) by mouth 2 (two) times daily.  [name] Kitchen buPROPion (WELLBUTRIN XL) 150 MG 24 hr tablet Take 1 tablet by mouth once daily  . CALCIUM CITRATE PO Take 600 mg by mouth daily.  . cholecalciferol (VITAMIN D3) 25 MCG (1000 UNIT) tablet Take 1,000 Units by mouth daily.  [name] Kitchen ezetimibe (ZETIA) 10 MG tablet Take 1 tablet (10 mg total) by  mouth daily.  [name] Kitchen glucose blood (ONETOUCH ULTRA) test strip Test Blood Sugar twice daily.  . irbesartan (AVAPRO) 300 MG tablet Take 1 tablet by mouth once daily  . Lancets 28G MISC 1 each by Does not apply route daily.  . meloxicam (MOBIC) 15 MG tablet Take 1 tablet (15 mg total) by mouth daily.  . Multiple Vitamins-Minerals (MULTIVITAMIN ADULTS 50+ PO) Take 1 tablet by mouth daily.  . NON FORMULARY CPAP @@ 12 cm H20  . Omega-3 Fatty Acids (FISH OIL) 1000 MG CAPS Take 1 capsule by mouth daily.  . polyethylene glycol powder (GLYCOLAX/MIRALAX) 17 GM/SCOOP powder Take 17 g by mouth 2 (two) times daily as needed for moderate constipation.  [name] Kitchen REFRESH 1.4-0.6 % SOLN Place 1 drop into both eyes as needed.  . [DISCONTINUED] amLODipine (NORVASC) 5 MG tablet Take 1 tablet by mouth once daily   No facility-administered encounter medications on file as of 08/11/2020.    Follow-up: No  follow-ups on file.   Montel Culver, MD

## 2020-08-11 NOTE — Addendum Note (Signed)
Addended by: Clista Bernhardt on: 08/11/2020 03:31 PM   Modules accepted: Orders

## 2020-08-11 NOTE — Assessment & Plan Note (Signed)
Patient with clinical and radiographic features consistent with left greater than right osteoarthritis, examination reveals reproduction of pain with internal rotation, nontender greater trochanteric bursa, gluteal muscles, paraspinal region, negative straight leg raise.  Given her comorbid medical conditions and associated relative risks she did elect to proceed with ultrasound-guided left hip intra-articular injection.  I have also written for formal physical therapy to oversee a primarily home-based program.  She is to contact us for questions and follow-up on as-needed basis.  Injection can be repeated in 3 months if clinically dictated.

## 2020-08-11 NOTE — Patient Instructions (Signed)
You have just been given a cortisone injection to reduce pain and inflammation. After the injection you may notice immediate relief of pain as a result of the Lidocaine. It is important to rest the area of the injection for 24 to 48 hours after the injection. There is a possibility of some temporary increased discomfort and swelling for up to 72 hours until the cortisone begins to work. If you do have pain, simply rest the joint and use ice. If you can tolerate over the counter medications, you can try Tylenol, Aleve, or Advil for added relief per package instructions.   -Relative rest x2 days then gradual return to normal activity -Start physical therapy with the referral provided, they will oversee a primarily home-based program -Contact us for any questions and follow-up as needed

## 2020-08-18 ENCOUNTER — Telehealth: Payer: Self-pay

## 2020-08-18 ENCOUNTER — Ambulatory Visit: Payer: Medicare HMO | Admitting: Physical Therapy

## 2020-08-18 ENCOUNTER — Other Ambulatory Visit: Payer: Self-pay | Admitting: Internal Medicine

## 2020-08-18 DIAGNOSIS — I10 Essential (primary) hypertension: Secondary | ICD-10-CM

## 2020-08-18 NOTE — Chronic Care Management (AMB) (Signed)
    Chronic Care Management Pharmacy Assistant   Name: Molly Ruiz  MRN: [id number] DOB: 02-Mar-1941  Reason for Encounter: General Adherence Call   Recent office visits:  08/11/20- [name], MD- Hip pain, referral for PT 07/23/20- [name], MD- Unilateral primary osteoarthritis, left hip, x-rays done, follow up 1 week  07/13/20- [name], MD- HTN and DM, no medication changes indicated 07/01/20- [name], MD- back pain, started meloxicam 15mg  daily, follow up 2 weeks 03/31/20- [name], MD- DM and HTN follow up, started Zetia 10 mg daily   Recent consult visits:  No visits noted  Hospital visits:  None in previous 6 months  Medications: Outpatient Encounter Medications as of 08/18/2020  Medication Sig  . acetaminophen (TYLENOL) 500 MG tablet Take 2 tablets (1,000 mg total) by mouth 2 (two) times daily.  [name] Kitchen amLODipine (NORVASC) 5 MG tablet Take 1 tablet by mouth once daily  . buPROPion (WELLBUTRIN XL) 150 MG 24 hr tablet Take 1 tablet by mouth once daily  . CALCIUM CITRATE PO Take 600 mg by mouth daily.  . cholecalciferol (VITAMIN D3) 25 MCG (1000 UNIT) tablet Take 1,000 Units by mouth daily.  [name] Kitchen ezetimibe (ZETIA) 10 MG tablet Take 1 tablet (10 mg total) by mouth daily.  [name] Kitchen glucose blood (ONETOUCH ULTRA) test strip Test Blood Sugar twice daily.  . irbesartan (AVAPRO) 300 MG tablet Take 1 tablet by mouth once daily  . Lancets 28G MISC 1 each by Does not apply route daily.  . meloxicam (MOBIC) 15 MG tablet Take 1 tablet (15 mg total) by mouth daily.  . Multiple Vitamins-Minerals (MULTIVITAMIN ADULTS 50+ PO) Take 1 tablet by mouth daily.  . NON FORMULARY CPAP @@ 12 cm H20  . Omega-3 Fatty Acids (FISH OIL) 1000 MG CAPS Take 1 capsule by mouth daily.  . polyethylene glycol powder (GLYCOLAX/MIRALAX) 17 GM/SCOOP powder Take 17 g by mouth 2 (two) times daily as needed for moderate constipation.  [name] Kitchen REFRESH 1.4-0.6 % SOLN Place 1 drop into both eyes as needed.    No facility-administered encounter medications on file as of 08/18/2020.   Have you had any problems recently with your health? Patient stated she has been experiencing some acid reflux that wakes her up at night and bothers her throughout the day. I encouraged [name] to call her provider to find a treatment option to help her.  Have you had any problems with your pharmacy? Patient stated that she only would like the cost of her Wellbutrin to go down.  What issues or side effects are you having with your medications? Patient stated she currently has no side effects from medication  What would you like me to pass along to [place] for them to help you with?  Patient would not like to pass on anything other than her rx cost and acid reflux concerns  What can we do to take care of you better? Patient had no suggestions  Star Rating Drugs: Irbesartan 300 mg- 90 DS last filled 05/20/20  [facility] CPA, CMA

## 2020-08-20 ENCOUNTER — Ambulatory Visit: Payer: Medicare HMO | Admitting: Physical Therapy

## 2020-08-25 ENCOUNTER — Encounter: Payer: Medicare HMO | Admitting: Physical Therapy

## 2020-08-25 ENCOUNTER — Other Ambulatory Visit: Payer: Self-pay | Admitting: Internal Medicine

## 2020-08-25 ENCOUNTER — Ambulatory Visit (INDEPENDENT_AMBULATORY_CARE_PROVIDER_SITE_OTHER): Payer: Medicare HMO | Admitting: Pharmacist

## 2020-08-25 DIAGNOSIS — E785 Hyperlipidemia, unspecified: Secondary | ICD-10-CM

## 2020-08-25 DIAGNOSIS — I10 Essential (primary) hypertension: Secondary | ICD-10-CM

## 2020-08-25 DIAGNOSIS — E1169 Type 2 diabetes mellitus with other specified complication: Secondary | ICD-10-CM

## 2020-08-25 DIAGNOSIS — E118 Type 2 diabetes mellitus with unspecified complications: Secondary | ICD-10-CM | POA: Diagnosis not present

## 2020-08-25 DIAGNOSIS — M1612 Unilateral primary osteoarthritis, left hip: Secondary | ICD-10-CM

## 2020-08-25 MED ORDER — MELOXICAM 15 MG PO TABS: 15.0000 mg | ORAL_TABLET | Freq: Every day | ORAL | 0 refills | Status: DC

## 2020-08-25 NOTE — Patient Instructions (Addendum)
Visit Information  It was a pleasure speaking with you today. Thank you for letting me be part of your clinical team. Please call with any questions or concerns.   Goals Addressed            This Visit's Progress   . Monitor and Manage My Blood Sugar-Diabetes Type 2       Timeframe:  Long-Range Goal Priority:  High Start Date:                             Expected End Date:                       Follow Up Date 2 month follow up   - check blood sugar before and after exercise - check blood sugar if I feel it is too high or too low - take the blood sugar meter to all doctor visits    Why is this important?    Checking your blood sugar at home helps to keep it from getting very high or very low.   Writing the results in a diary or log helps the doctor know how to care for you.   Your blood sugar log should have the time, date and the results.   Also, write down the amount of insulin or other medicine that you take.   Other information, like what you ate, exercise done and how you were feeling, will also be helpful.     Notes:     . Pharmacy Care Plan       CARE PLAN ENTRY (see longitudinal plan of care for additional care plan information)  Current Barriers:  . Chronic Disease Management support, education, and care coordination needs related to Hypertension, Hyperlipidemia, Diabetes, Anxiety, and Osteopenia   Hypertension BP Readings from Last 3 Encounters:  08/11/20 138/68  07/23/20 124/68  07/13/20 140/66   . Pharmacist Clinical Goal(s): o Over the next 90 days, patient will work with PharmD and providers to maintain BP goal <130/80 . Current regimen:  . Amlodipine 5 mg qd . Irbesartan 300 mg qd . Interventions: . Comprehensive medication review performed, medication list updated in electronic medical record  . Patient self care activities - Over the next 90 days, patient will: o Check BP daily, document, and provide at future appointments o Ensure daily salt  intake < 2300 mg/ o Continue exercising 30 min/day   Hyperlipidemia Lab Results  Component Value Date/Time   LDLCALC 164 (H) 03/31/2020 11:45 AM   . Pharmacist Clinical Goal(s): o Over the next 90 days, patient will work with PharmD and providers to achieve LDL goal < 70  . Current regimen:  o Fish Oil 1000 mg daily  . Interventions: o Discussed options other than statin to help manage cholesterol. Discuss with PCP next visit. . Patient self care activities - Over the next 90 days, patient will: o Continue exercise regimen and diet adherence     Diabetes Lab Results  Component Value Date/Time   HGBA1C 6.4 (A) 07/13/2020 09:51 AM   HGBA1C 7.0 (H) 03/31/2020 11:45 AM   HGBA1C 7.1 (H) 11/25/2019 10:46 AM   . Pharmacist Clinical Goal(s): o Over the next 90 days, patient will work with PharmD and providers to achieve A1c goal <7% . Current regimen:  o Diet and exercise . Interventions: o Discussed A1c and fasting blood glucose levels.  o Discussed adverse effects of elevated glucose levels  and recommend patient consider medication at next appointment. [name] Kitchen .Comprehensive medication review performed, medication list updated in electronic medical record . Patient self care activities - Over the next 90 days, patient will: o Check blood sugar once daily, document, and provide at future appointments o Contact provider with any episodes of hypoglycemia  Medication management . Pharmacist Clinical Goal(s): o Over the next 90 days, patient will work with PharmD and providers to achieve optimal medication adherence . Current pharmacy: [facility] . Interventions o Comprehensive medication review performed. o Continue current medication management strategy . Patient self care activities - Over the next  90 days, patient will: o Focus on medication adherence by fill dates o Take medications as prescribed o Report any questions or concerns to PharmD and/or provider(s)  Initial goal  documentation     . Set My Target A1C-Diabetes Type 2       Timeframe:  Long-Range Goal Priority:  High Start Date:                             Expected End Date:                       Follow Up Date 2 month follow up    - set target A1C    Why is this important?    Your target A1C is decided together by you and your doctor.   It is based on several things like your age and other health issues.    Notes:     . Track and Manage My Blood Pressure-Hypertension       Timeframe:  Long-Range Goal Priority:  High Start Date:                             Expected End Date:                       Follow Up Date 2 month follow up    - check blood pressure daily - write blood pressure results in a log or diary    Why is this important?    You won't feel high blood pressure, but it can still hurt your blood vessels.   High blood pressure can cause heart or kidney problems. It can also cause a stroke.   Making lifestyle changes like losing a little weight or eating less salt will help.   Checking your blood pressure at home and at different times of the day can help to control blood pressure.   If the doctor prescribes medicine remember to take it the way the doctor ordered.   Call the office if you cannot afford the medicine or if there are questions about it.     Notes:        The patient verbalized understanding of instructions, educational materials, and care plan provided today and agreed to receive a mailed copy of patient instructions, educational materials, and care plan.   Telephone follow up appointment with pharmacy team member scheduled for: 1 month CPA  Junita Push. [place] PharmD, BCPS Clinical Pharmacist [phone]  High Cholesterol  High cholesterol is a condition in which the blood has high levels of a white, waxy substance similar to fat (cholesterol). The liver makes all the cholesterol that the body needs. The human body needs small amounts of  cholesterol to help build cells. A person  gets extra or excess cholesterol from the food that he or she eats. The blood carries cholesterol from the liver to the rest of the body. If you have high cholesterol, deposits (plaques) may build up on the walls of your arteries. Arteries are the blood vessels that carry blood away from your heart. These plaques make the arteries narrow and stiff. Cholesterol plaques increase your risk for heart attack and stroke. Work with your health care provider to keep your cholesterol levels in a healthy range. What increases the risk? The following factors may make you more likely to develop this condition:  Eating foods that are high in animal fat (saturated fat) or cholesterol.  Being overweight.  Not getting enough exercise.  A family history of high cholesterol (familial hypercholesterolemia).  Use of tobacco products.  Having diabetes. What are the signs or symptoms? There are no symptoms of this condition. How is this diagnosed? This condition may be diagnosed based on the results of a blood test.  If you are older than 80 years of age, your health care provider may check your cholesterol levels every 4-6 years.  You may be checked more often if you have high cholesterol or other risk factors for heart disease. The blood test for cholesterol measures:  "Bad" cholesterol, or LDL cholesterol. This is the main type of cholesterol that causes heart disease. The desired level is less than 100 mg/dL.  "Good" cholesterol, or HDL cholesterol. HDL helps protect against heart disease by cleaning the arteries and carrying the LDL to the liver for processing. The desired level for HDL is 60 mg/dL or higher.  Triglycerides. These are fats that your body can store or burn for energy. The desired level is less than 150 mg/dL.  Total cholesterol. This measures the total amount of cholesterol in your blood and includes LDL, HDL, and triglycerides. The desired  level is less than 200 mg/dL. How is this treated? This condition may be treated with:  Diet changes. You may be asked to eat foods that have more fiber and less saturated fats or added sugar.  Lifestyle changes. These may include regular exercise, maintaining a healthy weight, and quitting use of tobacco products.  Medicines. These are given when diet and lifestyle changes have not worked. You may be prescribed a statin medicine to help lower your cholesterol levels. Follow these instructions at home: Eating and drinking  Eat a healthy, balanced diet. This diet includes: ? Daily servings of a variety of fresh, frozen, or canned fruits and vegetables. ? Daily servings of whole grain foods that are rich in fiber. ? Foods that are low in saturated fats and trans fats. These include poultry and fish without skin, lean cuts of meat, and low-fat dairy products. ? A variety of fish, especially oily fish that contain omega-3 fatty acids. Aim to eat fish at least 2 times a week.  Avoid foods and drinks that have added sugar.  Use healthy cooking methods, such as roasting, grilling, broiling, baking, poaching, steaming, and stir-frying. Do not fry your food except for stir-frying.   Lifestyle  Get regular exercise. Aim to exercise for a total of 150 minutes a week. Increase your activity level by doing activities such as gardening, walking, and taking the stairs.  Do not use any products that contain nicotine or tobacco, such as cigarettes, e-cigarettes, and chewing tobacco. If you need help quitting, ask your health care provider.   General instructions  Take over-the-counter and prescription medicines only as  told by your health care provider.  Keep all follow-up visits as told by your health care provider. This is important. Where to find more information  American Heart Association: www.heart.org  National Heart, Lung, and Blood Institute: https://wilson-eaton.com/ Contact a health care  provider if:  You have trouble achieving or maintaining a healthy diet or weight.  You are starting an exercise program.  You are unable to stop smoking. Get help right away if:  You have chest pain.  You have trouble breathing.  You have any symptoms of a stroke. "BE FAST" is an easy way to remember the main warning signs of a stroke: ? B - Balance. Signs are dizziness, sudden trouble walking, or loss of balance. ? E - Eyes. Signs are trouble seeing or a sudden change in vision. ? F - Face. Signs are sudden weakness or numbness of the face, or the face or eyelid drooping on one side. ? A - Arms. Signs are weakness or numbness in an arm. This happens suddenly and usually on one side of the body. ? S - Speech. Signs are sudden trouble speaking, slurred speech, or trouble understanding what people say. ? T - Time. Time to call emergency services. Write down what time symptoms started.  You have other signs of a stroke, such as: ? A sudden, severe headache with no known cause. ? Nausea or vomiting. ? Seizure. These symptoms may represent a serious problem that is an emergency. Do not wait to see if the symptoms will go away. Get medical help right away. Call your local emergency services (911 in the [place] Do not drive yourself to the hospital. Summary  Cholesterol plaques increase your risk for heart attack and stroke. Work with your health care provider to keep your cholesterol levels in a healthy range.  Eat a healthy, balanced diet, get regular exercise, and maintain a healthy weight.  Do not use any products that contain nicotine or tobacco, such as cigarettes, e-cigarettes, and chewing tobacco.  Get help right away if you have any symptoms of a stroke. This information is not intended to replace advice given to you by your health care provider. Make sure you discuss any questions you have with your health care provider. Document Revised: 03/25/2019 Document Reviewed:  03/25/2019 Elsevier Patient Education  2021 [employer].

## 2020-08-25 NOTE — Progress Notes (Signed)
Chronic Care Management Pharmacy Note  08/25/2020 Name:  Molly Ruiz MRN:  [id number] DOB:  1941-01-17  Subjective: Molly Ruiz is an 80 y.o. year old female who is a primary patient of Army Melia, Jesse Sans, MD.  The CCM team was consulted for assistance with disease management and care coordination needs.    Engaged with patient by telephone for follow up visit in response to provider referral for pharmacy case management and/or care coordination services.   Consent to Services:  The patient was given information about Chronic Care Management services, agreed to services, and gave verbal consent prior to initiation of services.  Please see initial visit note for detailed documentation.   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 continue Mobic 07/13/20- berglund- A1c 6.4 07/01/20- Berglund- meloxicam 15 mg , l back/hip pain  03/31/20-Berglund- Zetia 28m , DM eye exam, blood work   Recent consult visits: 06/08/20- DEXA 04/17/21- Breast [place] CAD/tomo L breast- no amlignancy,  04/01/20- mammogram--needs diagnostic  Hospital visits: None in previous 6 months  Objective:  Lab Results  Component Value Date   CREATININE 0.99 11/25/2019   BUN 12 11/25/2019   GFRNONAA 55 (L) 11/25/2019   GFRAA 63 11/25/2019   NA 140 11/25/2019   K 4.6 11/25/2019   CALCIUM 9.7 11/25/2019   CO2 26 11/25/2019   GLUCOSE 138 (H) 11/25/2019    Lab Results  Component Value Date/Time   HGBA1C 6.4 (A) 07/13/2020 09:51 AM   HGBA1C 7.0 (H) 03/31/2020 11:45 AM   HGBA1C 7.1 (H) 11/25/2019 10:46 AM    Last diabetic Eye exam:  Lab Results  Component Value Date/Time   HMDIABEYEEXA No Retinopathy 10/25/2019 12:00 AM    Last diabetic  Foot exam: No results found for: HMDIABFOOTEX   Lab Results  Component Value Date   CHOL 271 (H) 03/31/2020   HDL 61 03/31/2020   LDLCALC 164 (H) 03/31/2020   TRIG 247 (H) 03/31/2020   CHOLHDL 4.4 03/31/2020    Hepatic Function Latest Ref Rng & Units 11/25/2019 06/13/2019 04/26/2018  Total Protein 6.0 - 8.5 g/dL 7.5 7.5 7.7  Albumin 3.7 - 4.7 g/dL 4.2 4.0 4.3  AST 0 - 40 IU/L 24 21 16   ALT 0 - 32 IU/L 15 13 13   Alk Phosphatase 48 - 121 IU/L 108 109 116  Total Bilirubin 0.0 - 1.2 mg/dL 0.3 0.4 0.3    Lab Results  Component Value Date/Time   TSH 1.730 11/25/2019 10:46 AM   TSH 2.390 06/13/2019 09:53 AM    CBC Latest Ref Rng & Units 06/13/2019 04/26/2018 05/19/2017  WBC 3.4 - 10.8 x10E3/uL 5.4 4.6 4.2  Hemoglobin 11.1 - 15.9 g/dL 12.3 13.0 12.9  Hematocrit 34.0 - 46.6 % 36.6 37.9 38.2  Platelets 150 - 450 x10E3/uL 201 193 215    Lab Results  Component Value Date/Time   VD25OH 25.8 (L) 08/17/2017 11:09 AM   VD25OH 19.2 (L) 05/19/2017 11:48 AM    Clinical ASCVD: No  The 10-year ASCVD risk score (Mikey BussingDC Jr., et al., 2013) is: 53%   Values used to calculate the score:     Age: [age]years     Sex: Female     Is Non-Hispanic African American: Yes     Diabetic: Yes  Tobacco smoker: No     Systolic Blood Pressure: 824 mmHg     Is BP treated: Yes     HDL Cholesterol: 61 mg/dL     Total Cholesterol: 271 mg/dL    Depression screen [facility] 2/9 08/11/2020 07/23/2020 07/01/2020  Decreased Interest 0 0 0  Down, Depressed, Hopeless 0 0 0  PHQ - 2 Score 0 0 0  Altered sleeping 0 0 0  Tired, decreased energy 0 0 0  Change in appetite 0 0 0  Feeling bad or failure about yourself  0 0 0  Trouble concentrating 0 0 0  Moving slowly or fidgety/restless 0 0 0  Suicidal thoughts 0 0 0  PHQ-9 Score 0 0 0  Difficult doing work/chores Not difficult at all Not difficult at all -  Some recent data might be hidden      Social History   Tobacco Use  Smoking Status Never Smoker  Smokeless  Tobacco Never Used   BP Readings from Last 3 Encounters:  08/11/20 138/68  07/23/20 124/68  07/13/20 140/66   Pulse Readings from Last 3 Encounters:  08/11/20 83  07/23/20 69  07/13/20 69   Wt Readings from Last 3 Encounters:  08/11/20 211 lb (95.7 kg)  07/23/20 212 lb (96.2 kg)  07/13/20 209 lb (94.8 kg)   BMI Readings from Last 3 Encounters:  08/11/20 33.05 kg/m  07/23/20 33.20 kg/m  07/13/20 32.73 kg/m    Assessment/Interventions: Review of patient past medical history, allergies, medications, health status, including review of consultants reports, laboratory and other test data, was performed as part of comprehensive evaluation and provision of chronic care management services.   SDOH:  (Social Determinants of Health) assessments and interventions performed: No  SDOH Screenings   Alcohol Screen: Not on file  Depression (PHQ2-9): Low Risk   . PHQ-2 Score: 0  Financial Resource Strain: Low Risk   . Difficulty of Paying Living Expenses: Not very hard  Food Insecurity: No Food Insecurity  . Worried About Charity fundraiser in the Last Year: Never true  . Ran Out of Food in the Last Year: Never true  Housing: Low Risk   . Last Housing Risk Score: 0  Physical Activity: Insufficiently Active  . Days of Exercise per Week: 3 days  . Minutes of Exercise per Session: 30 min  Social Connections: Moderately Integrated  . Frequency of Communication with Friends and Family: More than three times a week  . Frequency of Social Gatherings with Friends and Family: Three times a week  . Attends Religious Services: More than 4 times per year  . Active Member of Clubs or Organizations: Yes  . Attends Archivist Meetings: More than 4 times per year  . Marital Status: Widowed  Stress: No Stress Concern Present  . Feeling of Stress : Only a little  Tobacco Use: Low Risk   . Smoking Tobacco Use: Never Smoker  . Smokeless Tobacco Use: Never Used  Transportation Needs: No  Transportation Needs  . Lack of Transportation (Medical): No  . Lack of Transportation (Non-Medical): No      Immunization History  Administered Date(s) Administered  . PFIZER(Purple Top)SARS-COV-2 Vaccination 07/04/2019, 07/30/2019, 03/21/2020  . Pneumococcal Conjugate-13 06/24/2016  . Pneumococcal Polysaccharide-23 08/23/2017  . Tdap 06/24/2016    Conditions to be addressed/monitored:  Hypertension, Hyperlipidemia, Diabetes, GERD, Anxiety, Osteopenia and Osteoarthritis  Care Plan : [place]  Updates made by Vladimir Faster, [place] since 08/25/2020 12:00 AM  Problem: DM, HTN, HLD, Osteopenia, osteoarthritis,? CKD Resolved 08/25/2020  Priority: High    Problem: DM, HTN, HLD, osteoarthritis, osteopenia, ? CKD   Priority: High    Long-Range Goal: Disease Management   Start Date: 08/25/2020  This Visit's Progress: On track  Priority: High  Note:   Current Barriers:  . Unable to independently monitor therapeutic efficacy . Unable to achieve control of hyperlipidemia  . Suboptimal therapeutic regimen for diabetes . Does not contact provider office for questions/concerns   Pharmacist Clinical Goal(s):  [name] Kitchen Patient will achieve adherence to monitoring guidelines and medication adherence to achieve therapeutic efficacy . achieve control of Hyperlipidemia and diabetes as evidenced by lab values . maintain control of blood pressure as evidenced by readings  . contact provider office for questions/concerns as evidenced notation of same in electronic health record through collaboration with PharmD and provider.    Interventions: . 1:1 collaboration with Glean Hess, MD regarding development and update of comprehensive plan of care as evidenced by provider attestation and co-signature . Inter-disciplinary care team collaboration (see longitudinal plan of care) . Comprehensive medication review performed; medication list updated in electronic medical  record  Osteoporosis / Osteopenia (Goal prevent fracture) -Not ideally controlled -Last DEXA Scan: 06/08/20   T-Score total hip: 1.6   T-Score forearm radius: -1.7  10-year probability of major osteoporotic fracture: 1.4%  10-year probability of hip fracture: 0% -Patient is not a candidate for pharmacologic treatment -Current treatment  . Calcium 600 mg tid . Vitamin d 2000 un qd -Medications previously tried: NA  -Recommend [phone] units of vitamin D daily. Recommend 1200 mg of calcium daily from dietary and supplemental sources. Recommend weight-bearing and muscle strengthening exercises for building and maintaining bone density. -Counseled on diet and exercise extensively Recommended to continue current medication Counseled on recommended Ca and vitamin doses Recommended follow up level   The 10-year ASCVD risk score Mikey Bussing DC Jr., et al., 2013) is: 53% Lipid Panel     Component Value Date/Time   CHOL 271 (H) 03/31/2020 1145   TRIG 247 (H) 03/31/2020 1145   HDL 61 03/31/2020 1145   CHOLHDL 4.4 03/31/2020 1145   LDLCALC 164 (H) 03/31/2020 1145   LABVLDL 46 (H) 03/31/2020 1145     Hyperlipidemia: (LDL goal < 70) -Uncontrolled -Current treatment: . Zetia 10 mg qd . Fish oil 1000 mg qd -Medications previously tried: multiople statins- myalgias -Current dietary patterns: eats helathy has lost 9 pounds -Current exercise habits: Will resume 20 min/day exercise bike and 10 min/day treadmill -Educated on Cholesterol goals;  Importance of limiting foods high in cholesterol; Exercise goal of 150 minutes per week; PSCK9 inhibitors for cholesterol management. -Counseled on diet and exercise extensively Recommended to continue current medication Counseled on PCSK-9 inhibitor if lipids still not at goal to decrease ASCVD risk. Recommended start [place] if lipids elevated at next check Assessed patient finances. Can pursue [employer] grant to [place] co-pay.  Hypertension (BP  goal <140/90) -Not ideally controlled -Current treatment: . Amlodipine 5 mg qd . Irbesartan 300 mg qd -Medications previously tried: na  -Current home readings: 148/68, 145/ 68-70, 138/68, 134/ 69  -Denies hypotensive/hypertensive symptoms -Educated on BP goals and benefits of medications for prevention of heart attack, stroke and kidney damage; Daily salt intake goal < 2300 mg; Exercise goal of 150 minutes per week; Importance of home blood pressure monitoring; Recent steroid injection and stress over elvated BG could impact BP readings -Counseled to monitor BP at home daily, document, and  provide log at future appointments -Recommended to continue current medication  Diabetes (A1c goal <7%) -Controlled -Current medications: . Diet and exercise -Medications previously tried: none  -Current home glucose readings . fasting glucose: 148 today -Reports hypoglycemic/hyperglycemic symptoms. Reports BG reading >300 after steroid hip injection. Patient is very concerned about maintaining goal BG --Educated on A1c and blood sugar goals; Complications of diabetes including kidney damage, retinal damage, and cardiovascular disease; Exercise goal of 150 minutes per week; Benefits of routine self-monitoring of blood sugar; -Counseled to check feet daily and get yearly eye exams -Recommended consider starting Farxiga for renal protection and diabetes.  Assessed patient finances. Can utilize fre month voucher and pursue PAP.  GERD (Goal: reduced symptoms) -Not ideally controlled -Current treatment  . NONE -Medications previously tried: NA -Recommended patient avoid eating within 3 hours of bedtime. Recommend she try Tums or Rolaids for acute episodes of indigestion and discuss RX options with PCP if symptoms worsen or become persistent.  Osteoarthritis bilat hip (Goal: reduce symptoms) -Not ideally controlled -Current treatment  . Acetaminophen prn -Medications previously tried:  meloxicam, intraarticular injection  -Counseled on side effects on long term NSAID use  - patient has not been to PT this week due to financial concerns of copay.  She plans to start next week.    Patient Goals/Self-Care Activities . Patient will:  - take medications as prescribed check glucose daily  and when feeling symptomatic, document, and provide at future appointments check blood pressure dailyu, document, and provide at future appointments collaborate with provider on medication access solutions target a minimum of 150 minutes of moderate intensity exercise weekly  Follow Up Plan: Telephone follow up appointment with care management team member scheduled for: 2 months        Medication Assistance: None required.  Patient affirms current coverage meets needs.  Patient's preferred pharmacy is:  [facility] [address] [place] [address] Phone: [phone] Fax: [phone]  Uses pill box? Yes Pt endorses 90% compliance  We discussed: Benefits of medication synchronization, packaging and delivery as well as enhanced pharmacist oversight with [employer]. Patient decided to: Continue current medication management strategy  Care Plan and Follow Up Patient Decision:  Patient agrees to Care Plan and Follow-up.  Plan: Telephone follow up appointment with care management team member scheduled for:  2 months Pharm D, 1 month CPA  Junita Push. Kenton Kingfisher PharmD, [facility] [phone]

## 2020-08-27 ENCOUNTER — Encounter: Payer: Medicare HMO | Admitting: Physical Therapy

## 2020-09-01 ENCOUNTER — Encounter: Payer: Medicare HMO | Admitting: Physical Therapy

## 2020-09-08 ENCOUNTER — Encounter: Payer: Medicare HMO | Admitting: Physical Therapy

## 2020-09-10 ENCOUNTER — Encounter: Payer: Medicare HMO | Admitting: Physical Therapy

## 2020-09-14 ENCOUNTER — Telehealth: Payer: Self-pay | Admitting: Pharmacist

## 2020-09-14 NOTE — Chronic Care Management (AMB) (Signed)
    Chronic Care Management Pharmacy Assistant   Name: Molly Ruiz  MRN: [id number] DOB: Jul 10, 1940   Reason for Encounter: Disease State Diabetes Mellitus    Recent office visits:  None noted  Recent consult visits:  None noted  Hospital visits:  None in previous 6 months  Medications: Outpatient Encounter Medications as of 09/14/2020  Medication Sig  . acetaminophen (TYLENOL) 500 MG tablet Take 2 tablets (1,000 mg total) by mouth 2 (two) times daily.  [name] Kitchen amLODipine (NORVASC) 5 MG tablet Take 1 tablet by mouth once daily  . buPROPion (WELLBUTRIN XL) 150 MG 24 hr tablet Take 1 tablet by mouth once daily  . CALCIUM CITRATE PO Take 600 mg by mouth daily.  . cholecalciferol (VITAMIN D3) 25 MCG (1000 UNIT) tablet Take 1,000 Units by mouth daily.  [name] Kitchen ezetimibe (ZETIA) 10 MG tablet Take 1 tablet (10 mg total) by mouth daily.  [name] Kitchen glucose blood (ONETOUCH ULTRA) test strip Test Blood Sugar twice daily.  . irbesartan (AVAPRO) 300 MG tablet Take 1 tablet by mouth once daily  . Lancets 28G MISC 1 each by Does not apply route daily.  . meloxicam (MOBIC) 15 MG tablet Take 1 tablet (15 mg total) by mouth daily.  . Multiple Vitamins-Minerals (MULTIVITAMIN ADULTS 50+ PO) Take 1 tablet by mouth daily.  . NON FORMULARY CPAP @@ 12 cm H20  . Omega-3 Fatty Acids (FISH OIL) 1000 MG CAPS Take 1 capsule by mouth daily.  . polyethylene glycol powder (GLYCOLAX/MIRALAX) 17 GM/SCOOP powder Take 17 g by mouth 2 (two) times daily as needed for moderate constipation.  [name] Kitchen REFRESH 1.4-0.6 % SOLN Place 1 drop into both eyes as needed.   No facility-administered encounter medications on file as of 09/14/2020.   Recent Relevant Labs: Lab Results  Component Value Date/Time   HGBA1C 6.4 (A) 07/13/2020 09:51 AM   HGBA1C 7.0 (H) 03/31/2020 11:45 AM   HGBA1C 7.1 (H) 11/25/2019 10:46 AM    Kidney Function Lab Results  Component Value Date/Time   CREATININE 0.99 11/25/2019 10:46 AM   CREATININE 1.02 (H) 06/13/2019  09:53 AM   GFRNONAA 55 (L) 11/25/2019 10:46 AM   GFRAA 63 11/25/2019 10:46 AM    . Current antihyperglycemic regimen:  o None noted  . What recent interventions/DTPs have been made to improve glycemic control:  o None noted  . Have there been any recent hospitalizations or ED visits since last visit with CPP? No   . Patient denies hypoglycemic symptoms, including Pale, Sweaty, Shaky, Hungry, Nervous/irritable and Vision changes   . Patient denies hyperglycemic symptoms, including blurry vision, excessive thirst, fatigue, polyuria and weakness   . How often are you checking your blood sugar? twice daily   . What are your blood sugars ranging?  o Fasting: 119 09/13/20 o Before meals:  o After meals: 151 09/13/20 o Bedtime:  . During the week, how often does your blood glucose drop below 70? Never   . Are you checking your feet daily/regularly?   Patient states she checks her feet every day.  Adherence Review: Is the patient currently on a STATIN medication? No Is the patient currently on ACE/ARB medication? Yes Does the patient have >5 day gap between last estimated fill dates? No    Star Rating Drugs: Irbesartan 300 mg Last filled:08/18/2020 90 DS  [facility] Clinical Pharmacist Assistant [phone]

## 2020-09-15 ENCOUNTER — Encounter: Payer: Medicare HMO | Admitting: Physical Therapy

## 2020-09-17 ENCOUNTER — Encounter: Payer: Medicare HMO | Admitting: Physical Therapy

## 2020-09-24 ENCOUNTER — Encounter: Payer: Self-pay | Admitting: Internal Medicine

## 2020-09-24 ENCOUNTER — Other Ambulatory Visit: Payer: Self-pay

## 2020-09-24 ENCOUNTER — Ambulatory Visit (INDEPENDENT_AMBULATORY_CARE_PROVIDER_SITE_OTHER): Payer: Medicare HMO | Admitting: Internal Medicine

## 2020-09-24 VITALS — BP 124/60 | HR 76 | Temp 98.4°F | Ht 67.0 in | Wt 202.0 lb

## 2020-09-24 DIAGNOSIS — M25511 Pain in right shoulder: Secondary | ICD-10-CM | POA: Insufficient documentation

## 2020-09-24 DIAGNOSIS — F419 Anxiety disorder, unspecified: Secondary | ICD-10-CM

## 2020-09-24 DIAGNOSIS — R69 Illness, unspecified: Secondary | ICD-10-CM | POA: Diagnosis not present

## 2020-09-24 NOTE — Progress Notes (Signed)
Date:  09/24/2020   Name:  Molly Ruiz   DOB:  February 17, 1941   MRN:  [id number]   Chief Complaint: Mental Health Problem (X5 days, Pt can only explain her problem as "it feels like Im going crazy in my head", cant function clearly, feels light headed, pt stated that when she use to feel like this a while back she would take Wellbutrin and feel ok afterwards)  Anxiety Presents for follow-up visit. Symptoms include nervous/anxious behavior and restlessness. Patient reports no chest pain, confusion, depressed mood, dizziness, palpitations, shortness of breath or suicidal ideas. Symptoms occur most days. The quality of sleep is good. Nighttime awakenings: occasional.   Compliance with medications is 76-100% (bupropion xl 150 mg daily).  Shoulder Pain  The pain is present in the right shoulder. This is a new problem. The current episode started yesterday. The problem has been gradually improving. The quality of the pain is described as sharp. The pain is moderate. Associated symptoms include a limited range of motion. Pertinent negatives include no fever or numbness. Associated symptoms comments: Noted a swelling like a dislocation. Exacerbated by: occured last night when reaching up into the microwave.    Lab Results  Component Value Date   CREATININE 0.99 11/25/2019   BUN 12 11/25/2019   NA 140 11/25/2019   K 4.6 11/25/2019   CL 100 11/25/2019   CO2 26 11/25/2019   Lab Results  Component Value Date   CHOL 271 (H) 03/31/2020   HDL 61 03/31/2020   LDLCALC 164 (H) 03/31/2020   TRIG 247 (H) 03/31/2020   CHOLHDL 4.4 03/31/2020   Lab Results  Component Value Date   TSH 1.730 11/25/2019   Lab Results  Component Value Date   HGBA1C 6.4 (A) 07/13/2020   Lab Results  Component Value Date   WBC 5.4 06/13/2019   HGB 12.3 06/13/2019   HCT 36.6 06/13/2019   MCV 88 06/13/2019   PLT 201 06/13/2019   Lab Results  Component Value Date   ALT 15 11/25/2019   AST 24 11/25/2019    ALKPHOS 108 11/25/2019   BILITOT 0.3 11/25/2019     Review of Systems  Constitutional: Negative for chills, fatigue and fever.  HENT: Negative for congestion, sinus pressure and trouble swallowing.   Respiratory: Negative for cough, chest tightness and shortness of breath.   Cardiovascular: Negative for chest pain and palpitations.  Musculoskeletal: Positive for arthralgias (right shoulder pain and swelling).  Neurological: Negative for dizziness, weakness, light-headedness, numbness and headaches.  Psychiatric/Behavioral: Negative for confusion, dysphoric mood and suicidal ideas. The patient is nervous/anxious.     Patient Active Problem List   Diagnosis Date Noted  . Myalgia due to statin 03/31/2020  . Gastroesophageal reflux disease 09/19/2019  . Special screening for malignant neoplasms, colon   . Tubular adenoma of colon   . Anxiety disorder 06/13/2019  . Osteopenia determined by x-ray 06/06/2018  . Essential hypertension 04/26/2018  . Eczema 10/25/2017  . 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 Primary osteoarthritis of left hip 01/12/2017  . OSA on CPAP 01/02/2017  . Type II diabetes mellitus with complication ([place]) 40/98/1191  . 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 Hyperlipidemia associated with type 2 diabetes mellitus ([place]) 06/24/2016  . Obesity (BMI 30.0-34.9) 06/24/2016  . Vitamin D deficiency 06/24/2016  . History of shingles 06/24/2016  . FH: heart disease 06/24/2016  . Allergic rhinitis 06/24/2016    Allergies  Allergen Reactions  . Atorvastatin  Other (See Comments)    Peripheral neuropathy and myalgia  . Influenza Vaccines Rash    Past Surgical History:  Procedure Laterality Date  . ABDOMINAL HYSTERECTOMY    . BILATERAL CARPAL TUNNEL RELEASE    . CATARACT EXTRACTION W/PHACO Right 11/12/2018   Procedure: CATARACT EXTRACTION PHACO AND INTRAOCULAR LENS PLACEMENT ([place])  RIGHT DIABETIC;  Surgeon: Eulogio Bear, MD;  Location:  [place];  Service: Ophthalmology;  Laterality: Right;  Diabetic - diet controlled sleep apnea  . CERVICAL FUSION  2004  . COLONOSCOPY WITH PROPOFOL N/A 07/22/2019   Procedure: COLONOSCOPY WITH BIOPSIES;  Surgeon: Lucilla Lame, MD;  Location: [place];  Service: Endoscopy;  Laterality: N/A;  Diabetic - diet controlled priority 4  . FOOT SURGERY    . [place] SURGERY  1998  . POLYPECTOMY N/A 07/22/2019   Procedure: POLYPECTOMY;  Surgeon: Lucilla Lame, MD;  Location: [place];  Service: Endoscopy;  Laterality: N/A;    Social History   Tobacco Use  . Smoking status: Never Smoker  . Smokeless tobacco: Never Used  Vaping Use  . Vaping Use: Never used  Substance Use Topics  . Alcohol use: No  . Drug use: No     Medication list has been reviewed and updated.  Current Meds  Medication Sig  . acetaminophen (TYLENOL) 500 MG tablet Take 2 tablets (1,000 mg total) by mouth 2 (two) times daily.  [name] Kitchen amLODipine (NORVASC) 5 MG tablet Take 1 tablet by mouth once daily  . buPROPion (WELLBUTRIN XL) 150 MG 24 hr tablet Take 1 tablet by mouth once daily  . CALCIUM CITRATE PO Take 600 mg by mouth daily.  . cholecalciferol (VITAMIN D3) 25 MCG (1000 UNIT) tablet Take 1,000 Units by mouth daily.  [name] Kitchen ezetimibe (ZETIA) 10 MG tablet Take 1 tablet (10 mg total) by mouth daily.  [name] Kitchen glucose blood (ONETOUCH ULTRA) test strip Test Blood Sugar twice daily.  . irbesartan (AVAPRO) 300 MG tablet Take 1 tablet by mouth once daily  . Lancets 28G MISC 1 each by Does not apply route daily.  . meloxicam (MOBIC) 15 MG tablet Take 1 tablet (15 mg total) by mouth daily. (Patient taking differently: Take 15 mg by mouth as needed.)  . Multiple Vitamins-Minerals (MULTIVITAMIN ADULTS 50+ PO) Take 1 tablet by mouth daily.  . NON FORMULARY CPAP @@ 12 cm H20  . Omega-3 Fatty Acids (FISH OIL) 1000 MG CAPS Take 1 capsule by mouth daily.  . polyethylene glycol powder (GLYCOLAX/MIRALAX) 17 GM/SCOOP  powder Take 17 g by mouth 2 (two) times daily as needed for moderate constipation.  [name] Kitchen REFRESH 1.4-0.6 % SOLN Place 1 drop into both eyes as needed.  . triamcinolone (KENALOG) 0.025 % cream Apply topically.    PHQ 2/9 Scores 09/24/2020 08/11/2020 07/23/2020 07/01/2020  PHQ - 2 Score 0 0 0 0  PHQ- 9 Score 1 0 0 0    GAD 7 : Generalized Anxiety Score 09/24/2020 08/11/2020 07/23/2020 07/01/2020  Nervous, Anxious, on Edge 0 0 0 0  Control/stop worrying 1 0 0 0  Worry too much - different things 1 0 0 0  Trouble relaxing 0 0 0 0  Restless 0 0 0 0  Easily annoyed or irritable 0 0 0 0  Afraid - awful might happen 0 0 0 0  Total GAD 7 Score 2 0 0 0  Anxiety Difficulty - Not difficult at all Not difficult at all -    BP Readings from Last 3  Encounters:  09/24/20 124/60  08/11/20 138/68  07/23/20 124/68    Physical Exam Vitals and nursing note reviewed.  Constitutional:      General: She is not in acute distress.    Appearance: Normal appearance. She is well-developed.  HENT:     Head: Normocephalic and atraumatic.  Cardiovascular:     Rate and Rhythm: Normal rate and regular rhythm.     Pulses: Normal pulses.     Heart sounds: No murmur heard.   Pulmonary:     Effort: Pulmonary effort is normal. No respiratory distress.     Breath sounds: No wheezing or rhonchi.  Musculoskeletal:     Right shoulder: Swelling (cystic swelling anterior to the [facility] joint ) present. No effusion, bony tenderness or crepitus. Decreased range of motion.     Cervical back: Normal range of motion.     Right lower leg: No edema.     Left lower leg: No edema.  Lymphadenopathy:     Cervical: No cervical adenopathy.  Skin:    General: Skin is warm and dry.     Findings: No rash.  Neurological:     Mental Status: She is alert and oriented to person, place, and time.  Psychiatric:        Attention and Perception: Attention normal.        Mood and Affect: Mood normal. Mood is not depressed. Affect is not tearful  or inappropriate.        Speech: Speech normal.        Behavior: Behavior normal.        Thought Content: Thought content does not include suicidal plan.        Cognition and Memory: Cognition normal.     Wt Readings from Last 3 Encounters:  09/24/20 202 lb (91.6 kg)  08/11/20 211 lb (95.7 kg)  07/23/20 212 lb (96.2 kg)    BP 124/60   Pulse 76   Temp 98.4 F (36.9 C) (Oral)   Ht 5\' 7"  (1.702 m)   Wt 202 lb (91.6 kg)   SpO2 98%   BMI 31.64 kg/m   Assessment and Plan: 1. Anxiety disorder, unspecified type Recommend increasing bupropion to 300 mg daily Will follow up at appt next month  2. Acute pain of right shoulder Suspect soft tissue strain which may be related to the soft tissue mass.  If sx progress, will consider imaging and/or referral to ortho or SM   Partially dictated using Dragon software. Any errors are unintentional.  [name], MD [facility]  09/24/2020

## 2020-09-24 NOTE — Patient Instructions (Signed)
Take 2 Bupropion daily (to equal 300 mg)

## 2020-09-28 DIAGNOSIS — G4733 Obstructive sleep apnea (adult) (pediatric): Secondary | ICD-10-CM | POA: Diagnosis not present

## 2020-10-07 DIAGNOSIS — M9903 Segmental and somatic dysfunction of lumbar region: Secondary | ICD-10-CM | POA: Diagnosis not present

## 2020-10-07 DIAGNOSIS — M9901 Segmental and somatic dysfunction of cervical region: Secondary | ICD-10-CM | POA: Diagnosis not present

## 2020-10-07 DIAGNOSIS — M4306 Spondylolysis, lumbar region: Secondary | ICD-10-CM | POA: Diagnosis not present

## 2020-10-07 DIAGNOSIS — M40292 Other kyphosis, cervical region: Secondary | ICD-10-CM | POA: Diagnosis not present

## 2020-10-09 DIAGNOSIS — M9903 Segmental and somatic dysfunction of lumbar region: Secondary | ICD-10-CM | POA: Diagnosis not present

## 2020-10-09 DIAGNOSIS — M4306 Spondylolysis, lumbar region: Secondary | ICD-10-CM | POA: Diagnosis not present

## 2020-10-09 DIAGNOSIS — M9901 Segmental and somatic dysfunction of cervical region: Secondary | ICD-10-CM | POA: Diagnosis not present

## 2020-10-09 DIAGNOSIS — M40292 Other kyphosis, cervical region: Secondary | ICD-10-CM | POA: Diagnosis not present

## 2020-10-14 ENCOUNTER — Other Ambulatory Visit: Payer: Self-pay | Admitting: Internal Medicine

## 2020-10-14 DIAGNOSIS — E1169 Type 2 diabetes mellitus with other specified complication: Secondary | ICD-10-CM

## 2020-10-14 NOTE — Telephone Encounter (Signed)
Requested Prescriptions  Pending Prescriptions Disp Refills  . ezetimibe (ZETIA) 10 MG tablet [Pharmacy Med Name: Ezetimibe 10 MG Oral Tablet] 90 tablet 0    Sig: Take 1 tablet by mouth once daily     Cardiovascular:  Antilipid - Sterol Transport Inhibitors Failed - 10/14/2020  5:30 AM      Failed - Total Cholesterol in normal range and within 360 days    Cholesterol, Total  Date Value Ref Range Status  03/31/2020 271 (H) 100 - 199 mg/dL Final         Failed - LDL in normal range and within 360 days    LDL Chol Calc (NIH)  Date Value Ref Range Status  03/31/2020 164 (H) 0 - 99 mg/dL Final         Failed - Triglycerides in normal range and within 360 days    Triglycerides  Date Value Ref Range Status  03/31/2020 247 (H) 0 - 149 mg/dL Final         Passed - HDL in normal range and within 360 days    HDL  Date Value Ref Range Status  03/31/2020 61 >39 mg/dL Final         Passed - Valid encounter within last 12 months    Recent Outpatient Visits          2 weeks ago Anxiety disorder, unspecified type   [facility] Glean Hess, MD   2 months ago Primary osteoarthritis of left hip   [facility] Montel Culver, MD   2 months ago Unilateral primary osteoarthritis, left hip   [facility] Montel Culver, MD   3 months ago Type II diabetes mellitus with complication [facility])   [facility] Glean Hess, MD   3 months ago Primary osteoarthritis of left hip   [facility] Glean Hess, MD      Future Appointments            In 1 week Army Melia Jesse Sans, MD [facility], [facility]

## 2020-10-18 ENCOUNTER — Other Ambulatory Visit: Payer: Self-pay | Admitting: Internal Medicine

## 2020-10-18 NOTE — Telephone Encounter (Signed)
Requested Prescriptions  Pending Prescriptions Disp Refills  . amLODipine (NORVASC) 5 MG tablet [Pharmacy Med Name: amLODIPine Besylate 5 MG Oral Tablet] 90 tablet 0    Sig: Take 1 tablet by mouth once daily     Cardiovascular:  Calcium Channel Blockers Passed - 10/18/2020 12:23 PM      Passed - Last BP in normal range    BP Readings from Last 1 Encounters:  09/24/20 124/60         Passed - Valid encounter within last 6 months    Recent Outpatient Visits          3 weeks ago Anxiety disorder, unspecified type   [facility] Glean Hess, MD   2 months ago Primary osteoarthritis of left hip   [facility] Montel Culver, MD   2 months ago Unilateral primary osteoarthritis, left hip   [facility] Montel Culver, MD   3 months ago Type II diabetes mellitus with complication [facility])   [facility] Glean Hess, MD   3 months ago Primary osteoarthritis of left hip   [facility] Glean Hess, MD      Future Appointments            In 5 days Glean Hess, MD [facility], [facility]

## 2020-10-20 ENCOUNTER — Ambulatory Visit (INDEPENDENT_AMBULATORY_CARE_PROVIDER_SITE_OTHER): Payer: Medicare HMO | Admitting: Pharmacist

## 2020-10-20 DIAGNOSIS — I10 Essential (primary) hypertension: Secondary | ICD-10-CM

## 2020-10-20 NOTE — Patient Instructions (Signed)
Visit Information  It was a pleasure speaking with you today. Thank you for letting me be part of your clinical team. Please call with any questions or concerns.    Goals Addressed             This Visit's Progress    Monitor and Manage My Blood Sugar-Diabetes Type 2   On track    Timeframe:  Long-Range Goal Priority:  High Start Date:                             Expected End Date:                       Follow Up Date 2 month follow up   - check blood sugar before and after exercise - check blood sugar if I feel it is too high or too low - take the blood sugar meter to all doctor visits    Why is this important?   Checking your blood sugar at home helps to keep it from getting very high or very low.  Writing the results in a diary or log helps the doctor know how to care for you.  Your blood sugar log should have the time, date and the results.  Also, write down the amount of insulin or other medicine that you take.  Other information, like what you ate, exercise done and how you were feeling, will also be helpful.     Notes:       COMPLETED: Pharmacy Care Plan       CARE PLAN ENTRY (see longitudinal plan of care for additional care plan information)  Current Barriers:  Chronic Disease Management support, education, and care coordination needs related to Hypertension, Hyperlipidemia, Diabetes, Anxiety, and Osteopenia   Hypertension BP Readings from Last 3 Encounters:  08/11/20 138/68  07/23/20 124/68  07/13/20 140/66  Pharmacist Clinical Goal(s): Over the next 90 days, patient will work with PharmD and providers to maintain BP goal <130/80 Current regimen:  Amlodipine 5 mg qd Irbesartan 300 mg qd Interventions: Comprehensive medication review performed, medication list updated in electronic medical record  Patient self care activities - Over the next 90 days, patient will: Check BP daily, document, and provide at future appointments Ensure daily salt intake <  2300 mg/ Continue exercising 30 min/day   Hyperlipidemia Lab Results  Component Value Date/Time   LDLCALC 164 (H) 03/31/2020 11:45 AM  Pharmacist Clinical Goal(s): Over the next 90 days, patient will work with PharmD and providers to achieve LDL goal < 70  Current regimen:  Fish Oil 1000 mg daily  Interventions: Discussed options other than statin to help manage cholesterol. Discuss with PCP next visit. Patient self care activities - Over the next 90 days, patient will: Continue exercise regimen and diet adherence     Diabetes Lab Results  Component Value Date/Time   HGBA1C 6.4 (A) 07/13/2020 09:51 AM   HGBA1C 7.0 (H) 03/31/2020 11:45 AM   HGBA1C 7.1 (H) 11/25/2019 10:46 AM  Pharmacist Clinical Goal(s): Over the next 90 days, patient will work with PharmD and providers to achieve A1c goal <7% Current regimen:  Diet and exercise Interventions: Discussed A1c and fasting blood glucose levels.  Discussed adverse effects of elevated glucose levels and recommend patient consider medication at next appointment. .Comprehensive medication review performed, medication list updated in electronic medical record Patient self care activities - Over the next 90 days, patient  will: Check blood sugar once daily, document, and provide at future appointments Contact provider with any episodes of hypoglycemia  Medication management Pharmacist Clinical Goal(s): Over the next 90 days, patient will work with PharmD and providers to achieve optimal medication adherence Current pharmacy: [facility] Interventions Comprehensive medication review performed. Continue current medication management strategy Patient self care activities - Over the next  90 days, patient will: Focus on medication adherence by fill dates Take medications as prescribed Report any questions or concerns to PharmD and/or provider(s)  Initial goal documentation       COMPLETED: Set My Target A1C-Diabetes Type 2        Timeframe:  Long-Range Goal Priority:  High Start Date:                             Expected End Date:                       Follow Up Date 2 month follow up    - set target A1C    Why is this important?   Your target A1C is decided together by you and your doctor.  It is based on several things like your age and other health issues.    Notes:       Track and Manage My Blood Pressure-Hypertension   On track    Timeframe:  Long-Range Goal Priority:  High Start Date:                             Expected End Date:                       Follow Up Date 3 month follow up    - check blood pressure daily - write blood pressure results in a log or diary    Why is this important?   You won't feel high blood pressure, but it can still hurt your blood vessels.  High blood pressure can cause heart or kidney problems. It can also cause a stroke.  Making lifestyle changes like losing a little weight or eating less salt will help.  Checking your blood pressure at home and at different times of the day can help to control blood pressure.  If the doctor prescribes medicine remember to take it the way the doctor ordered.  Call the office if you cannot afford the medicine or if there are questions about it.     Notes:          The patient verbalized understanding of instructions, educational materials, and care plan provided today and agreed to receive a mailed copy of patient instructions, educational materials, and care plan.   Telephone follow up appointment with pharmacy team member scheduled for: 1 month CPA  Junita Push. Kenton Kingfisher PharmD, [place] Clinical Pharmacist [phone]

## 2020-10-20 NOTE — Progress Notes (Signed)
Chronic Care Management Pharmacy Note  10/20/2020 Name:  Molly Ruiz MRN:  [id number] DOB:  Oct 16, 1940  Subjective: Molly Ruiz is an 80 y.o. year old female who is a primary patient of Army Melia, Jesse Sans, MD.  The CCM team was consulted for assistance with disease management and care coordination needs.    Engaged with patient by telephone for follow up visit in response to provider referral for pharmacy case management and/or care coordination services.   Consent to Services:  The patient was given information about Chronic Care Management services, agreed to services, and gave verbal consent prior to initiation of services.  Please see initial visit note for detailed documentation.   Patient Care Team: Glean Hess, MD as PCP - General (Internal Medicine) [facility] (Ophthalmology) Vladimir Faster, [facility] (Pharmacist)  Recent office visits: 09/24/20- Berglund(PCP)- shoulder pain, anxiety? 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 continue Mobic 07/13/20- berglund- A1c 6.4 07/01/20- Berglund- meloxicam 15 mg , l back/hip pain  03/31/20-Berglund- Zetia 51m , DM eye exam, blood work   Recent consult visits: 06/08/20- DEXA 04/17/21- Breast [place] CAD/tomo L breast- no amlignancy,  04/01/20- mammogram--needs diagnostic  Hospital visits: None in previous 6 months  Objective:  Lab Results  Component Value Date   CREATININE 0.99 11/25/2019   BUN 12 11/25/2019   GFRNONAA 55 (L) 11/25/2019   GFRAA 63 11/25/2019   NA 140 11/25/2019   K 4.6 11/25/2019   CALCIUM 9.7 11/25/2019   CO2 26 11/25/2019   GLUCOSE 138 (H) 11/25/2019    Lab Results  Component Value Date/Time   HGBA1C 6.4 (A) 07/13/2020 09:51 AM   HGBA1C 7.0 (H) 03/31/2020 11:45 AM   HGBA1C 7.1 (H) 11/25/2019 10:46 AM    Last diabetic Eye exam:  Lab Results  Component Value  Date/Time   HMDIABEYEEXA No Retinopathy 10/25/2019 12:00 AM    Last diabetic Foot exam: No results found for: HMDIABFOOTEX   Lab Results  Component Value Date   CHOL 271 (H) 03/31/2020   HDL 61 03/31/2020   LDLCALC 164 (H) 03/31/2020   TRIG 247 (H) 03/31/2020   CHOLHDL 4.4 03/31/2020    Hepatic Function Latest Ref Rng & Units 11/25/2019 06/13/2019 04/26/2018  Total Protein 6.0 - 8.5 g/dL 7.5 7.5 7.7  Albumin 3.7 - 4.7 g/dL 4.2 4.0 4.3  AST 0 - 40 IU/L 24 21 16   ALT 0 - 32 IU/L 15 13 13   Alk Phosphatase 48 - 121 IU/L 108 109 116  Total Bilirubin 0.0 - 1.2 mg/dL 0.3 0.4 0.3    Lab Results  Component Value Date/Time   TSH 1.730 11/25/2019 10:46 AM   TSH 2.390 06/13/2019 09:53 AM    CBC Latest Ref Rng & Units 06/13/2019 04/26/2018 05/19/2017  WBC 3.4 - 10.8 x10E3/uL 5.4 4.6 4.2  Hemoglobin 11.1 - 15.9 g/dL 12.3 13.0 12.9  Hematocrit 34.0 - 46.6 % 36.6 37.9 38.2  Platelets 150 - 450 x10E3/uL 201 193 215    Lab Results  Component Value Date/Time   VD25OH 25.8 (L) 08/17/2017 11:09 AM   VD25OH 19.2 (L) 05/19/2017 11:48 AM    Clinical ASCVD: No  The 10-year ASCVD risk score (Mikey BussingDC Jr., et al., 2013) is: 48.6%   Values used to calculate the score:     Age: [age]years     Sex: Female     Is Non-Hispanic African  American: Yes     Diabetic: Yes     Tobacco smoker: No     Systolic Blood Pressure: 606 mmHg     Is BP treated: Yes     HDL Cholesterol: 61 mg/dL     Total Cholesterol: 271 mg/dL    Depression screen [facility] 2/9 09/24/2020 08/11/2020 07/23/2020  Decreased Interest 0 0 0  Down, Depressed, Hopeless 0 0 0  PHQ - 2 Score 0 0 0  Altered sleeping 0 0 0  Tired, decreased energy 0 0 0  Change in appetite 0 0 0  Feeling bad or failure about yourself  0 0 0  Trouble concentrating 1 0 0  Moving slowly or fidgety/restless 0 0 0  Suicidal thoughts 0 0 0  PHQ-9 Score 1 0 0  Difficult doing work/chores - Not difficult at all Not difficult at all  Some recent data might be hidden       Social History   Tobacco Use  Smoking Status Never  Smokeless Tobacco Never   BP Readings from Last 3 Encounters:  09/24/20 124/60  08/11/20 138/68  07/23/20 124/68   Pulse Readings from Last 3 Encounters:  09/24/20 76  08/11/20 83  07/23/20 69   Wt Readings from Last 3 Encounters:  09/24/20 202 lb (91.6 kg)  08/11/20 211 lb (95.7 kg)  07/23/20 212 lb (96.2 kg)   BMI Readings from Last 3 Encounters:  09/24/20 31.64 kg/m  08/11/20 33.05 kg/m  07/23/20 33.20 kg/m    Assessment/Interventions: Review of patient past medical history, allergies, medications, health status, including review of consultants reports, laboratory and other test data, was performed as part of comprehensive evaluation and provision of chronic care management services.   SDOH:  (Social Determinants of Health) assessments and interventions performed: No  SDOH Screenings   Alcohol Screen: Not on file  Depression (PHQ2-9): Low Risk    PHQ-2 Score: 1  Financial Resource Strain: Low Risk    Difficulty of Paying Living Expenses: Not very hard  Food Insecurity: No Food Insecurity   Worried About Charity fundraiser in the Last Year: Never true   Ran Out of Food in the Last Year: Never true  Housing: Low Risk    Last Housing Risk Score: 0  Physical Activity: Insufficiently Active   Days of Exercise per Week: 3 days   Minutes of Exercise per Session: 30 min  Social Connections: Moderately Integrated   Frequency of Communication with Friends and Family: More than three times a week   Frequency of Social Gatherings with Friends and Family: Three times a week   Attends Religious Services: More than 4 times per year   Active Member of Clubs or Organizations: Yes   Attends Archivist Meetings: More than 4 times per year   Marital Status: Widowed  Stress: No Stress Concern Present   Feeling of Stress : Only a little  Tobacco Use: Low Risk    Smoking Tobacco Use: Never   Smokeless Tobacco  Use: Never  Transportation Needs: No Transportation Needs   Lack of Transportation (Medical): No   Lack of Transportation (Non-Medical): No      Immunization History  Administered Date(s) Administered   PFIZER(Purple Top)SARS-COV-2 Vaccination 07/04/2019, 07/30/2019, 03/21/2020   Pneumococcal Conjugate-13 06/24/2016   Pneumococcal Polysaccharide-23 08/23/2017   Tdap 06/24/2016    Conditions to be addressed/monitored:  Hypertension, Hyperlipidemia, Diabetes, GERD, Anxiety, Osteopenia and Osteoarthritis  Care Plan : [place]  Updates made by Kenton Kingfisher,  Junita Push, RPH since 10/20/2020 12:00 AM     Problem: DM, HTN, HLD, osteoarthritis, osteopenia, ? CKD   Priority: High     Long-Range Goal: Disease Management   Start Date: 08/25/2020  Recent Progress: On track  Priority: High  Note:   Current Barriers:  Unable to independently monitor therapeutic efficacy Unable to achieve control of hyperlipidemia  Suboptimal therapeutic regimen for diabetes Does not contact provider office for questions/concerns   Pharmacist Clinical Goal(s):  Patient will achieve adherence to monitoring guidelines and medication adherence to achieve therapeutic efficacy achieve control of Hyperlipidemia and diabetes as evidenced by lab values maintain control of blood pressure as evidenced by readings  contact provider office for questions/concerns as evidenced notation of same in electronic health record through collaboration with PharmD and provider.    Interventions: 1:1 collaboration with Glean Hess, MD regarding development and update of comprehensive plan of care as evidenced by provider attestation and co-signature Inter-disciplinary care team collaboration (see longitudinal plan of care) Comprehensive medication review performed; medication list updated in electronic medical record  Osteoporosis / Osteopenia (Goal prevent fracture) -Not ideally controlled -Last DEXA Scan:  06/08/20   T-Score total hip: 1.6   T-Score forearm radius: -1.7  10-year probability of major osteoporotic fracture: 1.4%  10-year probability of hip fracture: 0% -Patient is not a candidate for pharmacologic treatment -Current treatment  Calcium 1200 mg daily Vitamin d 1000 units daily -Medications previously tried: NA  -Recommend [phone] units of vitamin D daily. Recommend 1200 mg of calcium daily from dietary and supplemental sources. Recommend weight-bearing and muscle strengthening exercises for building and maintaining bone density. -Counseled on diet and exercise extensively Recommended to change to calcium citrate 500-600 mg bid for better absorption Recommended follow up  Vitamin D level with next labs   The 10-year ASCVD risk score Mikey Bussing DC Jr., et al., 2013) is: 53% Lipid Panel     Component Value Date/Time   CHOL 271 (H) 03/31/2020 1145   TRIG 247 (H) 03/31/2020 1145   HDL 61 03/31/2020 1145   CHOLHDL 4.4 03/31/2020 1145   LDLCALC 164 (H) 03/31/2020 1145   LABVLDL 46 (H) 03/31/2020 1145    Hyperlipidemia: (LDL goal < 70) -Uncontrolled -Current treatment: Zetia 10 mg qd Fish oil 1000 mg qd -Medications previously tried: multiople statins- myalgias -Current dietary patterns: eats helathy has lost 9 pounds -Current exercise habits: Will resume 20 min/day exercise bike and 10 min/day treadmill Update 10/20/20- she walks 10 laps in her apartment three times daily, considering going back to silver sneakers or water aerobics -Educated on Cholesterol goals;  Importance of limiting foods high in cholesterol; Exercise goal of 150 minutes per week; PSCK9 inhibitors for cholesterol management. -Counseled on diet and exercise extensively Recommended to continue current medication  Counseled on PCSK-9 inhibitor if lipids still not at goal to decrease ASCVD risk. Recommended start [place] if lipids elevated at next check Assessed patient finances. Can pursue [employer] grant  to [place] co-pay. Recommended vitamin d level with next labs. Once at goal consider trial of rosuvastatin three times weekly  Hypertension (BP goal <140/90) -Not ideally controlled -Current treatment: Amlodipine 5 mg qd Irbesartan 300 mg qd -Medications previously tried: na  -Current home readings: 148/68, 145/ 68-70, 138/68, 134/ 69 Update: 10/20/20 128/70, 133/66 -Denies hypotensive/hypertensive symptoms -Educated on BP goals and benefits of medications for prevention of heart attack, stroke and kidney damage; Daily salt intake goal < 2300 mg; Exercise goal of 150 minutes per week; Importance  of home blood pressure monitoring; Recent steroid injection and stress over elvated BG could impact BP readings -Counseled to monitor BP at home daily, document, and provide log at future appointments -Recommended to continue current medication    Lab Results  Component Value Date   CREATININE 0.99 11/25/2019   Diabetes / CKD(A1c goal <7%) -Controlled -Current medications: Diet and exercise -Medications previously tried: none  -Current home glucose readings fasting glucose: 148 today update 10/20/20 < 130 -Reports hypoglycemic/hyperglycemic symptoms. Reports BG reading >300 after steroid hip injection. Patient is very concerned about maintaining goal BG --Educated on A1c and blood sugar goals; Complications of diabetes including kidney damage, retinal damage, and cardiovascular disease; Exercise goal of 150 minutes per week; Benefits of routine self-monitoring of blood sugar; -Counseled to check feet daily and get yearly eye exams -Recommended consider starting Farxiga for renal protection and diabetes.  Assessed patient finances. Can utilize fre month voucher and pursue PAP.   Depression/Anxiety (Goal: remission of symptoms) -Not ideally controlled -Current treatment: Buproprion XL 300 mg daily (150 mg x2) -Medications previously tried/failed: NA -PHQ9:.1 -GAD7:2 -  -Educated  on Benefits of cognitive-behavioral therapy with or without medication -Counseled on diet and exercise extensively Recommended changing to an SSRI like escitalopram if feeling "nervous" on Bupropion Educated on stimulating effects of bupropion and maximum recommended dose of 150 mg/ daily with  renal impairment (crcl < 60 ml/min)    Osteoarthritis bilat hip (Goal: reduce symptoms) -Not ideally controlled -Current treatment  Acetaminophen prn -Medications previously tried: meloxicam, intraarticular injection  -Counseled on side effects on long term NSAID use  - patient has not been to PT this week due to financial concerns of copay.  She plans to start next week. -Update 10/20/20: Patient reports alleviation of pain since injection. She is not currently using pain medication   Patient Goals/Self-Care Activities Patient will:  - take medications as prescribed check glucose daily  and when feeling symptomatic, document, and provide at future appointments check blood pressure dailyu, document, and provide at future appointments collaborate with provider on medication access solutions target a minimum of 150 minutes of moderate intensity exercise weekly  Follow Up Plan: Telephone follow up appointment with care management team member scheduled for: 1 month CPA follow up GAD-7, 3 months PharmD          Medication Assistance: None required.  Patient affirms current coverage meets needs.  Patient's preferred pharmacy is:  [facility] [address], [place] - [place] [address] Phone: [phone] Fax: [phone]  Uses pill box? Yes Pt endorses 90% compliance  We discussed: Benefits of medication synchronization, packaging and delivery as well as enhanced pharmacist oversight with [employer]. Patient decided to: Continue current medication management strategy  Care Plan and Follow Up Patient Decision:  Patient agrees to Care Plan and  Follow-up.  Plan: Telephone follow up appointment with care management team member scheduled for:  2 months Pharm D, 1 month CPA  Junita Push. Kenton Kingfisher PharmD, [facility] [phone]

## 2020-10-23 ENCOUNTER — Ambulatory Visit (INDEPENDENT_AMBULATORY_CARE_PROVIDER_SITE_OTHER): Payer: Medicare HMO | Admitting: Internal Medicine

## 2020-10-23 ENCOUNTER — Other Ambulatory Visit: Payer: Self-pay

## 2020-10-23 ENCOUNTER — Telehealth: Payer: Self-pay

## 2020-10-23 ENCOUNTER — Encounter: Payer: Self-pay | Admitting: Internal Medicine

## 2020-10-23 VITALS — BP 128/66 | HR 74 | Temp 98.1°F | Ht 67.0 in | Wt 199.6 lb

## 2020-10-23 DIAGNOSIS — E785 Hyperlipidemia, unspecified: Secondary | ICD-10-CM

## 2020-10-23 DIAGNOSIS — E118 Type 2 diabetes mellitus with unspecified complications: Secondary | ICD-10-CM | POA: Diagnosis not present

## 2020-10-23 DIAGNOSIS — F419 Anxiety disorder, unspecified: Secondary | ICD-10-CM | POA: Diagnosis not present

## 2020-10-23 DIAGNOSIS — Z0000 Encounter for general adult medical examination without abnormal findings: Secondary | ICD-10-CM | POA: Diagnosis not present

## 2020-10-23 DIAGNOSIS — E1169 Type 2 diabetes mellitus with other specified complication: Secondary | ICD-10-CM

## 2020-10-23 DIAGNOSIS — I10 Essential (primary) hypertension: Secondary | ICD-10-CM | POA: Diagnosis not present

## 2020-10-23 DIAGNOSIS — R69 Illness, unspecified: Secondary | ICD-10-CM | POA: Diagnosis not present

## 2020-10-23 LAB — POCT URINALYSIS DIPSTICK
Bilirubin, UA: NEGATIVE
Blood, UA: NEGATIVE
Glucose, UA: NEGATIVE
Ketones, UA: NEGATIVE
Nitrite, UA: NEGATIVE
Protein, UA: NEGATIVE
Spec Grav, UA: 1.01 (ref 1.010–1.025)
Urobilinogen, UA: 0.2 E.U./dL
pH, UA: 6 (ref 5.0–8.0)

## 2020-10-23 MED ORDER — IRBESARTAN 300 MG PO TABS: 300.0000 mg | ORAL_TABLET | Freq: Every day | ORAL | 1 refills | Status: DC

## 2020-10-23 MED ORDER — BUPROPION HCL ER (XL) 300 MG PO TB24: 300.0000 mg | ORAL_TABLET | Freq: Every day | ORAL | 3 refills | Status: DC

## 2020-10-23 MED ORDER — LANCETS 33G MISC: 1.0000 | Freq: Every day | 3 refills | Status: DC

## 2020-10-23 MED ORDER — LANCETS 28G MISC
1.0000 | Freq: Every day | 3 refills | Status: DC
Start: 2020-10-23 — End: 2020-10-23

## 2020-10-23 NOTE — Telephone Encounter (Signed)
Refill was sent and changed to 33 G.  KP

## 2020-10-23 NOTE — Telephone Encounter (Signed)
Copied from [place] [phone]. Topic: General - Other >> Oct 23, 2020  1:06 PM Yvette Rack wrote: Reason for CRM: Neshae with [facility] asked if the Rx for Lancets 28G could be changed to Lancets 33G. Cb# [phone]

## 2020-10-23 NOTE — Progress Notes (Signed)
Date:  10/23/2020   Name:  Molly Ruiz   DOB:  Feb 10, 1941   MRN:  [id number]   Chief Complaint: Annual Exam (Breast Exam. No pap.) Molly Ruiz is a 80 y.o. female who presents today for her Complete Annual Exam. She feels well. She reports exercising - walking 3 times weekly. She reports she is sleeping fairly well. Breast complaints - none.  Mammogram: 04/2020 DEXA: 05/2020 osteopenia wrist/normal hip Pap smear: discontinued Colonoscopy: 07/2019  Immunization History  Administered Date(s) Administered   PFIZER(Purple Top)SARS-COV-2 Vaccination 07/04/2019, 07/30/2019, 03/21/2020   Pneumococcal Conjugate-13 06/24/2016   Pneumococcal Polysaccharide-23 08/23/2017   Tdap 06/24/2016    Hypertension This is a chronic problem. Associated symptoms include anxiety. Pertinent negatives include no chest pain, headaches, palpitations or shortness of breath. Past treatments include calcium channel blockers. The current treatment provides significant improvement.  Diabetes She presents for her follow-up diabetic visit. She has type 2 diabetes mellitus. Her disease course has been stable. Pertinent negatives for hypoglycemia include no dizziness, headaches, nervousness/anxiousness or tremors. Pertinent negatives for diabetes include no chest pain, no fatigue, no polydipsia and no polyuria. Current diabetic treatment includes diet. She is compliant with treatment most of the time. Her weight is stable. Her breakfast blood glucose is taken between 6-7 am. Her breakfast blood glucose range is generally 130-140 mg/dl. Her dinner blood glucose is taken between 5-6 pm. Her dinner blood glucose range is generally 110-130 mg/dl.  Hyperlipidemia This is a chronic problem. The problem is resistant. Pertinent negatives include no chest pain or shortness of breath. Current antihyperlipidemic treatment includes ezetimibe. The current treatment provides mild improvement of lipids.  Anxiety Presents for  follow-up visit. Patient reports no chest pain, dizziness, nervous/anxious behavior, palpitations or shortness of breath. Symptoms occur rarely (feeling much better with higher dose bupropion).   Compliance with medications is 76-100% (increased bupropion last month).   Lab Results  Component Value Date   CREATININE 0.99 11/25/2019   BUN 12 11/25/2019   NA 140 11/25/2019   K 4.6 11/25/2019   CL 100 11/25/2019   CO2 26 11/25/2019   Lab Results  Component Value Date   CHOL 271 (H) 03/31/2020   HDL 61 03/31/2020   LDLCALC 164 (H) 03/31/2020   TRIG 247 (H) 03/31/2020   CHOLHDL 4.4 03/31/2020   Lab Results  Component Value Date   TSH 1.730 11/25/2019   Lab Results  Component Value Date   HGBA1C 6.4 (A) 07/13/2020   Lab Results  Component Value Date   WBC 5.4 06/13/2019   HGB 12.3 06/13/2019   HCT 36.6 06/13/2019   MCV 88 06/13/2019   PLT 201 06/13/2019   Lab Results  Component Value Date   ALT 15 11/25/2019   AST 24 11/25/2019   ALKPHOS 108 11/25/2019   BILITOT 0.3 11/25/2019     Review of Systems  Constitutional:  Negative for chills, fatigue and fever.  HENT:  Negative for congestion, hearing loss, tinnitus, trouble swallowing and voice change.   Eyes:  Negative for visual disturbance.  Respiratory:  Negative for cough, chest tightness, shortness of breath and wheezing.   Cardiovascular:  Negative for chest pain, palpitations and leg swelling.  Gastrointestinal:  Negative for abdominal pain, constipation, diarrhea and vomiting.  Endocrine: Negative for polydipsia and polyuria.  Genitourinary:  Negative for dysuria, frequency, genital sores, vaginal bleeding and vaginal discharge.  Musculoskeletal:  Negative for arthralgias, gait problem and joint swelling.  Skin:  Negative for color change  and rash.  Neurological:  Negative for dizziness, tremors, light-headedness and headaches.  Hematological:  Negative for adenopathy. Does not bruise/bleed easily.   Psychiatric/Behavioral:  Negative for dysphoric mood and sleep disturbance. The patient is not nervous/anxious.    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   Hyperlipidemia associated with type 2 diabetes mellitus ([place]) 06/24/2016   Obesity (BMI 30.0-34.9) 06/24/2016   Vitamin D deficiency 06/24/2016   History of shingles 06/24/2016   Allergic rhinitis 06/24/2016    Allergies  Allergen Reactions   Atorvastatin Other (See Comments)    Peripheral neuropathy and myalgia   Influenza Vaccines Rash    Past Surgical History:  Procedure Laterality Date   ABDOMINAL HYSTERECTOMY     BILATERAL CARPAL TUNNEL RELEASE     CATARACT EXTRACTION W/PHACO Right 11/12/2018   Procedure: CATARACT EXTRACTION PHACO AND INTRAOCULAR LENS PLACEMENT ([place])  RIGHT DIABETIC;  Surgeon: Eulogio Bear, MD;  Location: [facility];  Service: Ophthalmology;  Laterality: Right;  Diabetic - diet controlled sleep apnea   CERVICAL FUSION  2004   COLONOSCOPY WITH PROPOFOL N/A 07/22/2019   Procedure: COLONOSCOPY WITH BIOPSIES;  Surgeon: Lucilla Lame, MD;  Location: [facility];  Service: Endoscopy;  Laterality: N/A;  Diabetic - diet controlled priority [place]   POLYPECTOMY N/A 07/22/2019   Procedure: POLYPECTOMY;  Surgeon: Lucilla Lame, MD;  Location: [facility];  Service:  Endoscopy;  Laterality: N/A;    Social History   Tobacco Use   Smoking status: Never   Smokeless tobacco: Never  Vaping Use   Vaping Use: Never used  Substance Use Topics   Alcohol use: No   Drug use: No     Medication list has been reviewed and updated.  Current Meds  Medication Sig   acetaminophen (TYLENOL) 500 MG tablet Take 2 tablets (1,000 mg total) by mouth 2 (two) times daily.   amLODipine (NORVASC) 5 MG tablet Take 1 tablet by mouth once daily   buPROPion (WELLBUTRIN XL) 150 MG 24 hr tablet Take 1 tablet by mouth once daily (Patient taking differently: 300 mg. Taking 2 tablets)   CALCIUM CITRATE PO Take 1,200 mg by mouth daily.   cholecalciferol (VITAMIN D3) 25 MCG (1000 UNIT) tablet Take 1,000 Units by mouth daily.   ezetimibe (ZETIA) 10 MG tablet Take 1 tablet by mouth once daily   glucose blood (ONETOUCH ULTRA) test strip Test Blood Sugar twice daily.   irbesartan (AVAPRO) 300 MG tablet Take 1 tablet by mouth once daily   Lancets 28G MISC 1 each by Does not apply route daily.   meloxicam (MOBIC) 15 MG tablet Take 1 tablet (15 mg total) by mouth daily.   Multiple Vitamins-Minerals (MULTIVITAMIN ADULTS 50+ PO) Take 1 tablet by mouth daily.   NON FORMULARY CPAP @@ 12 cm H20   Omega-3 Fatty Acids (FISH  OIL) 1000 MG CAPS Take 1 capsule by mouth daily.   polyethylene glycol powder (GLYCOLAX/MIRALAX) 17 GM/SCOOP powder Take 17 g by mouth 2 (two) times daily as needed for moderate constipation.   REFRESH 1.4-0.6 % SOLN Place 1 drop into both eyes as needed.   triamcinolone (KENALOG) 0.025 % cream Apply topically.    PHQ 2/9 Scores 10/23/2020 09/24/2020 08/11/2020 07/23/2020  PHQ - 2 Score 0 0 0 0  PHQ- 9 Score 2 1 0 0    GAD 7 : Generalized Anxiety Score 10/23/2020 09/24/2020 08/11/2020 07/23/2020  Nervous, Anxious, on Edge 1 0 0 0  Control/stop worrying 0 1 0 0  Worry too much - different things 0 1 0 0  Trouble relaxing 0 0 0 0  Restless 0 0 0 0  Easily annoyed or  irritable 0 0 0 0  Afraid - awful might happen 0 0 0 0  Total GAD 7 Score 1 2 0 0  Anxiety Difficulty Not difficult at all - Not difficult at all Not difficult at all    BP Readings from Last 3 Encounters:  10/23/20 128/66  09/24/20 124/60  08/11/20 138/68    Physical Exam Vitals and nursing note reviewed.  Constitutional:      General: She is not in acute distress.    Appearance: She is well-developed.  HENT:     Head: Normocephalic and atraumatic.     Right Ear: Tympanic membrane and ear canal normal.     Left Ear: Tympanic membrane and ear canal normal.     Nose:     Right Sinus: No maxillary sinus tenderness.     Left Sinus: No maxillary sinus tenderness.  Eyes:     General: No scleral icterus.       Right eye: No discharge.        Left eye: No discharge.     Conjunctiva/sclera: Conjunctivae normal.  Neck:     Thyroid: No thyromegaly.     Vascular: No carotid bruit.  Cardiovascular:     Rate and Rhythm: Normal rate and regular rhythm.     Pulses: Normal pulses.     Heart sounds: Normal heart sounds.  Pulmonary:     Effort: Pulmonary effort is normal. No respiratory distress.     Breath sounds: No wheezing.  Chest:  Breasts:    Right: No mass, nipple discharge, skin change or tenderness.     Left: No mass, nipple discharge, skin change or tenderness.  Abdominal:     General: Bowel sounds are normal.     Palpations: Abdomen is soft.     Tenderness: There is no abdominal tenderness.  Musculoskeletal:     Cervical back: Normal range of motion. No erythema.     Right lower leg: No edema.     Left lower leg: No edema.  Lymphadenopathy:     Cervical: No cervical adenopathy.  Skin:    General: Skin is warm and dry.     Findings: No rash.  Neurological:     Mental Status: She is alert and oriented to person, place, and time.     Cranial Nerves: No cranial nerve deficit.     Sensory: No sensory deficit.     Deep Tendon Reflexes: Reflexes are normal and symmetric.   Psychiatric:        Attention and Perception: Attention normal.        Mood and Affect: Mood normal.    Wt Readings from Last 3 Encounters:  10/23/20  199 lb 9.6 oz (90.5 kg)  09/24/20 202 lb (91.6 kg)  08/11/20 211 lb (95.7 kg)    BP 128/66   Pulse 74   Temp 98.1 F (36.7 C) (Oral)   Ht 5\' 7"  (1.702 m)   Wt 199 lb 9.6 oz (90.5 kg)   SpO2 97%   BMI 31.26 kg/m   Assessment and Plan: 1. Annual physical exam Exam is normal except for weight. Encourage regular exercise and appropriate dietary changes. Screenings and immunizations are up to date. Pt plans to [facility] her [facility]  2. Essential hypertension Clinically stable exam with well controlled BP. Tolerating medications without side effects at this time. Pt to continue current regimen and low sodium diet; benefits of regular exercise as able discussed. - CBC with Differential/Platelet - Comprehensive metabolic panel - TSH - POCT urinalysis dipstick - irbesartan (AVAPRO) 300 MG tablet; Take 1 tablet (300 mg total) by mouth daily.  Dispense: 90 tablet; Refill: 1  3. Type II diabetes mellitus with complication (HCC) Clinically stable by exam and report without s/s of hypoglycemia. DM complicated by hypertension and dyslipidemia. Continue diet control - will advise if medication is needed - Hemoglobin A1c - Lancets 28G MISC; 1 each by Does not apply route daily.  Dispense: 100 each; Refill: 3  4. Hyperlipidemia associated with type 2 diabetes mellitus ([place]) On Zetia due to an adverse reaction to Liptor - Lipid panel  5. Anxiety disorder, unspecified type Symptoms are much improved with higher dose bupropion - continue current therapy - buPROPion (WELLBUTRIN XL) 300 MG 24 hr tablet; Take 1 tablet (300 mg total) by mouth daily.  Dispense: 90 tablet; Refill: 3   Partially dictated using Editor, commissioning. Any errors are unintentional.  [name], MD [facility]  10/23/2020

## 2020-10-24 LAB — TSH: TSH: 1.43 u[IU]/mL (ref 0.450–4.500)

## 2020-10-24 LAB — COMPREHENSIVE METABOLIC PANEL
ALT: 16 IU/L (ref 0–32)
AST: 16 IU/L (ref 0–40)
Albumin/Globulin Ratio: 1.4 (ref 1.2–2.2)
Albumin: 4.2 g/dL (ref 3.7–4.7)
Alkaline Phosphatase: 101 IU/L (ref 44–121)
BUN/Creatinine Ratio: 8 — ABNORMAL LOW (ref 12–28)
BUN: 8 mg/dL (ref 8–27)
Bilirubin Total: 0.4 mg/dL (ref 0.0–1.2)
CO2: 26 mmol/L (ref 20–29)
Calcium: 9.7 mg/dL (ref 8.7–10.3)
Chloride: 98 mmol/L (ref 96–106)
Creatinine, Ser: 0.98 mg/dL (ref 0.57–1.00)
Globulin, Total: 2.9 g/dL (ref 1.5–4.5)
Glucose: 143 mg/dL — ABNORMAL HIGH (ref 65–99)
Potassium: 4.6 mmol/L (ref 3.5–5.2)
Sodium: 137 mmol/L (ref 134–144)
Total Protein: 7.1 g/dL (ref 6.0–8.5)
eGFR: 59 mL/min/{1.73_m2} — ABNORMAL LOW (ref >59)

## 2020-10-24 LAB — CBC WITH DIFFERENTIAL/PLATELET
Basophils Absolute: 0.1 10*3/uL (ref 0.0–0.2)
Basos: 1 %
EOS (ABSOLUTE): 0.1 10*3/uL (ref 0.0–0.4)
Eos: 2 %
Hematocrit: 36.8 % (ref 34.0–46.6)
Hemoglobin: 12.7 g/dL (ref 11.1–15.9)
Immature Grans (Abs): 0 10*3/uL (ref 0.0–0.1)
Immature Granulocytes: 0 %
Lymphocytes Absolute: 1.9 10*3/uL (ref 0.7–3.1)
Lymphs: 37 %
MCH: 31.5 pg (ref 26.6–33.0)
MCHC: 34.5 g/dL (ref 31.5–35.7)
MCV: 91 fL (ref 79–97)
Monocytes Absolute: 0.5 10*3/uL (ref 0.1–0.9)
Monocytes: 11 %
Neutrophils Absolute: 2.5 10*3/uL (ref 1.4–7.0)
Neutrophils: 49 %
Platelets: 225 10*3/uL (ref 150–450)
RBC: 4.03 x10E6/uL (ref 3.77–5.28)
RDW: 13 % (ref 11.7–15.4)
WBC: 5 10*3/uL (ref 3.4–10.8)

## 2020-10-24 LAB — LIPID PANEL
Chol/HDL Ratio: 2.6 ratio (ref 0.0–4.4)
Cholesterol, Total: 181 mg/dL (ref 100–199)
HDL: 70 mg/dL (ref >39)
LDL Chol Calc (NIH): 85 mg/dL (ref 0–99)
Triglycerides: 155 mg/dL — ABNORMAL HIGH (ref 0–149)
VLDL Cholesterol Cal: 26 mg/dL (ref 5–40)

## 2020-10-24 LAB — HEMOGLOBIN A1C
Est. average glucose Bld gHb Est-mCnc: 143 mg/dL
Hgb A1c MFr Bld: 6.6 % — ABNORMAL HIGH (ref 4.8–5.6)

## 2020-10-26 NOTE — Progress Notes (Signed)
Called patient and spoke with her informing of A1C controlled and other labs normal.

## 2020-10-27 DIAGNOSIS — E119 Type 2 diabetes mellitus without complications: Secondary | ICD-10-CM | POA: Diagnosis not present

## 2020-10-27 LAB — HM DIABETES EYE EXAM

## 2020-10-29 ENCOUNTER — Other Ambulatory Visit: Payer: Self-pay

## 2020-10-29 DIAGNOSIS — E118 Type 2 diabetes mellitus with unspecified complications: Secondary | ICD-10-CM

## 2020-10-29 DIAGNOSIS — G4733 Obstructive sleep apnea (adult) (pediatric): Secondary | ICD-10-CM | POA: Diagnosis not present

## 2020-10-29 MED ORDER — LANCETS 33G MISC: 1.0000 | Freq: Every day | 3 refills | Status: DC

## 2020-10-30 ENCOUNTER — Encounter: Payer: Self-pay | Admitting: Internal Medicine

## 2020-11-20 DIAGNOSIS — H524 Presbyopia: Secondary | ICD-10-CM | POA: Diagnosis not present

## 2020-11-20 DIAGNOSIS — H52223 Regular astigmatism, bilateral: Secondary | ICD-10-CM | POA: Diagnosis not present

## 2020-11-24 ENCOUNTER — Ambulatory Visit
Admission: RE | Admit: 2020-11-24 | Discharge: 2020-11-24 | Disposition: A | Discharge status: Home / Self Care | Payer: Medicare HMO | Attending: Internal Medicine | Admitting: Internal Medicine

## 2020-11-24 ENCOUNTER — Other Ambulatory Visit: Payer: Self-pay

## 2020-11-24 ENCOUNTER — Ambulatory Visit
Admission: RE | Admit: 2020-11-24 | Discharge: 2020-11-24 | Disposition: A | Discharge status: Home / Self Care | Payer: Medicare HMO | Source: Ambulatory Visit | Attending: Internal Medicine | Admitting: Internal Medicine

## 2020-11-24 ENCOUNTER — Ambulatory Visit (INDEPENDENT_AMBULATORY_CARE_PROVIDER_SITE_OTHER): Payer: Medicare HMO | Admitting: Internal Medicine

## 2020-11-24 ENCOUNTER — Encounter: Payer: Self-pay | Admitting: Internal Medicine

## 2020-11-24 VITALS — BP 134/76 | HR 82 | Temp 98.3°F | Ht 67.0 in | Wt 201.0 lb

## 2020-11-24 DIAGNOSIS — M25511 Pain in right shoulder: Secondary | ICD-10-CM | POA: Diagnosis not present

## 2020-11-24 DIAGNOSIS — G8929 Other chronic pain: Secondary | ICD-10-CM | POA: Diagnosis not present

## 2020-11-24 DIAGNOSIS — M7989 Other specified soft tissue disorders: Secondary | ICD-10-CM | POA: Diagnosis not present

## 2020-11-24 DIAGNOSIS — M19011 Primary osteoarthritis, right shoulder: Secondary | ICD-10-CM | POA: Diagnosis not present

## 2020-11-24 MED ORDER — POLYETHYLENE GLYCOL 3350 17 GM/SCOOP PO POWD: 17.0000 g | Freq: Two times a day (BID) | ORAL | 1 refills | Status: DC | PRN

## 2020-11-24 NOTE — Progress Notes (Signed)
Date:  11/24/2020   Name:  Molly Ruiz   DOB:  [birth year]   MRN:  [id number]   Chief Complaint: Shoulder Pain (Patient c/o of knot on the top of right shoulder. Hurts most when laying down at night. Sometimes can hurt when she moves it. No injury that she knows of. Started 2 years ago.)  Shoulder Pain  The pain is present in the right shoulder. This is a chronic problem. The problem has been gradually worsening. The quality of the pain is described as aching and dull. Associated symptoms include a limited range of motion. Pertinent negatives include no fever. The symptoms are aggravated by lying down and activity. She has tried NSAIDS and acetaminophen for the symptoms. The treatment provided mild relief.  She also has a mass or lipoma on top of her shoulder that she wonders about causing more pain. Lab Results  Component Value Date   CREATININE 0.98 10/23/2020   BUN 8 10/23/2020   NA 137 10/23/2020   K 4.6 10/23/2020   CL 98 10/23/2020   CO2 26 10/23/2020   Lab Results  Component Value Date   CHOL 181 10/23/2020   HDL 70 10/23/2020   LDLCALC 85 10/23/2020   TRIG 155 (H) 10/23/2020   CHOLHDL 2.6 10/23/2020   Lab Results  Component Value Date   TSH 1.430 10/23/2020   Lab Results  Component Value Date   HGBA1C 6.6 (H) 10/23/2020   Lab Results  Component Value Date   WBC 5.0 10/23/2020   HGB 12.7 10/23/2020   HCT 36.8 10/23/2020   MCV 91 10/23/2020   PLT 225 10/23/2020   Lab Results  Component Value Date   ALT 16 10/23/2020   AST 16 10/23/2020   ALKPHOS 101 10/23/2020   BILITOT 0.4 10/23/2020     Review of Systems  Constitutional:  Negative for chills, fatigue and fever.  Respiratory:  Negative for chest tightness and shortness of breath.   Cardiovascular:  Negative for chest pain, palpitations and leg swelling.  Musculoskeletal:  Positive for arthralgias (right shoulder).  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/2017   Primary osteoarthritis of left hip 01/12/2017   OSA on CPAP 01/02/2017   Type II diabetes mellitus with complication ([place]) 19/62/2297   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   Hyperlipidemia associated with type 2 diabetes mellitus ([place]) 06/24/2016   Obesity (BMI 30.0-34.9) 06/24/2016   Vitamin D deficiency 06/24/2016   History of shingles 06/24/2016   Allergic rhinitis 06/24/2016    Allergies  Allergen Reactions   Atorvastatin Other (See Comments)    Peripheral neuropathy and myalgia   Influenza Vaccines Rash    Past Surgical History:  Procedure Laterality Date   ABDOMINAL HYSTERECTOMY     BILATERAL CARPAL TUNNEL RELEASE     CATARACT EXTRACTION W/PHACO Right 11/12/2018   Procedure: CATARACT EXTRACTION PHACO AND INTRAOCULAR LENS PLACEMENT ([place])  RIGHT DIABETIC;  Surgeon: Eulogio Bear, MD;  Location: [place];  Service: Ophthalmology;  Laterality: Right;  Diabetic - diet controlled sleep apnea   CERVICAL FUSION  2004   COLONOSCOPY WITH PROPOFOL N/A 07/22/2019   Procedure: COLONOSCOPY WITH BIOPSIES;  Surgeon: Lucilla Lame, MD;  Location: [place];  Service: Endoscopy;  Laterality: N/A;  Diabetic - diet controlled priority [place]   POLYPECTOMY N/A 07/22/2019   Procedure: POLYPECTOMY;  Surgeon: Lucilla Lame, MD;  Location: [facility];  Service: Endoscopy;  Laterality: N/A;    Social History   Tobacco Use   Smoking status: Never   Smokeless tobacco: Never  Vaping Use   Vaping Use: Never  used  Substance Use Topics   Alcohol use: No   Drug use: No     Medication list has been reviewed and updated.  Current Meds  Medication Sig   acetaminophen (TYLENOL) 500 MG tablet Take 2 tablets (1,000 mg total) by mouth 2 (two) times daily.   amLODipine (NORVASC) 5 MG tablet Take 1 tablet by mouth once daily   buPROPion (WELLBUTRIN XL) 300 MG 24 hr tablet Take 1 tablet (300 mg total) by mouth daily.   CALCIUM CITRATE PO Take 1,200 mg by mouth daily.   cholecalciferol (VITAMIN D3) 25 MCG (1000 UNIT) tablet Take 1,000 Units by mouth daily.   ezetimibe (ZETIA) 10 MG tablet Take 1 tablet by mouth once daily   glucose blood (ONETOUCH ULTRA) test strip Test Blood Sugar twice daily.   irbesartan (AVAPRO) 300 MG tablet Take 1 tablet (300 mg total) by mouth daily.   Lancets 33G MISC 1 each by Does not apply route daily.   meloxicam (MOBIC) 15 MG tablet Take 1 tablet (15 mg total) by mouth daily.   Multiple Vitamins-Minerals (MULTIVITAMIN ADULTS 50+ PO) Take 1 tablet by mouth daily.   NON FORMULARY CPAP @@ 12 cm H20   Omega-3 Fatty Acids (FISH OIL) 1000 MG CAPS Take 1 capsule by mouth daily.   REFRESH 1.4-0.6 % SOLN Place 1 drop into both eyes as needed.   triamcinolone (KENALOG) 0.025 % cream Apply topically.   [DISCONTINUED] polyethylene glycol powder (GLYCOLAX/MIRALAX) 17 GM/SCOOP powder Take 17 g by mouth 2 (two) times daily as needed for moderate constipation.    PHQ 2/9 Scores 10/23/2020 09/24/2020 08/11/2020 07/23/2020  PHQ - 2 Score 0 0 0 0  PHQ- 9 Score 2 1 0 0    GAD 7 : Generalized Anxiety Score 10/23/2020 09/24/2020 08/11/2020 07/23/2020  Nervous, Anxious, on Edge 1 0 0 0  Control/stop worrying 0 1 0 0  Worry too much - different things 0 1 0 0  Trouble relaxing 0 0 0 0  Restless 0 0 0 0  Easily annoyed or irritable 0 0 0 0  Afraid - awful might happen 0 0 0 0  Total GAD 7 Score 1 2 0 0  Anxiety Difficulty Not difficult at all - Not difficult at all Not difficult at all    BP  Readings from Last 3 Encounters:  11/24/20 134/76  10/23/20 128/66  09/24/20 124/60    Physical Exam Vitals and nursing note reviewed.  Constitutional:      General: She is not in acute distress.    Appearance: Normal appearance. She is well-developed.  HENT:     Head: Normocephalic and atraumatic.  Cardiovascular:     Rate and Rhythm: Normal rate and regular rhythm.  Pulmonary:     Effort: Pulmonary effort is normal. No respiratory distress.  Breath sounds: No wheezing or rhonchi.  Musculoskeletal:     Right shoulder: Swelling (soft tissue mass) and tenderness present. No crepitus. Decreased range of motion. Normal strength.  Skin:    General: Skin is warm and dry.     Findings: No rash.  Neurological:     Mental Status: She is alert and oriented to person, place, and time.  Psychiatric:        Mood and Affect: Mood normal.        Behavior: Behavior normal.    Wt Readings from Last 3 Encounters:  11/24/20 201 lb (91.2 kg)  10/23/20 199 lb 9.6 oz (90.5 kg)  09/24/20 202 lb (91.6 kg)    BP 134/76 (BP Location: Right Arm, Patient Position: Sitting, Cuff Size: Large)   Pulse 82   Temp 98.3 F (36.8 C) (Oral)   Ht 5\' 7"  (1.702 m)   Wt 201 lb (91.2 kg)   SpO2 97%   BMI 31.48 kg/m   Assessment and Plan: 1. Chronic right shoulder pain Continue Mobic or tylenol as needed Will get xrays and then advise Doubt the soft tissue mass causing the discomfort. - DG Shoulder Right; Future   Partially dictated using Dragon software. Any errors are unintentional.  [name], MD [facility]  11/24/2020

## 2020-11-25 ENCOUNTER — Other Ambulatory Visit: Payer: Self-pay

## 2020-11-25 DIAGNOSIS — S46011A Strain of muscle(s) and tendon(s) of the rotator cuff of right shoulder, initial encounter: Secondary | ICD-10-CM

## 2020-11-28 DIAGNOSIS — G4733 Obstructive sleep apnea (adult) (pediatric): Secondary | ICD-10-CM | POA: Diagnosis not present

## 2020-12-08 ENCOUNTER — Other Ambulatory Visit: Payer: Self-pay | Admitting: Orthopedic Surgery

## 2020-12-08 DIAGNOSIS — M19011 Primary osteoarthritis, right shoulder: Secondary | ICD-10-CM | POA: Diagnosis not present

## 2020-12-08 DIAGNOSIS — M7551 Bursitis of right shoulder: Secondary | ICD-10-CM | POA: Diagnosis not present

## 2020-12-08 DIAGNOSIS — M25811 Other specified joint disorders, right shoulder: Secondary | ICD-10-CM | POA: Diagnosis not present

## 2020-12-08 DIAGNOSIS — E669 Obesity, unspecified: Secondary | ICD-10-CM | POA: Diagnosis not present

## 2020-12-08 DIAGNOSIS — E118 Type 2 diabetes mellitus with unspecified complications: Secondary | ICD-10-CM | POA: Diagnosis not present

## 2020-12-22 ENCOUNTER — Ambulatory Visit
Admission: RE | Admit: 2020-12-22 | Discharge: 2020-12-22 | Disposition: A | Discharge status: Home / Self Care | Payer: Medicare HMO | Source: Ambulatory Visit | Attending: Orthopedic Surgery | Admitting: Orthopedic Surgery

## 2020-12-22 ENCOUNTER — Other Ambulatory Visit: Payer: Self-pay

## 2020-12-22 DIAGNOSIS — M25811 Other specified joint disorders, right shoulder: Secondary | ICD-10-CM | POA: Diagnosis not present

## 2020-12-22 DIAGNOSIS — M67411 Ganglion, right shoulder: Secondary | ICD-10-CM | POA: Diagnosis not present

## 2020-12-22 DIAGNOSIS — M19011 Primary osteoarthritis, right shoulder: Secondary | ICD-10-CM | POA: Diagnosis not present

## 2020-12-22 DIAGNOSIS — M7551 Bursitis of right shoulder: Secondary | ICD-10-CM

## 2020-12-22 DIAGNOSIS — M75101 Unspecified rotator cuff tear or rupture of right shoulder, not specified as traumatic: Secondary | ICD-10-CM | POA: Diagnosis not present

## 2021-01-05 DIAGNOSIS — M19011 Primary osteoarthritis, right shoulder: Secondary | ICD-10-CM | POA: Diagnosis not present

## 2021-01-11 ENCOUNTER — Other Ambulatory Visit: Payer: Self-pay | Admitting: Internal Medicine

## 2021-01-18 ENCOUNTER — Ambulatory Visit: Payer: Self-pay | Admitting: *Deleted

## 2021-01-18 ENCOUNTER — Other Ambulatory Visit: Payer: Self-pay | Admitting: Internal Medicine

## 2021-01-18 DIAGNOSIS — K30 Functional dyspepsia: Secondary | ICD-10-CM | POA: Diagnosis not present

## 2021-01-18 DIAGNOSIS — G629 Polyneuropathy, unspecified: Secondary | ICD-10-CM | POA: Diagnosis not present

## 2021-01-18 DIAGNOSIS — E1140 Type 2 diabetes mellitus with diabetic neuropathy, unspecified: Secondary | ICD-10-CM | POA: Diagnosis not present

## 2021-01-18 DIAGNOSIS — R079 Chest pain, unspecified: Secondary | ICD-10-CM | POA: Diagnosis not present

## 2021-01-18 DIAGNOSIS — E1169 Type 2 diabetes mellitus with other specified complication: Secondary | ICD-10-CM

## 2021-01-18 DIAGNOSIS — E785 Hyperlipidemia, unspecified: Secondary | ICD-10-CM | POA: Diagnosis not present

## 2021-01-18 DIAGNOSIS — M25552 Pain in left hip: Secondary | ICD-10-CM | POA: Diagnosis not present

## 2021-01-18 DIAGNOSIS — R200 Anesthesia of skin: Secondary | ICD-10-CM | POA: Diagnosis not present

## 2021-01-18 DIAGNOSIS — M160 Bilateral primary osteoarthritis of hip: Secondary | ICD-10-CM | POA: Diagnosis not present

## 2021-01-18 DIAGNOSIS — I10 Essential (primary) hypertension: Secondary | ICD-10-CM | POA: Diagnosis not present

## 2021-01-18 DIAGNOSIS — R0789 Other chest pain: Secondary | ICD-10-CM | POA: Diagnosis not present

## 2021-01-18 DIAGNOSIS — M1711 Unilateral primary osteoarthritis, right knee: Secondary | ICD-10-CM | POA: Diagnosis not present

## 2021-01-18 DIAGNOSIS — M11261 Other chondrocalcinosis, right knee: Secondary | ICD-10-CM | POA: Diagnosis not present

## 2021-01-18 DIAGNOSIS — G8929 Other chronic pain: Secondary | ICD-10-CM | POA: Diagnosis not present

## 2021-01-18 DIAGNOSIS — M25561 Pain in right knee: Secondary | ICD-10-CM | POA: Diagnosis not present

## 2021-01-18 NOTE — Telephone Encounter (Signed)
Noted  Pt advised to go to ED.  KP 

## 2021-01-18 NOTE — Telephone Encounter (Signed)
Patient calls with numbness from the right knee to the ankle intermittenly for 2 months but worsening over the last 4 days occurring all throughout the day. Upon walking is when she notices. No tingling/pain/weakness.  Patient also has chest heaviness for about one week that comes and goes. Takes alka seltzer but does not help. Also with increased indigestion after meals, "feels the stomach  is not right". Referred patient for evaluation at the ED. Patient agreed to go.       Reason for Disposition  [1] Numbness (i.e., loss of sensation) of the face, arm / hand, or leg / foot on one side of the body AND [2] gradual onset (e.g., days to weeks) AND [3] present now  Answer Assessment - Initial Assessment Questions 1. SYMPTOM: "What is the main symptom you are concerned about?" (e.g., weakness, numbness)     Numbness in right leg from knee to ankle 2. ONSET: "When did this start?" (minutes, hours, days; while sleeping)     2 months 3. LAST NORMAL: "When was the last time you (the patient) were normal (no symptoms)?"     2 months 4. PATTERN "Does this come and go, or has it been constant since it started?"  "Is it present now?"     Comes and goes 5. CARDIAC SYMPTOMS: "Have you had any of the following symptoms: chest pain, difficulty breathing, palpitations?"     Indigestion after eating and sometimes when waking up in the am. 6. NEUROLOGIC SYMPTOMS: "Have you had any of the following symptoms: headache, dizziness, vision loss, double vision, changes in speech, unsteady on your feet?"     Headaches-took allergy tab and headaches would stop 7. OTHER SYMPTOMS: "Do you have any other symptoms?"      8. PREGNANCY: "Is there any chance you are pregnant?" "When was your last menstrual period?"     na  Protocols used: Neurologic Deficit-A-AH

## 2021-01-19 DIAGNOSIS — R200 Anesthesia of skin: Secondary | ICD-10-CM | POA: Diagnosis not present

## 2021-01-19 DIAGNOSIS — M11261 Other chondrocalcinosis, right knee: Secondary | ICD-10-CM | POA: Diagnosis not present

## 2021-01-19 DIAGNOSIS — M1711 Unilateral primary osteoarthritis, right knee: Secondary | ICD-10-CM | POA: Diagnosis not present

## 2021-01-19 DIAGNOSIS — R079 Chest pain, unspecified: Secondary | ICD-10-CM | POA: Diagnosis not present

## 2021-01-19 DIAGNOSIS — M160 Bilateral primary osteoarthritis of hip: Secondary | ICD-10-CM | POA: Diagnosis not present

## 2021-01-21 DIAGNOSIS — M25511 Pain in right shoulder: Secondary | ICD-10-CM | POA: Diagnosis not present

## 2021-01-21 DIAGNOSIS — M19011 Primary osteoarthritis, right shoulder: Secondary | ICD-10-CM | POA: Diagnosis not present

## 2021-01-21 DIAGNOSIS — M25811 Other specified joint disorders, right shoulder: Secondary | ICD-10-CM | POA: Diagnosis not present

## 2021-01-21 DIAGNOSIS — M67411 Ganglion, right shoulder: Secondary | ICD-10-CM | POA: Diagnosis not present

## 2021-01-21 DIAGNOSIS — G8929 Other chronic pain: Secondary | ICD-10-CM | POA: Diagnosis not present

## 2021-02-12 ENCOUNTER — Ambulatory Visit (INDEPENDENT_AMBULATORY_CARE_PROVIDER_SITE_OTHER): Payer: Medicare HMO | Admitting: Internal Medicine

## 2021-02-12 ENCOUNTER — Other Ambulatory Visit: Payer: Self-pay

## 2021-02-12 ENCOUNTER — Ambulatory Visit: Payer: Self-pay | Admitting: *Deleted

## 2021-02-12 ENCOUNTER — Encounter: Payer: Self-pay | Admitting: Internal Medicine

## 2021-02-12 VITALS — BP 128/64 | HR 71 | Temp 98.3°F | Ht 67.0 in | Wt 205.6 lb

## 2021-02-12 DIAGNOSIS — K648 Other hemorrhoids: Secondary | ICD-10-CM | POA: Diagnosis not present

## 2021-02-12 DIAGNOSIS — K5901 Slow transit constipation: Secondary | ICD-10-CM | POA: Diagnosis not present

## 2021-02-12 NOTE — Telephone Encounter (Signed)
Patient is calling to report she is having rectal pressure and reports she has had an episode of blood with stool 2 weeks ago. Patient is concerned because she has had blood in her stool before and was unaware. Appointment has been scheduled by agent for today.

## 2021-02-12 NOTE — Telephone Encounter (Signed)
Reason for Disposition  [1] Rectal bleeding is minimal (e.g., blood just on toilet paper, few drops, streaks on surface of normal formed BM) AND [2] bleeding recurs 3 or more times on treatment  Answer Assessment - Initial Assessment Questions 1. APPEARANCE of BLOOD: "What color is it?" "Is it passed separately, on the surface of the stool, or mixed in with the stool?"      Separate from stool and when wiped on paper 2. AMOUNT: "How much blood was passed?"      Small amount 3. FREQUENCY: "How many times has blood been passed with the stools?"      Once- 2 weeks ago 4. ONSET: "When was the blood first seen in the stools?" (Days or weeks)      Only the one time 2 weeks ago 5. DIARRHEA: "Is there also some diarrhea?" If Yes, ask: "How many diarrhea stools in the past 24 hours?"     no 6. CONSTIPATION: "Do you have constipation?" If Yes, ask: "How bad is it?"     Yes-  intermittent constipation 7. RECURRENT SYMPTOMS: "Have you had blood in your stools before?" If Yes, ask: "When was the last time?" and "What happened that time?"      Yes- at least 2 years- polyp 8. BLOOD THINNERS: "Do you take any blood thinners?" (e.g., Coumadin/warfarin, Pradaxa/dabigatran, aspirin)     no 9. OTHER SYMPTOMS: "Do you have any other symptoms?"  (e.g., abdomen pain, vomiting, dizziness, fever)     Rectal pressure- comes and goes 10. PREGNANCY: "Is there any chance you are pregnant?" "When was your last menstrual period?"       N/a  Protocols used: Rectal Bleeding-A-AH

## 2021-02-12 NOTE — Progress Notes (Signed)
Date:  02/12/2021   Name:  Molly Ruiz   DOB:  12-Jun-1940   MRN:  [id number]   Chief Complaint: Blood In Stools  Molly Ruiz is a 80 y.o. female here for evaluation of blood in stool. Patient has associated symptoms of constipation and visible blood: just notes blood on TP. The patient denies abdominal pain. The patient has a known history of: no known risk factors. The patient has had 1 episodes of rectal bleeding. There is not a history of rectal injury. Patient has had similar episodes of rectal bleeding in the past. She had a colonoscopy last year - 2 benign polyps and internal hemorrhoids. This episode was 2 weeks ago - occurred once after straining with constipation.  She has not been taking miralax regularly.     Lab Results  Component Value Date   CREATININE 0.98 10/23/2020   BUN 8 10/23/2020   NA 137 10/23/2020   K 4.6 10/23/2020   CL 98 10/23/2020   CO2 26 10/23/2020   Lab Results  Component Value Date   CHOL 181 10/23/2020   HDL 70 10/23/2020   LDLCALC 85 10/23/2020   TRIG 155 (H) 10/23/2020   CHOLHDL 2.6 10/23/2020   Lab Results  Component Value Date   TSH 1.430 10/23/2020   Lab Results  Component Value Date   HGBA1C 6.6 (H) 10/23/2020   Lab Results  Component Value Date   WBC 5.0 10/23/2020   HGB 12.7 10/23/2020   HCT 36.8 10/23/2020   MCV 91 10/23/2020   PLT 225 10/23/2020   Lab Results  Component Value Date   ALT 16 10/23/2020   AST 16 10/23/2020   ALKPHOS 101 10/23/2020   BILITOT 0.4 10/23/2020     Review of Systems  Constitutional:  Negative for chills, fatigue and fever.  Respiratory:  Negative for chest tightness and shortness of breath.   Cardiovascular:  Negative for chest pain.  Gastrointestinal:  Positive for anal bleeding (one episode of scant BRB) and constipation. Negative for abdominal pain, diarrhea, nausea and vomiting.  Neurological:  Negative for dizziness and weakness.   Patient Active Problem List   Diagnosis  Date Noted   Acute pain of right shoulder 09/24/2020   Myalgia due to statin 03/31/2020   Gastroesophageal reflux disease 09/19/2019   Special screening for malignant neoplasms, colon    Tubular adenoma of colon    Anxiety disorder 06/13/2019   Osteopenia determined by x-ray 06/06/2018   Essential hypertension 04/26/2018   Eczema 10/25/2017   Slow transit constipation 02/06/2017   Primary osteoarthritis of left hip 01/12/2017   OSA on CPAP 01/02/2017   Type II diabetes mellitus with complication ([place]) 40/98/1191   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   Hyperlipidemia associated with type 2 diabetes mellitus ([place]) 06/24/2016   Obesity (BMI 30.0-34.9) 06/24/2016   Vitamin D deficiency 06/24/2016   History of shingles 06/24/2016   Allergic rhinitis 06/24/2016    Allergies  Allergen Reactions   Atorvastatin Other (See Comments)    Peripheral neuropathy and myalgia   Influenza Vaccines Rash    Past Surgical History:  Procedure Laterality Date   ABDOMINAL HYSTERECTOMY     BILATERAL CARPAL TUNNEL RELEASE     CATARACT EXTRACTION W/PHACO Right 11/12/2018   Procedure: CATARACT EXTRACTION PHACO AND INTRAOCULAR LENS PLACEMENT ([place])  RIGHT DIABETIC;  Surgeon: Eulogio Bear, MD;  Location: [place];  Service: Ophthalmology;  Laterality: Right;  Diabetic - diet controlled  sleep apnea   CERVICAL FUSION  2004   COLONOSCOPY WITH PROPOFOL N/A 07/22/2019   Procedure: COLONOSCOPY WITH BIOPSIES;  Surgeon: Lucilla Lame, MD;  Location: [place];  Service: Endoscopy;  Laterality: N/A;  Diabetic - diet controlled priority [place]   POLYPECTOMY N/A 07/22/2019   Procedure: POLYPECTOMY;  Surgeon: Lucilla Lame, MD;  Location: [place];  Service: Endoscopy;  Laterality: N/A;    Social History   Tobacco Use   Smoking status: Never   Smokeless tobacco: Never  Vaping Use   Vaping Use: Never used   Substance Use Topics   Alcohol use: No   Drug use: No     Medication list has been reviewed and updated.  Current Meds  Medication Sig   acetaminophen (TYLENOL) 500 MG tablet Take 2 tablets (1,000 mg total) by mouth 2 (two) times daily.   amLODipine (NORVASC) 5 MG tablet Take 1 tablet by mouth once daily   buPROPion (WELLBUTRIN XL) 300 MG 24 hr tablet Take 1 tablet (300 mg total) by mouth daily.   CALCIUM CITRATE PO Take 1,200 mg by mouth daily.   cholecalciferol (VITAMIN D3) 25 MCG (1000 UNIT) tablet Take 1,000 Units by mouth daily.   ezetimibe (ZETIA) 10 MG tablet Take 1 tablet by mouth once daily   glucose blood (ONETOUCH ULTRA) test strip Test Blood Sugar twice daily.   irbesartan (AVAPRO) 300 MG tablet Take 1 tablet (300 mg total) by mouth daily.   Lancets 33G MISC 1 each by Does not apply route daily.   meloxicam (MOBIC) 15 MG tablet Take 1 tablet (15 mg total) by mouth daily.   Multiple Vitamins-Minerals (MULTIVITAMIN ADULTS 50+ PO) Take 1 tablet by mouth daily.   NON FORMULARY CPAP @@ 12 cm H20   Omega-3 Fatty Acids (FISH OIL) 1000 MG CAPS Take 1 capsule by mouth daily.   polyethylene glycol powder (GLYCOLAX/MIRALAX) 17 GM/SCOOP powder Take 17 g by mouth 2 (two) times daily as needed for moderate constipation.   REFRESH 1.4-0.6 % SOLN Place 1 drop into both eyes as needed.   triamcinolone (KENALOG) 0.025 % cream Apply topically.    PHQ 2/9 Scores 02/12/2021 10/23/2020 09/24/2020 08/11/2020  PHQ - 2 Score 3 0 0 0  PHQ- 9 Score 4 2 1  0    GAD 7 : Generalized Anxiety Score 02/12/2021 10/23/2020 09/24/2020 08/11/2020  Nervous, Anxious, on Edge 0 1 0 0  Control/stop worrying 0 0 1 0  Worry too much - different things 0 0 1 0  Trouble relaxing 0 0 0 0  Restless 0 0 0 0  Easily annoyed or irritable 0 0 0 0  Afraid - awful might happen 0 0 0 0  Total GAD 7 Score 0 1 2 0  Anxiety Difficulty Not difficult at all Not difficult at all - Not difficult at all    BP Readings from Last 3  Encounters:  02/12/21 128/64  11/24/20 134/76  10/23/20 128/66    Physical Exam Vitals and nursing note reviewed.  Constitutional:      General: She is not in acute distress.    Appearance: Normal appearance. She is well-developed.  HENT:     Head: Normocephalic and atraumatic.  Cardiovascular:     Rate and Rhythm: Normal rate and regular rhythm.  Pulmonary:     Effort: Pulmonary effort is normal. No respiratory distress.     Breath sounds: No wheezing  or rhonchi.  Abdominal:     General: Bowel sounds are normal.     Palpations: Abdomen is soft.     Tenderness: There is no abdominal tenderness.  Musculoskeletal:     Cervical back: Normal range of motion.  Skin:    General: Skin is warm and dry.     Findings: No rash.  Neurological:     Mental Status: She is alert and oriented to person, place, and time.  Psychiatric:        Mood and Affect: Mood normal.        Behavior: Behavior normal.    Wt Readings from Last 3 Encounters:  02/12/21 205 lb 9.6 oz (93.3 kg)  11/24/20 201 lb (91.2 kg)  10/23/20 199 lb 9.6 oz (90.5 kg)    BP 128/64   Pulse 71   Temp 98.3 F (36.8 C) (Oral)   Ht 5\' 7"  (1.702 m)   Wt 205 lb 9.6 oz (93.3 kg)   SpO2 98%   BMI 32.20 kg/m   Assessment and Plan: 1. Internal hemorrhoid, bleeding One episode after straining at stool Essentially normal colonoscopy last year No recurrent bleeding - if this occurs may need anoscopy with GI  2. Slow transit constipation Resume Miralax daily to maintain regular stools   Partially dictated using Editor, commissioning. Any errors are unintentional.  [name], MD [facility]  02/12/2021

## 2021-03-08 DIAGNOSIS — M4306 Spondylolysis, lumbar region: Secondary | ICD-10-CM | POA: Diagnosis not present

## 2021-03-08 DIAGNOSIS — M9901 Segmental and somatic dysfunction of cervical region: Secondary | ICD-10-CM | POA: Diagnosis not present

## 2021-03-08 DIAGNOSIS — M40292 Other kyphosis, cervical region: Secondary | ICD-10-CM | POA: Diagnosis not present

## 2021-03-08 DIAGNOSIS — M9903 Segmental and somatic dysfunction of lumbar region: Secondary | ICD-10-CM | POA: Diagnosis not present

## 2021-03-10 DIAGNOSIS — M9903 Segmental and somatic dysfunction of lumbar region: Secondary | ICD-10-CM | POA: Diagnosis not present

## 2021-03-10 DIAGNOSIS — M9901 Segmental and somatic dysfunction of cervical region: Secondary | ICD-10-CM | POA: Diagnosis not present

## 2021-03-10 DIAGNOSIS — M4306 Spondylolysis, lumbar region: Secondary | ICD-10-CM | POA: Diagnosis not present

## 2021-03-10 DIAGNOSIS — M40292 Other kyphosis, cervical region: Secondary | ICD-10-CM | POA: Diagnosis not present

## 2021-03-15 DIAGNOSIS — M9901 Segmental and somatic dysfunction of cervical region: Secondary | ICD-10-CM | POA: Diagnosis not present

## 2021-03-15 DIAGNOSIS — M40292 Other kyphosis, cervical region: Secondary | ICD-10-CM | POA: Diagnosis not present

## 2021-03-15 DIAGNOSIS — M4306 Spondylolysis, lumbar region: Secondary | ICD-10-CM | POA: Diagnosis not present

## 2021-03-15 DIAGNOSIS — M9903 Segmental and somatic dysfunction of lumbar region: Secondary | ICD-10-CM | POA: Diagnosis not present

## 2021-03-17 DIAGNOSIS — M4306 Spondylolysis, lumbar region: Secondary | ICD-10-CM | POA: Diagnosis not present

## 2021-03-17 DIAGNOSIS — M40292 Other kyphosis, cervical region: Secondary | ICD-10-CM | POA: Diagnosis not present

## 2021-03-17 DIAGNOSIS — M9901 Segmental and somatic dysfunction of cervical region: Secondary | ICD-10-CM | POA: Diagnosis not present

## 2021-03-17 DIAGNOSIS — M9903 Segmental and somatic dysfunction of lumbar region: Secondary | ICD-10-CM | POA: Diagnosis not present

## 2021-03-23 DIAGNOSIS — M4306 Spondylolysis, lumbar region: Secondary | ICD-10-CM | POA: Diagnosis not present

## 2021-03-23 DIAGNOSIS — M40292 Other kyphosis, cervical region: Secondary | ICD-10-CM | POA: Diagnosis not present

## 2021-03-23 DIAGNOSIS — M9903 Segmental and somatic dysfunction of lumbar region: Secondary | ICD-10-CM | POA: Diagnosis not present

## 2021-03-23 DIAGNOSIS — M9901 Segmental and somatic dysfunction of cervical region: Secondary | ICD-10-CM | POA: Diagnosis not present

## 2021-04-19 ENCOUNTER — Other Ambulatory Visit: Payer: Self-pay | Admitting: Internal Medicine

## 2021-04-19 NOTE — Telephone Encounter (Signed)
Requested Prescriptions  Pending Prescriptions Disp Refills  . amLODipine (NORVASC) 5 MG tablet [Pharmacy Med Name: amLODIPine Besylate 5 MG Oral Tablet] 90 tablet 0    Sig: Take 1 tablet by mouth once daily     Cardiovascular:  Calcium Channel Blockers Passed - 04/19/2021  5:31 AM      Passed - Last BP in normal range    BP Readings from Last 1 Encounters:  02/12/21 128/64         Passed - Valid encounter within last 6 months    Recent Outpatient Visits          2 months ago Internal hemorrhoid, bleeding   [facility] Glean Hess, MD   4 months ago Chronic right shoulder pain   [facility] Glean Hess, MD   5 months ago Annual physical exam   [facility] Glean Hess, MD   6 months ago Anxiety disorder, unspecified type   [facility] Glean Hess, MD   8 months ago Primary osteoarthritis of left hip   [facility] Montel Culver, MD      Future Appointments            In 1 week Army Melia Jesse Sans, MD [facility], [place]   In 6 months Army Melia, Jesse Sans, MD [facility], [facility]

## 2021-04-26 ENCOUNTER — Other Ambulatory Visit: Payer: Self-pay

## 2021-04-26 ENCOUNTER — Encounter: Payer: Self-pay | Admitting: Internal Medicine

## 2021-04-26 ENCOUNTER — Other Ambulatory Visit: Payer: Self-pay | Admitting: Internal Medicine

## 2021-04-26 ENCOUNTER — Ambulatory Visit (INDEPENDENT_AMBULATORY_CARE_PROVIDER_SITE_OTHER): Payer: Medicare HMO | Admitting: Internal Medicine

## 2021-04-26 VITALS — BP 128/80 | HR 80 | Ht 67.0 in | Wt 203.2 lb

## 2021-04-26 DIAGNOSIS — E1169 Type 2 diabetes mellitus with other specified complication: Secondary | ICD-10-CM | POA: Diagnosis not present

## 2021-04-26 DIAGNOSIS — E785 Hyperlipidemia, unspecified: Secondary | ICD-10-CM | POA: Diagnosis not present

## 2021-04-26 DIAGNOSIS — K219 Gastro-esophageal reflux disease without esophagitis: Secondary | ICD-10-CM | POA: Diagnosis not present

## 2021-04-26 DIAGNOSIS — I10 Essential (primary) hypertension: Secondary | ICD-10-CM | POA: Diagnosis not present

## 2021-04-26 DIAGNOSIS — E118 Type 2 diabetes mellitus with unspecified complications: Secondary | ICD-10-CM | POA: Diagnosis not present

## 2021-04-26 LAB — POCT GLYCOSYLATED HEMOGLOBIN (HGB A1C): Hemoglobin A1C: 6.4 % — AB (ref 4.0–5.6)

## 2021-04-26 MED ORDER — FAMOTIDINE 40 MG PO TABS: 40.0000 mg | ORAL_TABLET | Freq: Every day | ORAL | 0 refills | Status: DC

## 2021-04-26 MED ORDER — IRBESARTAN 300 MG PO TABS: 300.0000 mg | ORAL_TABLET | Freq: Every day | ORAL | 1 refills | Status: DC

## 2021-04-26 NOTE — Progress Notes (Signed)
Date:  04/26/2021   Name:  Molly Ruiz   DOB:  15-Jun-1940   MRN:  [id number]   Chief Complaint: Diabetes (POCT A1C and Urine Micro.)  Diabetes She presents for her follow-up diabetic visit. She has type 2 diabetes mellitus. Pertinent negatives for hypoglycemia include no headaches, nervousness/anxiousness or tremors. Pertinent negatives for diabetes include no chest pain, no fatigue, no polydipsia and no polyuria.  Hyperlipidemia This is a chronic problem. Recent lipid tests were reviewed and are high. Pertinent negatives include no chest pain or shortness of breath. Current antihyperlipidemic treatment includes ezetimibe (could not tolerate atorvastatin).  Gastroesophageal Reflux She complains of heartburn. She reports no abdominal pain, no chest pain or no coughing. This is a new problem. The current episode started more than 1 month ago. The problem occurs frequently. The problem has been unchanged. The heartburn duration is less than a minute. The heartburn is located in the substernum. The heartburn is of mild intensity. The heartburn wakes (mostly noticed in am upon waking) her from sleep. The symptoms are aggravated by lying down. Pertinent negatives include no fatigue. She has tried nothing (previously took pantoprazole) for the symptoms.   Lab Results  Component Value Date   NA 137 10/23/2020   K 4.6 10/23/2020   CO2 26 10/23/2020   GLUCOSE 143 (H) 10/23/2020   BUN 8 10/23/2020   CREATININE 0.98 10/23/2020   CALCIUM 9.7 10/23/2020   EGFR 59 (L) 10/23/2020   GFRNONAA 55 (L) 11/25/2019   Lab Results  Component Value Date   CHOL 181 10/23/2020   HDL 70 10/23/2020   LDLCALC 85 10/23/2020   TRIG 155 (H) 10/23/2020   CHOLHDL 2.6 10/23/2020   Lab Results  Component Value Date   TSH 1.430 10/23/2020   Lab Results  Component Value Date   HGBA1C 6.4 (A) 04/26/2021   Lab Results  Component Value Date   WBC 5.0 10/23/2020   HGB 12.7 10/23/2020   HCT 36.8 10/23/2020    MCV 91 10/23/2020   PLT 225 10/23/2020   Lab Results  Component Value Date   ALT 16 10/23/2020   AST 16 10/23/2020   ALKPHOS 101 10/23/2020   BILITOT 0.4 10/23/2020   Lab Results  Component Value Date   VD25OH 25.8 (L) 08/17/2017     Review of Systems  Constitutional:  Negative for appetite change, fatigue, fever and unexpected weight change.  HENT:  Negative for tinnitus and trouble swallowing.   Eyes:  Negative for visual disturbance.  Respiratory:  Negative for cough, chest tightness and shortness of breath.   Cardiovascular:  Negative for chest pain, palpitations and leg swelling.  Gastrointestinal:  Positive for heartburn. Negative for abdominal pain.       Gerd  Endocrine: Negative for polydipsia and polyuria.  Genitourinary:  Negative for dysuria and hematuria.  Musculoskeletal:  Negative for arthralgias.  Neurological:  Negative for tremors, numbness and headaches.  Psychiatric/Behavioral:  Negative for dysphoric mood and sleep disturbance. The patient is not nervous/anxious.    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/2017   Primary osteoarthritis of left  hip 01/12/2017   OSA on CPAP 01/02/2017   Type II diabetes mellitus with complication ([place]) 09/98/3382   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   Hyperlipidemia associated with type 2 diabetes mellitus ([place]) 06/24/2016   Obesity (BMI 30.0-34.9) 06/24/2016   Vitamin D deficiency 06/24/2016   History of shingles 06/24/2016   Allergic rhinitis 06/24/2016    Allergies  Allergen Reactions   Atorvastatin Other (See  Comments)    Peripheral neuropathy and myalgia   Influenza Vaccines Rash    Past Surgical History:  Procedure Laterality Date   ABDOMINAL HYSTERECTOMY     BILATERAL CARPAL TUNNEL RELEASE     CATARACT EXTRACTION W/PHACO Right 11/12/2018   Procedure: CATARACT EXTRACTION PHACO AND INTRAOCULAR LENS PLACEMENT ([place])  RIGHT DIABETIC;  Surgeon: Eulogio Bear, MD;  Location: [place];  Service: Ophthalmology;  Laterality: Right;  Diabetic - diet controlled sleep apnea   CERVICAL FUSION  2004   COLONOSCOPY WITH PROPOFOL N/A 07/22/2019   Procedure: COLONOSCOPY WITH BIOPSIES;  Surgeon: Lucilla Lame, MD;  Location: [place];  Service: Endoscopy;  Laterality: N/A;  Diabetic - diet controlled priority [place]   POLYPECTOMY N/A 07/22/2019   Procedure: POLYPECTOMY;  Surgeon: Lucilla Lame, MD;  Location: [place];  Service: Endoscopy;  Laterality: N/A;    Social History   Tobacco Use   Smoking status: Never   Smokeless tobacco: Never  Vaping Use   Vaping Use: Never used  Substance Use Topics   Alcohol use: No   Drug use: No     Medication list has been reviewed and updated.  Current Meds  Medication Sig   acetaminophen (TYLENOL) 500 MG tablet Take 2 tablets (1,000 mg total) by mouth 2 (two) times daily.   amLODipine (NORVASC) 5 MG tablet Take 1 tablet by mouth once daily   buPROPion (WELLBUTRIN XL) 300 MG 24 hr tablet Take 1 tablet (300 mg total) by mouth daily.   CALCIUM CITRATE PO Take 1,200 mg by mouth daily.   cholecalciferol (VITAMIN D3) 25 MCG (1000 UNIT) tablet Take 1,000 Units by mouth daily.   ezetimibe (ZETIA) 10 MG tablet Take 1 tablet by mouth once daily   famotidine (PEPCID) 40 MG tablet Take 1 tablet (40 mg total) by mouth at bedtime.   glucose blood (ONETOUCH ULTRA) test strip Test Blood Sugar twice daily.   Lancets 33G MISC 1 each by Does not apply route daily.   meloxicam (MOBIC) 15 MG tablet Take  1 tablet (15 mg total) by mouth daily.   Multiple Vitamins-Minerals (MULTIVITAMIN ADULTS 50+ PO) Take 1 tablet by mouth daily.   NON FORMULARY CPAP @@ 12 cm H20   Omega-3 Fatty Acids (FISH OIL) 1000 MG CAPS Take 1 capsule by mouth daily.   polyethylene glycol powder (GLYCOLAX/MIRALAX) 17 GM/SCOOP powder Take 17 g by mouth 2 (two) times daily as needed for moderate constipation.   REFRESH 1.4-0.6 % SOLN Place 1 drop into both eyes as needed.   triamcinolone (KENALOG) 0.025 % cream Apply topically.   [DISCONTINUED] irbesartan (AVAPRO) 300 MG tablet Take 1 tablet (300 mg total) by mouth daily.    PHQ 2/9 Scores 04/26/2021 02/12/2021 10/23/2020 09/24/2020  PHQ - 2 Score 0 3 0 0  PHQ- 9 Score 2 4 2 1     GAD 7 : Generalized Anxiety Score 04/26/2021 02/12/2021 10/23/2020 09/24/2020  Nervous, Anxious, on Edge 0 0 1 0  Control/stop worrying 0 0 0 1  Worry too much - different things 0 0 0 1  Trouble relaxing 0 0 0 0  Restless 0 0 0 0  Easily annoyed or irritable 0 0 0 0  Afraid - awful might happen 0 0 0 0  Total GAD 7 Score 0 0 1 2  Anxiety Difficulty Not difficult at all Not difficult at all Not difficult at all -    BP Readings from Last 3 Encounters:  04/26/21 128/80  02/12/21 128/64  11/24/20 134/76    Physical Exam Vitals and nursing note reviewed.  Constitutional:      General: She is not in acute distress.    Appearance: She is well-developed.  HENT:     Head: Normocephalic and atraumatic.  Cardiovascular:     Rate and Rhythm: Normal rate and regular rhythm.     Pulses: Normal pulses.  Pulmonary:     Effort: Pulmonary effort is normal. No respiratory distress.     Breath sounds: No wheezing or rhonchi.  Abdominal:     Palpations: Abdomen is soft.     Tenderness: There is no abdominal tenderness. There is no guarding.  Musculoskeletal:     Cervical back: Normal range of motion.     Right lower leg: No edema.     Left lower leg: No edema.  Skin:    General: Skin is warm  and dry.     Findings: No rash.  Neurological:     Mental Status: She is alert and oriented to person, place, and time.  Psychiatric:        Mood and Affect: Mood normal.        Behavior: Behavior normal.    Wt Readings from Last 3 Encounters:  04/26/21 203 lb 3.2 oz (92.2 kg)  02/12/21 205 lb 9.6 oz (93.3 kg)  11/24/20 201 lb (91.2 kg)    BP 128/80    Pulse 80    Ht 5' 7"  (1.702 m)    Wt 203 lb 3.2 oz (92.2 kg)    SpO2 97%    BMI 31.83 kg/m   Assessment and Plan: 1. Essential hypertension Clinically stable exam with well controlled BP. Tolerating medications without side effects at this time. Pt to continue current regimen and low sodium diet; benefits of regular exercise as able discussed. - irbesartan (AVAPRO) 300 MG tablet; Take 1 tablet (300 mg total) by mouth daily.  Dispense: 90 tablet; Refill: 1  2. Type II diabetes mellitus with complication (H[facility]) Clinically stable by exam and report without s/s of hypoglycemia. DM complicated by hypertension and dyslipidemia. Controlled with diet and improved. - Microalbumin / creatinine urine ratio - POCT glycosylated hemoglobin (Hb A1C)  3. Hyperlipidemia associated with type 2 diabetes mellitus ([place]) On Zetia now due to statin intolerance.  4. Gastroesophageal reflux disease, unspecified whether esophagitis present Having more reflux symptoms upon waking. Will start Pepcid nightly - famotidine (PEPCID) 40 MG tablet; Take 1 tablet (40 mg total) by mouth at bedtime.  Dispense: 90 tablet; Refill: 0   Partially dictated using Editor, commissioning. Any errors are unintentional.  [name], MD [facility]  04/26/2021

## 2021-04-27 LAB — MICROALBUMIN / CREATININE URINE RATIO
Creatinine, Urine: 64.8 mg/dL
Microalb/Creat Ratio: 6 mg/g creat (ref 0–29)
Microalbumin, Urine: 3.8 ug/mL

## 2021-05-17 ENCOUNTER — Ambulatory Visit: Payer: Medicare HMO

## 2021-05-24 ENCOUNTER — Ambulatory Visit (INDEPENDENT_AMBULATORY_CARE_PROVIDER_SITE_OTHER): Payer: Medicare HMO

## 2021-05-24 DIAGNOSIS — Z0000 Encounter for general adult medical examination without abnormal findings: Secondary | ICD-10-CM

## 2021-05-24 DIAGNOSIS — Z1231 Encounter for screening mammogram for malignant neoplasm of breast: Secondary | ICD-10-CM

## 2021-05-24 NOTE — Progress Notes (Signed)
Subjective:   Molly Ruiz is a 81 y.o. female who presents for Medicare Annual (Subsequent) preventive examination.  Virtual Visit via Telephone Note  I connected with  [name] on 05/24/21 at  3:20 PM EST by telephone and verified that I am speaking with the correct person using two identifiers.  Location: Patient: home Provider: [facility] Persons participating in the virtual visit: [place]   I discussed the limitations, risks, security and privacy concerns of performing an evaluation and management service by telephone and the availability of in person appointments. The patient expressed understanding and agreed to proceed.  Interactive audio and video telecommunications were attempted between this nurse and patient, however failed, due to patient having technical difficulties OR patient did not have access to video capability.  We continued and completed visit with audio only.  Some vital signs may be absent or patient reported.   Clemetine Marker, LPN   Review of Systems     Cardiac Risk Factors include: advanced age (>91men, >60 women);diabetes mellitus;hypertension;dyslipidemia;obesity (BMI >30kg/m2)     Objective:    There were no vitals filed for this visit. There is no height or weight on file to calculate BMI.  Advanced Directives 05/24/2021 05/13/2020 07/22/2019 05/13/2019 04/18/2018 09/26/2016 06/24/2016  Does Patient Have a Medical Advance Directive? Yes Yes Yes No Yes Yes Yes  Type of Paramedic of [place];Living will [place];Living will Healthcare Power of [place];Living will [place]  Does patient want to make changes to medical advance directive? - - No - Patient declined Yes (MAU/Ambulatory/Procedural Areas - Information given) - - -  Copy of [place] in Chart? Yes - validated most recent copy  scanned in chart (See row information) Yes - validated most recent copy scanned in chart (See row information) Yes - validated most recent copy scanned in chart (See row information) - No - copy requested No - copy requested -    Current Medications (verified) Outpatient Encounter Medications as of 05/24/2021  Medication Sig   acetaminophen (TYLENOL) 500 MG tablet Take 2 tablets (1,000 mg total) by mouth 2 (two) times daily.   amLODipine (NORVASC) 5 MG tablet Take 1 tablet by mouth once daily   Bisacodyl (LAXATIVE PO) Take by mouth. Generic OTC PRN   buPROPion (WELLBUTRIN XL) 300 MG 24 hr tablet Take 1 tablet (300 mg total) by mouth daily.   CALCIUM CITRATE PO Take 1,200 mg by mouth daily.   cholecalciferol (VITAMIN D3) 25 MCG (1000 UNIT) tablet Take 1,000 Units by mouth daily.   ezetimibe (ZETIA) 10 MG tablet Take 1 tablet by mouth once daily   famotidine (PEPCID) 40 MG tablet Take 1 tablet (40 mg total) by mouth at bedtime.   glucose blood (ONETOUCH ULTRA) test strip Test Blood Sugar twice daily.   irbesartan (AVAPRO) 300 MG tablet Take 1 tablet (300 mg total) by mouth daily.   Lancets 33G MISC 1 each by Does not apply route daily.   Multiple Vitamins-Minerals (MULTIVITAMIN ADULTS 50+ PO) Take 1 tablet by mouth daily.   NON FORMULARY CPAP @@ 12 cm H20   Omega-3 Fatty Acids (FISH OIL) 1000 MG CAPS Take 1 capsule by mouth daily.   REFRESH 1.4-0.6 % SOLN Place 1 drop into both eyes as needed.   meloxicam (MOBIC) 15 MG tablet Take 1 tablet (15 mg total) by mouth daily. (Patient not taking: Reported  on 05/24/2021)   [DISCONTINUED] polyethylene glycol powder (GLYCOLAX/MIRALAX) 17 GM/SCOOP powder Take 17 g by mouth 2 (two) times daily as needed for moderate constipation.   [DISCONTINUED] triamcinolone (KENALOG) 0.025 % cream Apply topically.   No facility-administered encounter medications on file as of 05/24/2021.    Allergies (verified) Atorvastatin and Influenza vaccines   History: Past  Medical History:  Diagnosis Date   Complication of anesthesia    Hair falls out.   Depression    Diabetes mellitus without complication ([place])    Hyperlipidemia    Hypertension    Sleep apnea    CPAP   Wears dentures    partial upper   Past Surgical History:  Procedure Laterality Date   ABDOMINAL HYSTERECTOMY     BILATERAL CARPAL TUNNEL RELEASE     CATARACT EXTRACTION W/PHACO Right 11/12/2018   Procedure: CATARACT EXTRACTION PHACO AND INTRAOCULAR LENS PLACEMENT ([place])  RIGHT DIABETIC;  Surgeon: Eulogio Bear, MD;  Location: [facility];  Service: Ophthalmology;  Laterality: Right;  Diabetic - diet controlled sleep apnea   CERVICAL FUSION  2004   COLONOSCOPY WITH PROPOFOL N/A 07/22/2019   Procedure: COLONOSCOPY WITH BIOPSIES;  Surgeon: Lucilla Lame, MD;  Location: [facility];  Service: Endoscopy;  Laterality: N/A;  Diabetic - diet controlled priority [place]   POLYPECTOMY N/A 07/22/2019   Procedure: POLYPECTOMY;  Surgeon: Lucilla Lame, MD;  Location: [place];  Service: Endoscopy;  Laterality: N/A;   Family History  Problem Relation Age of Onset   Diabetes Mother    Stroke Mother    Heart disease Sister    Stroke Brother    Prostate cancer Brother    Breast cancer Other 36   Social History   Socioeconomic History   Marital status: Widowed    Spouse name: Not on file   Number of children: 3   Years of education: 12   Highest education level: High school graduate  Occupational History   Occupation: Retired  Tobacco Use   Smoking status: Never   Smokeless tobacco: Never  Vaping Use   Vaping Use: Never used  Substance and Sexual Activity   Alcohol use: No   Drug use: No   Sexual activity: Not Currently  Other Topics Concern   Not on file  Social History Narrative   Pt lives with her daughter   Social Determinants of Health   Financial Resource Strain: Low Risk    Difficulty of Paying Living  Expenses: Not very hard  Food Insecurity: No Food Insecurity   Worried About Charity fundraiser in the Last Year: Never true   [place] in the Last Year: Never true  Transportation Needs: No Transportation Needs   Lack of Transportation (Medical): No   Lack of Transportation (Non-Medical): No  Physical Activity: Sufficiently Active   Days of Exercise per Week: 3 days   Minutes of Exercise per Session: 60 min  Stress: No Stress Concern Present   Feeling of Stress : Not at all  Social Connections: Moderately Integrated   Frequency of Communication with Friends and Family: More than three times a week   Frequency of Social Gatherings with Friends and Family: Three times a week   Attends Religious Services: More than 4 times per year   Active Member of Clubs or Organizations: Yes   Attends Archivist Meetings: More than 4 times per year  Marital Status: Widowed    Tobacco Counseling Counseling given: Not Answered   Clinical Intake:  Pre-visit preparation completed: Yes  Pain : No/denies pain     Nutritional Risks: None Diabetes: Yes CBG done?: No Did pt. bring in CBG monitor from home?: No  How often do you need to have someone help you when you read instructions, pamphlets, or other written materials from your doctor or pharmacy?: 1 - Never  Nutrition Risk Assessment:  Has the patient had any N/V/D within the last 2 months?  No  Does the patient have any non-healing wounds?  No  Has the patient had any unintentional weight loss or weight gain?  No   Diabetes:  Is the patient diabetic?  Yes  If diabetic, was a CBG obtained today?  No  Did the patient bring in their glucometer from home?  No  How often do you monitor your CBG's? Twice weekly.   Financial Strains and Diabetes Management:  Are you having any financial strains with the device, your supplies or your medication? No .  Does the patient want to be seen by Chronic Care Management for  management of their diabetes?  No  Would the patient like to be referred to a Nutritionist or for Diabetic Management?  No   Diabetic Exams:  Diabetic Eye Exam: Completed 10/27/20.   Diabetic Foot Exam: Completed 07/13/20.   Interpreter Needed?: No  Information entered by :: Clemetine Marker LPN   Activities of Daily Living In your present state of health, do you have any difficulty performing the following activities: 05/24/2021 04/26/2021  Hearing? N N  Vision? N N  Comment - -  Difficulty concentrating or making decisions? N N  Walking or climbing stairs? N N  Comment - -  Dressing or bathing? N N  Doing errands, shopping? N N  Preparing Food and eating ? N -  Using the Toilet? N -  In the past six months, have you accidently leaked urine? Y -  Do you have problems with loss of bowel control? N -  Managing your Medications? N -  Managing your Finances? N -  Housekeeping or managing your Housekeeping? N -  Some recent data might be hidden    Patient Care Team: Glean Hess, MD as PCP - General (Internal Medicine) [facility] (Ophthalmology)  Indicate any recent Medical Services you may have received from other than [facility] providers in the past year (date may be approximate).     Assessment:   This is a routine wellness examination for [place].  Hearing/Vision screen Hearing Screening - Comments:: Pt denies hearing difficulty Vision Screening - Comments:: [facility] screenings at [facility] Dr. Edison Pace  Dietary issues and exercise activities discussed: Current Exercise Habits: Home exercise routine, Type of exercise: Other - see comments (cycling), Time (Minutes): 60, Frequency (Times/Week): 3, Weekly Exercise (Minutes/Week): 180, Intensity: Moderate, Exercise limited by: None identified   Goals Addressed   None    Depression Screen PHQ 2/9 Scores 05/24/2021 04/26/2021 02/12/2021 10/23/2020 09/24/2020 08/11/2020 07/23/2020  PHQ - 2 Score 0 0 3 0 0 0 0   PHQ- 9 Score 1 2 4 2 1  0 0    Fall Risk Fall Risk  05/24/2021 04/26/2021 02/12/2021 10/23/2020 09/24/2020  Falls in the past year? 0 0 0 0 0  Number falls in past yr: 0 0 0 0 -  Injury with Fall? 0 0 0 0 -  Risk for fall due to : No  Fall Risks No Fall Risks No Fall Risks - -  Follow up Falls prevention discussed Falls evaluation completed Falls evaluation completed Falls evaluation completed Falls evaluation completed    [place]:  Any stairs in or around the home? No  If so, are there any without handrails? No  Home free of loose throw rugs in walkways, pet beds, electrical cords, etc? Yes  Adequate lighting in your home to reduce risk of falls? Yes   ASSISTIVE DEVICES UTILIZED TO PREVENT FALLS:  Life alert? No  Use of a cane, walker or w/c? No  Grab bars in the bathroom? No  Shower chair or bench in shower? No  Elevated toilet seat or a handicapped toilet? No   TIMED UP AND GO:  Was the test performed? No . Telephonic visit.   Cognitive Function: Normal cognitive status assessed by direct observation by this Nurse Health Advisor. No abnormalities found.       6CIT Screen 05/13/2020 05/13/2019 04/18/2018 09/26/2016  What Year? 0 points 0 points 0 points 4 points  What month? 0 points 0 points 0 points 0 points  What time? 0 points 0 points 0 points 0 points  Count back from 20 0 points 0 points 0 points 0 points  Months in reverse 0 points 0 points 0 points 0 points  Repeat phrase 0 points 2 points 2 points 2 points  Total Score 0 2 2 6     Immunizations Immunization History  Administered Date(s) Administered   PFIZER(Purple Top)SARS-COV-2 Vaccination 07/04/2019, 07/30/2019, 03/21/2020   Pfizer Covid-19 Vaccine Bivalent Booster 58yrs & up 10/25/2020   Pneumococcal Conjugate-13 06/24/2016   Pneumococcal Polysaccharide-23 08/23/2017   Tdap 06/24/2016    TDAP status: Up to date  Flu Vaccine status: Declined, Education has been provided  regarding the importance of this vaccine but patient still declined. Advised may receive this vaccine at local pharmacy or Health Dept. Aware to provide a copy of the vaccination record if obtained from local pharmacy or Health Dept. Verbalized acceptance and understanding.  Pneumococcal vaccine status: Up to date  Covid-19 vaccine status: Completed vaccines  Qualifies for Shingles Vaccine? Yes   Zostavax completed No   Shingrix Completed?: No.    Education has been provided regarding the importance of this vaccine. Patient has been advised to call insurance company to determine out of pocket expense if they have not yet received this vaccine. Advised may also receive vaccine at local pharmacy or Health Dept. Verbalized acceptance and understanding.  Screening Tests Health Maintenance  Topic Date Due   Zoster Vaccines- Shingrix (1 of 2) Never done   MAMMOGRAM  04/01/2021   FOOT EXAM  07/13/2021   HEMOGLOBIN A1C  10/25/2021   OPHTHALMOLOGY EXAM  10/27/2021   COLONOSCOPY (Pts 45-5yrs Insurance coverage will need to be confirmed)  07/21/2024   TETANUS/TDAP  06/24/2026   Pneumonia Vaccine 57+ Years old  Completed   DEXA SCAN  Completed   COVID-19 Vaccine  Completed   HPV VACCINES  Aged Out   INFLUENZA VACCINE  Discontinued    Health Maintenance  Health Maintenance Due  Topic Date Due   Zoster Vaccines- Shingrix (1 of 2) Never done   MAMMOGRAM  04/01/2021    Colorectal cancer screening: Type of screening: Colonoscopy. Completed 07/22/19. Repeat every 5 years  Mammogram status: Completed 04/01/20. Repeat every year  Bone Density status: Completed 06/08/20. Results reflect: Bone density results: OSTEOPENIA. Repeat every 2 years.  Lung Cancer Screening: (Low  Dose CT Chest recommended if Age 66-80 years, 30 pack-year currently smoking OR have quit w/in 15years.) does not qualify.   Additional Screening:  Hepatitis C Screening: does not qualify  Vision Screening: Recommended  annual ophthalmology exams for early detection of glaucoma and other disorders of the eye. Is the patient up to date with their annual eye exam?  Yes  Who is the provider or what is the name of the office in which the patient attends annual eye exams? [facility].   Dental Screening: Recommended annual dental exams for proper oral hygiene  Community Resource Referral / Chronic Care Management: CRR required this visit?  No   CCM required this visit?  No      Plan:     I have personally reviewed and noted the following in the patients chart:   Medical and social history Use of alcohol, tobacco or illicit drugs  Current medications and supplements including opioid prescriptions.  Functional ability and status Nutritional status Physical activity Advanced directives List of other physicians Hospitalizations, surgeries, and ER visits in previous 12 months Vitals Screenings to include cognitive, depression, and falls Referrals and appointments  In addition, I have reviewed and discussed with patient certain preventive protocols, quality metrics, and best practice recommendations. A written personalized care plan for preventive services as well as general preventive health recommendations were provided to patient.     Clemetine Marker, LPN   10/23/735   Nurse Notes: none

## 2021-05-24 NOTE — Patient Instructions (Signed)
Molly Ruiz , Thank you for taking time to come for your Medicare Wellness Visit. I appreciate your ongoing commitment to your health goals. Please review the following plan we discussed and let me know if I can assist you in the future.   Screening recommendations/referrals: Colonoscopy: done 07/22/19. Repeat 07/2024 Mammogram: done 04/01/20. Please call [phone] to schedule your mammogram. Bone Density: done 06/08/20 Recommended yearly ophthalmology/optometry visit for glaucoma screening and checkup Recommended yearly dental visit for hygiene and checkup  Vaccinations: Influenza vaccine: declined Pneumococcal vaccine: done 08/23/17 Tdap vaccine: done 06/24/16 Shingles vaccine: Shingrix discussed. Please contact your pharmacy for coverage information.  Covid-19: done 07/04/19, 07/30/19 & 03/21/20  Conditions/risks identified: Keep up the great work!  Next appointment: Follow up in one year for your annual wellness visit    Preventive Care 65 Years and Older, Female Preventive care refers to lifestyle choices and visits with your health care provider that can promote health and wellness. What does preventive care include? A yearly physical exam. This is also called an annual well check. Dental exams once or twice a year. Routine eye exams. Ask your health care provider how often you should have your eyes checked. Personal lifestyle choices, including: Daily care of your teeth and gums. Regular physical activity. Eating a healthy diet. Avoiding tobacco and drug use. Limiting alcohol use. Practicing safe sex. Taking low-dose aspirin every day. Taking vitamin and mineral supplements as recommended by your health care provider. What happens during an annual well check? The services and screenings done by your health care provider during your annual well check will depend on your age, overall health, lifestyle risk factors, and family history of disease. Counseling  Your health care  provider may ask you questions about your: Alcohol use. Tobacco use. Drug use. Emotional well-being. Home and relationship well-being. Sexual activity. Eating habits. History of falls. Memory and ability to understand (cognition). Work and work Statistician. Reproductive health. Screening  You may have the following tests or measurements: Height, weight, and BMI. Blood pressure. Lipid and cholesterol levels. These may be checked every 5 years, or more frequently if you are over 81 years old. Skin check. Lung cancer screening. You may have this screening every year starting at age 81 if you have a 30-pack-year history of smoking and currently smoke or have quit within the past 15 years. Fecal occult blood test (FOBT) of the stool. You may have this test every year starting at age 81. Flexible sigmoidoscopy or colonoscopy. You may have a sigmoidoscopy every 5 years or a colonoscopy every 10 years starting at age 81. Hepatitis C blood test. Hepatitis B blood test. Sexually transmitted disease (STD) testing. Diabetes screening. This is done by checking your blood sugar (glucose) after you have not eaten for a while (fasting). You may have this done every 1-3 years. Bone density scan. This is done to screen for osteoporosis. You may have this done starting at age 81. Mammogram. This may be done every 1-2 years. Talk to your health care provider about how often you should have regular mammograms. Talk with your health care provider about your test results, treatment options, and if necessary, the need for more tests. Vaccines  Your health care provider may recommend certain vaccines, such as: Influenza vaccine. This is recommended every year. Tetanus, diphtheria, and acellular pertussis (Tdap, Td) vaccine. You may need a Td booster every 10 years. Zoster vaccine. You may need this after age 89. Pneumococcal 13-valent conjugate (PCV13) vaccine. One dose is recommended  after age  55. Pneumococcal polysaccharide (PPSV23) vaccine. One dose is recommended after age 25. Talk to your health care provider about which screenings and vaccines you need and how often you need them. This information is not intended to replace advice given to you by your health care provider. Make sure you discuss any questions you have with your health care provider. Document Released: 05/22/2015 Document Revised: 01/13/2016 Document Reviewed: 02/24/2015 Elsevier Interactive Patient Education  2017 [place] Prevention in the Home Falls can cause injuries. They can happen to people of all ages. There are many things you can do to make your home safe and to help prevent falls. What can I do on the outside of my home? Regularly fix the edges of walkways and driveways and fix any cracks. Remove anything that might make you trip as you walk through a door, such as a raised step or threshold. Trim any bushes or trees on the path to your home. Use bright outdoor lighting. Clear any walking paths of anything that might make someone trip, such as rocks or tools. Regularly check to see if handrails are loose or broken. Make sure that both sides of any steps have handrails. Any raised decks and porches should have guardrails on the edges. Have any leaves, snow, or ice cleared regularly. Use sand or salt on walking paths during winter. Clean up any spills in your garage right away. This includes oil or grease spills. What can I do in the bathroom? Use night lights. Install grab bars by the toilet and in the tub and shower. Do not use towel bars as grab bars. Use non-skid mats or decals in the tub or shower. If you need to sit down in the shower, use a plastic, non-slip stool. Keep the floor dry. Clean up any water that spills on the floor as soon as it happens. Remove soap buildup in the tub or shower regularly. Attach bath mats securely with double-sided non-slip rug tape. Do not have throw  rugs and other things on the floor that can make you trip. What can I do in the bedroom? Use night lights. Make sure that you have a light by your bed that is easy to reach. Do not use any sheets or blankets that are too big for your bed. They should not hang down onto the floor. Have a firm chair that has side arms. You can use this for support while you get dressed. Do not have throw rugs and other things on the floor that can make you trip. What can I do in the kitchen? Clean up any spills right away. Avoid walking on wet floors. Keep items that you use a lot in easy-to-reach places. If you need to reach something above you, use a strong step stool that has a grab bar. Keep electrical cords out of the way. Do not use floor polish or wax that makes floors slippery. If you must use wax, use non-skid floor wax. Do not have throw rugs and other things on the floor that can make you trip. What can I do with my stairs? Do not leave any items on the stairs. Make sure that there are handrails on both sides of the stairs and use them. Fix handrails that are broken or loose. Make sure that handrails are as long as the stairways. Check any carpeting to make sure that it is firmly attached to the stairs. Fix any carpet that is loose or worn. Avoid having throw  rugs at the top or bottom of the stairs. If you do have throw rugs, attach them to the floor with carpet tape. Make sure that you have a light switch at the top of the stairs and the bottom of the stairs. If you do not have them, ask someone to add them for you. What else can I do to help prevent falls? Wear shoes that: Do not have high heels. Have rubber bottoms. Are comfortable and fit you well. Are closed at the toe. Do not wear sandals. If you use a stepladder: Make sure that it is fully opened. Do not climb a closed stepladder. Make sure that both sides of the stepladder are locked into place. Ask someone to hold it for you, if  possible. Clearly mark and make sure that you can see: Any grab bars or handrails. First and last steps. Where the edge of each step is. Use tools that help you move around (mobility aids) if they are needed. These include: Canes. Walkers. Scooters. Crutches. Turn on the lights when you go into a dark area. Replace any light bulbs as soon as they burn out. Set up your furniture so you have a clear path. Avoid moving your furniture around. If any of your floors are uneven, fix them. If there are any pets around you, be aware of where they are. Review your medicines with your doctor. Some medicines can make you feel dizzy. This can increase your chance of falling. Ask your doctor what other things that you can do to help prevent falls. This information is not intended to replace advice given to you by your health care provider. Make sure you discuss any questions you have with your health care provider. Document Released: 02/19/2009 Document Revised: 10/01/2015 Document Reviewed: 05/30/2014 Elsevier Interactive Patient Education  2017 [employer].

## 2021-06-03 ENCOUNTER — Telehealth: Payer: Self-pay

## 2021-06-03 NOTE — Telephone Encounter (Signed)
Spoke to pt. Reminder her to call and schedule her mammogram. Pt verbalized understanding. [phone]  KP

## 2021-06-09 DIAGNOSIS — E669 Obesity, unspecified: Secondary | ICD-10-CM | POA: Diagnosis not present

## 2021-06-09 DIAGNOSIS — Z8249 Family history of ischemic heart disease and other diseases of the circulatory system: Secondary | ICD-10-CM | POA: Diagnosis not present

## 2021-06-09 DIAGNOSIS — F411 Generalized anxiety disorder: Secondary | ICD-10-CM | POA: Diagnosis not present

## 2021-06-09 DIAGNOSIS — E1151 Type 2 diabetes mellitus with diabetic peripheral angiopathy without gangrene: Secondary | ICD-10-CM | POA: Diagnosis not present

## 2021-06-09 DIAGNOSIS — G4733 Obstructive sleep apnea (adult) (pediatric): Secondary | ICD-10-CM | POA: Diagnosis not present

## 2021-06-09 DIAGNOSIS — F325 Major depressive disorder, single episode, in full remission: Secondary | ICD-10-CM | POA: Diagnosis not present

## 2021-06-09 DIAGNOSIS — K219 Gastro-esophageal reflux disease without esophagitis: Secondary | ICD-10-CM | POA: Diagnosis not present

## 2021-06-09 DIAGNOSIS — I10 Essential (primary) hypertension: Secondary | ICD-10-CM | POA: Diagnosis not present

## 2021-06-09 DIAGNOSIS — E785 Hyperlipidemia, unspecified: Secondary | ICD-10-CM | POA: Diagnosis not present

## 2021-06-09 DIAGNOSIS — E1136 Type 2 diabetes mellitus with diabetic cataract: Secondary | ICD-10-CM | POA: Diagnosis not present

## 2021-06-09 DIAGNOSIS — M1990 Unspecified osteoarthritis, unspecified site: Secondary | ICD-10-CM | POA: Diagnosis not present

## 2021-06-09 DIAGNOSIS — Z008 Encounter for other general examination: Secondary | ICD-10-CM | POA: Diagnosis not present

## 2021-06-09 DIAGNOSIS — Z7982 Long term (current) use of aspirin: Secondary | ICD-10-CM | POA: Diagnosis not present

## 2021-06-09 DIAGNOSIS — R69 Illness, unspecified: Secondary | ICD-10-CM | POA: Diagnosis not present

## 2021-06-16 DIAGNOSIS — G4733 Obstructive sleep apnea (adult) (pediatric): Secondary | ICD-10-CM | POA: Diagnosis not present

## 2021-06-22 ENCOUNTER — Other Ambulatory Visit: Payer: Self-pay

## 2021-06-22 ENCOUNTER — Ambulatory Visit
Admission: RE | Admit: 2021-06-22 | Discharge: 2021-06-22 | Disposition: A | Discharge status: Home / Self Care | Payer: Medicare HMO | Source: Ambulatory Visit | Attending: Internal Medicine | Admitting: Internal Medicine

## 2021-06-22 DIAGNOSIS — Z1231 Encounter for screening mammogram for malignant neoplasm of breast: Secondary | ICD-10-CM | POA: Insufficient documentation

## 2021-07-12 ENCOUNTER — Other Ambulatory Visit: Payer: Self-pay | Admitting: Internal Medicine

## 2021-07-12 DIAGNOSIS — E1169 Type 2 diabetes mellitus with other specified complication: Secondary | ICD-10-CM

## 2021-07-12 DIAGNOSIS — E785 Hyperlipidemia, unspecified: Secondary | ICD-10-CM

## 2021-07-13 NOTE — Telephone Encounter (Signed)
Requested Prescriptions  ?Pending Prescriptions Disp Refills  ?? ezetimibe (ZETIA) 10 MG tablet [Pharmacy Med Name: Ezetimibe 10 MG Oral Tablet] 90 tablet 0  ?  Sig: Take 1 tablet by mouth once daily  ?  ? Cardiovascular:  Antilipid - Sterol Transport Inhibitors Failed - 07/12/2021  4:51 PM  ?  ?  Failed - Lipid Panel in normal range within the last 12 months  ?  Cholesterol, Total  ?Date Value Ref Range Status  ?10/23/2020 181 100 - 199 mg/dL Final  ? ?LDL Chol Calc (NIH)  ?Date Value Ref Range Status  ?10/23/2020 85 0 - 99 mg/dL Final  ? ?HDL  ?Date Value Ref Range Status  ?10/23/2020 70 >39 mg/dL Final  ? ?Triglycerides  ?Date Value Ref Range Status  ?10/23/2020 155 (H) 0 - 149 mg/dL Final  ? ?  ?  ?  Passed - AST in normal range and within 360 days  ?  AST  ?Date Value Ref Range Status  ?10/23/2020 16 0 - 40 IU/L Final  ?   ?  ?  Passed - ALT in normal range and within 360 days  ?  ALT  ?Date Value Ref Range Status  ?10/23/2020 16 0 - 32 IU/L Final  ?   ?  ?  Passed - Patient is not pregnant  ?  ?  Passed - Valid encounter within last 12 months  ?  Recent Outpatient Visits   ?      ? 2 months ago Essential hypertension  ? [facility] Glean Hess, MD  ? 5 months ago Internal hemorrhoid, bleeding  ? [facility] Glean Hess, MD  ? 7 months ago Chronic right shoulder pain  ? [facility] Glean Hess, MD  ? 8 months ago Annual physical exam  ? [facility] Glean Hess, MD  ? 9 months ago Anxiety disorder, unspecified type  ? [facility] Glean Hess, MD  ?  ?  ?Future Appointments   ?        ? In 3 months Army Melia Jesse Sans, MD [facility] [place]  ?  ? ?  ?  ?  ? ?

## 2021-07-14 DIAGNOSIS — G4733 Obstructive sleep apnea (adult) (pediatric): Secondary | ICD-10-CM | POA: Diagnosis not present

## 2021-08-14 DIAGNOSIS — G4733 Obstructive sleep apnea (adult) (pediatric): Secondary | ICD-10-CM | POA: Diagnosis not present

## 2021-08-30 ENCOUNTER — Ambulatory Visit (INDEPENDENT_AMBULATORY_CARE_PROVIDER_SITE_OTHER): Payer: Medicare HMO | Admitting: Internal Medicine

## 2021-08-30 ENCOUNTER — Encounter: Payer: Self-pay | Admitting: Internal Medicine

## 2021-08-30 VITALS — BP 134/72 | HR 78 | Ht 67.0 in | Wt 203.4 lb

## 2021-08-30 DIAGNOSIS — M67411 Ganglion, right shoulder: Secondary | ICD-10-CM | POA: Diagnosis not present

## 2021-08-30 NOTE — Patient Instructions (Signed)
[facility]   ?[place]   ?[place], [place] [address]   ?[place], [place], DO   ?[place]   ?[place], [place] [address]   ?[phone] (Work)   ?[phone] (Fax)    ? ?

## 2021-08-30 NOTE — Progress Notes (Signed)
? ? ?Date:  08/30/2021  ? ?Name:  Molly Ruiz   DOB:  Jun 01, 1940   MRN:  [id number] ? ? ?Chief Complaint: Neck Pain ? ?Neck Pain  ?This is a recurrent problem. The current episode started more than 1 month ago. The problem has been waxing and waning. Associated with: Ganglion of Right Shoulder. The quality of the pain is described as aching. The pain is moderate. The symptoms are aggravated by twisting and position. Pertinent negatives include no chest pain, fever, headaches, syncope or weakness. Patient seen Dr. Candelaria Stagers at [facility] for a Ganglion of Right Shoulder. The ganglion was drained by the provider on 01/21/2021. The ganglion has now grown back, and is larger than it was before. It's causing pain in her shoulder, and radiating into her right neck.  ? ?Lab Results  ?Component Value Date  ? NA 137 10/23/2020  ? K 4.6 10/23/2020  ? CO2 26 10/23/2020  ? GLUCOSE 143 (H) 10/23/2020  ? BUN 8 10/23/2020  ? CREATININE 0.98 10/23/2020  ? CALCIUM 9.7 10/23/2020  ? EGFR 59 (L) 10/23/2020  ? GFRNONAA 55 (L) 11/25/2019  ? ?Lab Results  ?Component Value Date  ? CHOL 181 10/23/2020  ? HDL 70 10/23/2020  ? [place] 85 10/23/2020  ? TRIG 155 (H) 10/23/2020  ? CHOLHDL 2.6 10/23/2020  ? ?Lab Results  ?Component Value Date  ? TSH 1.430 10/23/2020  ? ?Lab Results  ?Component Value Date  ? HGBA1C 6.4 (A) 04/26/2021  ? ?Lab Results  ?Component Value Date  ? WBC 5.0 10/23/2020  ? HGB 12.7 10/23/2020  ? HCT 36.8 10/23/2020  ? MCV 91 10/23/2020  ? PLT 225 10/23/2020  ? ?Lab Results  ?Component Value Date  ? ALT 16 10/23/2020  ? AST 16 10/23/2020  ? ALKPHOS 101 10/23/2020  ? BILITOT 0.4 10/23/2020  ? ?Lab Results  ?Component Value Date  ? VD25OH 25.8 (L) 08/17/2017  ?  ? ?Review of Systems  ?Constitutional:  Negative for chills, fatigue and fever.  ?Respiratory:  Negative for chest tightness and shortness of breath.   ?Cardiovascular:  Negative for chest pain, palpitations and syncope.  ?Musculoskeletal:  Positive for  arthralgias (decreased movement in right shoulder) and neck pain.  ?Neurological:  Negative for weakness and headaches.  ? ?Patient Active Problem List  ? Diagnosis Date Noted  ? Acute pain of right shoulder 09/24/2020  ? Myalgia due to statin 03/31/2020  ? Gastroesophageal reflux disease 09/19/2019  ? Special screening for malignant neoplasms, colon   ? Tubular adenoma of colon   ? Anxiety disorder 06/13/2019  ? Osteopenia determined by x-ray 06/06/2018  ? Essential hypertension 04/26/2018  ? Eczema 10/25/2017  ? Slow transit constipation 02/06/2017  ? Primary osteoarthritis of left hip 01/12/2017  ? OSA on CPAP 01/02/2017  ? Type II diabetes mellitus with complication ([place]) 09/73/5329  ? 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 Hyperlipidemia associated with type 2 diabetes mellitus ([place]) 06/24/2016  ? Obesity (BMI 30.0-34.9) 06/24/2016  ? Vitamin D deficiency 06/24/2016  ? History of shingles 06/24/2016  ? Allergic rhinitis 06/24/2016  ? ? ?Allergies  ?Allergen Reactions  ? Atorvastatin Other (See Comments)  ?  Peripheral neuropathy and myalgia  ? Influenza Vaccines Rash  ? ? ?Past Surgical History:  ?Procedure Laterality Date  ? ABDOMINAL HYSTERECTOMY    ? BILATERAL CARPAL TUNNEL RELEASE    ? CATARACT EXTRACTION W/PHACO Right 11/12/2018  ? Procedure: CATARACT EXTRACTION PHACO AND INTRAOCULAR LENS PLACEMENT (  IOC)  RIGHT DIABETIC;  Surgeon: Eulogio Bear, MD;  Location: [place];  Service: Ophthalmology;  Laterality: Right;  Diabetic - diet controlled ?sleep apnea  ? CERVICAL FUSION  2004  ? COLONOSCOPY WITH PROPOFOL N/A 07/22/2019  ? Procedure: COLONOSCOPY WITH BIOPSIES;  Surgeon: Lucilla Lame, MD;  Location: [place];  Service: Endoscopy;  Laterality: N/A;  Diabetic - diet controlled ?priority 4  ? FOOT SURGERY    ? [place] SURGERY  1998  ? POLYPECTOMY N/A 07/22/2019  ? Procedure: POLYPECTOMY;  Surgeon: Lucilla Lame, MD;  Location: [place];  Service:  Endoscopy;  Laterality: N/A;  ? ? ?Social History  ? ?Tobacco Use  ? Smoking status: Never  ? Smokeless tobacco: Never  ?Vaping Use  ? Vaping Use: Never used  ?Substance Use Topics  ? Alcohol use: No  ? Drug use: No  ? ? ? ?Medication list has been reviewed and updated. ? ?Current Meds  ?Medication Sig  ? acetaminophen (TYLENOL) 500 MG tablet Take 2 tablets (1,000 mg total) by mouth 2 (two) times daily.  ? amLODipine (NORVASC) 5 MG tablet Take 1 tablet by mouth once daily  ? Bisacodyl (LAXATIVE PO) Take by mouth. Generic OTC PRN  ? buPROPion (WELLBUTRIN XL) 300 MG 24 hr tablet Take 1 tablet (300 mg total) by mouth daily.  ? CALCIUM CITRATE PO Take 1,200 mg by mouth daily.  ? cholecalciferol (VITAMIN D3) 25 MCG (1000 UNIT) tablet Take 1,000 Units by mouth daily.  ? ezetimibe (ZETIA) 10 MG tablet Take 1 tablet by mouth once daily  ? famotidine (PEPCID) 40 MG tablet Take 1 tablet (40 mg total) by mouth at bedtime.  ? glucose blood (ONETOUCH ULTRA) test strip Test Blood Sugar twice daily.  ? irbesartan (AVAPRO) 300 MG tablet Take 1 tablet (300 mg total) by mouth daily.  ? Lancets 33G MISC 1 each by Does not apply route daily.  ? meloxicam (MOBIC) 15 MG tablet Take 1 tablet (15 mg total) by mouth daily.  ? Multiple Vitamins-Minerals (MULTIVITAMIN ADULTS 50+ PO) Take 1 tablet by mouth daily.  ? NON FORMULARY CPAP @@ 12 cm H20  ? Omega-3 Fatty Acids (FISH OIL) 1000 MG CAPS Take 1 capsule by mouth daily.  ? REFRESH 1.4-0.6 % SOLN Place 1 drop into both eyes as needed.  ? ? ? ?  08/30/2021  ?  4:16 PM 04/26/2021  ?  8:59 AM 02/12/2021  ? 11:10 AM 10/23/2020  ?  9:58 AM  ?GAD 7 : Generalized Anxiety Score  ?Nervous, Anxious, on Edge 0 0 0 1  ?Control/stop worrying 0 0 0 0  ?Worry too much - different things 0 0 0 0  ?Trouble relaxing 0 0 0 0  ?Restless 0 0 0 0  ?Easily annoyed or irritable 0 0 0 0  ?Afraid - awful might happen 0 0 0 0  ?Total GAD 7 Score 0 0 0 1  ?Anxiety Difficulty Not difficult at all Not difficult at all Not  difficult at all Not difficult at all  ? ? ? ?  08/30/2021  ?  4:15 PM  ?Depression screen PHQ 2/9  ?Decreased Interest 0  ?Down, Depressed, Hopeless 0  ?PHQ - 2 Score 0  ?Altered sleeping 1  ?Tired, decreased energy 1  ?Change in appetite 1  ?Feeling bad or failure about yourself  0  ?Trouble concentrating 0  ?Moving slowly or fidgety/restless 0  ?Suicidal thoughts 0  ?PHQ-9 Score 3  ?Difficult doing  work/chores Not difficult at all  ? ? ?BP Readings from Last 3 Encounters:  ?08/30/21 134/72  ?04/26/21 128/80  ?02/12/21 128/64  ? ? ?Physical Exam ?Vitals and nursing note reviewed.  ?Constitutional:   ?   General: She is not in acute distress. ?   Appearance: She is well-developed.  ?HENT:  ?   Head: Normocephalic and atraumatic.  ?Cardiovascular:  ?   Rate and Rhythm: Normal rate and regular rhythm.  ?   Pulses: Normal pulses.  ?Pulmonary:  ?   Effort: Pulmonary effort is normal. No respiratory distress.  ?   Breath sounds: No wheezing or rhonchi.  ?Musculoskeletal:  ?   Right shoulder: Swelling (at the [facility] joint c/w ganglion) present.  ?   Cervical back: Normal range of motion.  ?Lymphadenopathy:  ?   Cervical: No cervical adenopathy.  ?Skin: ?   General: Skin is warm and dry.  ?   Findings: No rash.  ?Neurological:  ?   Mental Status: She is alert and oriented to person, place, and time.  ?Psychiatric:     ?   Mood and Affect: Mood normal.     ?   Behavior: Behavior normal.  ? ? ?Wt Readings from Last 3 Encounters:  ?08/30/21 203 lb 6.4 oz (92.3 kg)  ?04/26/21 203 lb 3.2 oz (92.2 kg)  ?02/12/21 205 lb 9.6 oz (93.3 kg)  ? ? ?BP 134/72   Pulse 78   Ht 5' 7"  (1.702 m)   Wt 203 lb 6.4 oz (92.3 kg)   SpO2 98%   BMI 31.86 kg/m?  ? ?Assessment and Plan: ?1. Ganglion of right shoulder ?Continue Tylenol and limited activities. ?Refer back to SM Dr. Candelaria Stagers at [facility] ?- Ambulatory referral to Orthopedic Surgery ? ? ?Partially dictated using Editor, commissioning. Any errors are unintentional. ? ?[name], MD ?[facility] ?[facility] ? ?08/30/2021 ? ? ? ? ? ?

## 2021-09-07 DIAGNOSIS — M778 Other enthesopathies, not elsewhere classified: Secondary | ICD-10-CM | POA: Diagnosis not present

## 2021-09-07 DIAGNOSIS — M25511 Pain in right shoulder: Secondary | ICD-10-CM | POA: Diagnosis not present

## 2021-09-07 DIAGNOSIS — M19011 Primary osteoarthritis, right shoulder: Secondary | ICD-10-CM | POA: Diagnosis not present

## 2021-09-07 DIAGNOSIS — M67411 Ganglion, right shoulder: Secondary | ICD-10-CM | POA: Diagnosis not present

## 2021-09-07 DIAGNOSIS — M67911 Unspecified disorder of synovium and tendon, right shoulder: Secondary | ICD-10-CM | POA: Diagnosis not present

## 2021-09-07 DIAGNOSIS — M75111 Incomplete rotator cuff tear or rupture of right shoulder, not specified as traumatic: Secondary | ICD-10-CM | POA: Diagnosis not present

## 2021-09-07 DIAGNOSIS — M7551 Bursitis of right shoulder: Secondary | ICD-10-CM | POA: Diagnosis not present

## 2021-09-07 DIAGNOSIS — M7541 Impingement syndrome of right shoulder: Secondary | ICD-10-CM | POA: Diagnosis not present

## 2021-09-07 DIAGNOSIS — G8929 Other chronic pain: Secondary | ICD-10-CM | POA: Diagnosis not present

## 2021-09-15 ENCOUNTER — Encounter: Payer: Self-pay | Admitting: Internal Medicine

## 2021-09-15 ENCOUNTER — Ambulatory Visit (INDEPENDENT_AMBULATORY_CARE_PROVIDER_SITE_OTHER): Payer: Medicare HMO | Admitting: Internal Medicine

## 2021-09-15 VITALS — BP 136/72 | HR 70 | Temp 98.4°F | Ht 67.0 in | Wt 200.0 lb

## 2021-09-15 DIAGNOSIS — E118 Type 2 diabetes mellitus with unspecified complications: Secondary | ICD-10-CM | POA: Diagnosis not present

## 2021-09-15 DIAGNOSIS — J40 Bronchitis, not specified as acute or chronic: Secondary | ICD-10-CM

## 2021-09-15 MED ORDER — ONETOUCH ULTRA VI STRP: ORAL_STRIP | 12 refills | Status: DC

## 2021-09-15 MED ORDER — AZITHROMYCIN 250 MG PO TABS
ORAL_TABLET | ORAL | 0 refills | Status: AC
Start: 2021-09-15 — End: 2021-09-20

## 2021-09-15 NOTE — Progress Notes (Signed)
? ? ?Date:  09/15/2021  ? ?Name:  Molly Ruiz   DOB:  01-Feb-1941   MRN:  [id number] ? ? ?Chief Complaint: Cough ? ?Cough ?This is a new problem. Episode onset: 3 days. The problem has been gradually worsening. The cough is Productive of sputum (Yellow in Color). Associated symptoms include ear pain (Left Side), myalgias, nasal congestion and a sore throat. Pertinent negatives include no chest pain, chills, fever, headaches, shortness of breath or wheezing.  ? ?Lab Results  ?Component Value Date  ? NA 137 10/23/2020  ? K 4.6 10/23/2020  ? CO2 26 10/23/2020  ? GLUCOSE 143 (H) 10/23/2020  ? BUN 8 10/23/2020  ? CREATININE 0.98 10/23/2020  ? CALCIUM 9.7 10/23/2020  ? EGFR 59 (L) 10/23/2020  ? GFRNONAA 55 (L) 11/25/2019  ? ?Lab Results  ?Component Value Date  ? CHOL 181 10/23/2020  ? HDL 70 10/23/2020  ? [place] 85 10/23/2020  ? TRIG 155 (H) 10/23/2020  ? CHOLHDL 2.6 10/23/2020  ? ?Lab Results  ?Component Value Date  ? TSH 1.430 10/23/2020  ? ?Lab Results  ?Component Value Date  ? HGBA1C 6.4 (A) 04/26/2021  ? ?Lab Results  ?Component Value Date  ? WBC 5.0 10/23/2020  ? HGB 12.7 10/23/2020  ? HCT 36.8 10/23/2020  ? MCV 91 10/23/2020  ? PLT 225 10/23/2020  ? ?Lab Results  ?Component Value Date  ? ALT 16 10/23/2020  ? AST 16 10/23/2020  ? ALKPHOS 101 10/23/2020  ? BILITOT 0.4 10/23/2020  ? ?Lab Results  ?Component Value Date  ? VD25OH 25.8 (L) 08/17/2017  ?  ? ?Review of Systems  ?Constitutional:  Negative for chills, fatigue and fever.  ?HENT:  Positive for ear pain (Left Side) and sore throat. Negative for trouble swallowing.   ?Respiratory:  Positive for cough. Negative for shortness of breath and wheezing.   ?Cardiovascular:  Negative for chest pain and palpitations.  ?Musculoskeletal:  Positive for myalgias.  ?Neurological:  Negative for dizziness, light-headedness and headaches.  ? ?Patient Active Problem List  ? Diagnosis Date Noted  ? Acute pain of right shoulder 09/24/2020  ? Myalgia due to statin 03/31/2020  ?  Gastroesophageal reflux disease 09/19/2019  ? Special screening for malignant neoplasms, colon   ? Tubular adenoma of colon   ? Anxiety disorder 06/13/2019  ? Osteopenia determined by x-ray 06/06/2018  ? Essential hypertension 04/26/2018  ? Eczema 10/25/2017  ? Slow transit constipation 02/06/2017  ? Primary osteoarthritis of left hip 01/12/2017  ? OSA on CPAP 01/02/2017  ? Type II diabetes mellitus with complication ([place]) 70/17/7939  ? 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 Hyperlipidemia associated with type 2 diabetes mellitus ([place]) 06/24/2016  ? Obesity (BMI 30.0-34.9) 06/24/2016  ? Vitamin D deficiency 06/24/2016  ? History of shingles 06/24/2016  ? Allergic rhinitis 06/24/2016  ? ? ?Allergies  ?Allergen Reactions  ? Atorvastatin Other (See Comments)  ?  Peripheral neuropathy and myalgia  ? Influenza Vaccines Rash  ? ? ?Past Surgical History:  ?Procedure Laterality Date  ? ABDOMINAL HYSTERECTOMY    ? BILATERAL CARPAL TUNNEL RELEASE    ? CATARACT EXTRACTION W/PHACO Right 11/12/2018  ? Procedure: CATARACT EXTRACTION PHACO AND INTRAOCULAR LENS PLACEMENT ([place])  RIGHT DIABETIC;  Surgeon: Eulogio Bear, MD;  Location: [place];  Service: Ophthalmology;  Laterality: Right;  Diabetic - diet controlled ?sleep apnea  ? CERVICAL FUSION  2004  ? COLONOSCOPY WITH PROPOFOL N/A 07/22/2019  ? Procedure: COLONOSCOPY WITH  BIOPSIES;  Surgeon: Lucilla Lame, MD;  Location: [place];  Service: Endoscopy;  Laterality: N/A;  Diabetic - diet controlled ?priority 4  ? FOOT SURGERY    ? [place] SURGERY  1998  ? POLYPECTOMY N/A 07/22/2019  ? Procedure: POLYPECTOMY;  Surgeon: Lucilla Lame, MD;  Location: [place];  Service: Endoscopy;  Laterality: N/A;  ? ? ?Social History  ? ?Tobacco Use  ? Smoking status: Never  ? Smokeless tobacco: Never  ?Vaping Use  ? Vaping Use: Never used  ?Substance Use Topics  ? Alcohol use: No  ? Drug use: No  ? ? ? ?Medication list has been reviewed  and updated. ? ?Current Meds  ?Medication Sig  ? acetaminophen (TYLENOL) 500 MG tablet Take 2 tablets (1,000 mg total) by mouth 2 (two) times daily.  ? amLODipine (NORVASC) 5 MG tablet Take 1 tablet by mouth once daily  ? Bisacodyl (LAXATIVE PO) Take by mouth. Generic OTC PRN  ? buPROPion (WELLBUTRIN XL) 300 MG 24 hr tablet Take 1 tablet (300 mg total) by mouth daily.  ? CALCIUM CITRATE PO Take 1,200 mg by mouth daily.  ? cholecalciferol (VITAMIN D3) 25 MCG (1000 UNIT) tablet Take 1,000 Units by mouth daily.  ? ezetimibe (ZETIA) 10 MG tablet Take 1 tablet by mouth once daily  ? famotidine (PEPCID) 40 MG tablet Take 1 tablet (40 mg total) by mouth at bedtime.  ? glucose blood (ONETOUCH ULTRA) test strip Test Blood Sugar twice daily.  ? irbesartan (AVAPRO) 300 MG tablet Take 1 tablet (300 mg total) by mouth daily.  ? Lancets 33G MISC 1 each by Does not apply route daily.  ? meloxicam (MOBIC) 15 MG tablet Take 1 tablet (15 mg total) by mouth daily.  ? Multiple Vitamins-Minerals (MULTIVITAMIN ADULTS 50+ PO) Take 1 tablet by mouth daily.  ? NON FORMULARY CPAP @@ 12 cm H20  ? Omega-3 Fatty Acids (FISH OIL) 1000 MG CAPS Take 1 capsule by mouth daily.  ? REFRESH 1.4-0.6 % SOLN Place 1 drop into both eyes as needed.  ? [DISCONTINUED] acetaminophen (TYLENOL) 500 MG tablet Take by mouth.  ? [DISCONTINUED] amLODipine (NORVASC) 5 MG tablet Take 1 tablet by mouth daily.  ? [DISCONTINUED] Cholecalciferol 50 MCG (2000 UT) CAPS Take by mouth.  ? [DISCONTINUED] famotidine (PEPCID) 40 MG tablet Take by mouth.  ? ? ? ?  08/30/2021  ?  4:16 PM 04/26/2021  ?  8:59 AM 02/12/2021  ? 11:10 AM 10/23/2020  ?  9:58 AM  ?GAD 7 : Generalized Anxiety Score  ?Nervous, Anxious, on Edge 0 0 0 1  ?Control/stop worrying 0 0 0 0  ?Worry too much - different things 0 0 0 0  ?Trouble relaxing 0 0 0 0  ?Restless 0 0 0 0  ?Easily annoyed or irritable 0 0 0 0  ?Afraid - awful might happen 0 0 0 0  ?Total GAD 7 Score 0 0 0 1  ?Anxiety Difficulty Not difficult  at all Not difficult at all Not difficult at all Not difficult at all  ? ? ? ?  08/30/2021  ?  4:15 PM  ?Depression screen PHQ 2/9  ?Decreased Interest 0  ?Down, Depressed, Hopeless 0  ?PHQ - 2 Score 0  ?Altered sleeping 1  ?Tired, decreased energy 1  ?Change in appetite 1  ?Feeling bad or failure about yourself  0  ?Trouble concentrating 0  ?Moving slowly or fidgety/restless 0  ?Suicidal thoughts 0  ?PHQ-9 Score 3  ?  Difficult doing work/chores Not difficult at all  ? ? ?BP Readings from Last 3 Encounters:  ?09/15/21 136/72  ?08/30/21 134/72  ?04/26/21 128/80  ? ? ?Physical Exam ?Vitals and nursing note reviewed.  ?Constitutional:   ?   General: She is not in acute distress. ?   Appearance: Normal appearance. She is well-developed.  ?HENT:  ?   Head: Normocephalic and atraumatic.  ?   Right Ear: Tympanic membrane and ear canal normal.  ?   Left Ear: Tympanic membrane and ear canal normal.  ?   Nose:  ?   Right Sinus: No maxillary sinus tenderness or frontal sinus tenderness.  ?   Left Sinus: No maxillary sinus tenderness or frontal sinus tenderness.  ?   Mouth/Throat:  ?   Pharynx: No oropharyngeal exudate or posterior oropharyngeal erythema.  ?Cardiovascular:  ?   Rate and Rhythm: Normal rate and regular rhythm.  ?   Heart sounds: Normal heart sounds.  ?Pulmonary:  ?   Effort: Pulmonary effort is normal. No respiratory distress.  ?   Breath sounds: Normal breath sounds. No decreased breath sounds or wheezing.  ?Skin: ?   General: Skin is warm and dry.  ?   Findings: No rash.  ?Neurological:  ?   Mental Status: She is alert and oriented to person, place, and time.  ?Psychiatric:     ?   Mood and Affect: Mood normal.     ?   Behavior: Behavior normal.  ? ? ?Wt Readings from Last 3 Encounters:  ?09/15/21 200 lb (90.7 kg)  ?08/30/21 203 lb 6.4 oz (92.3 kg)  ?04/26/21 203 lb 3.2 oz (92.2 kg)  ? ? ?BP 136/72   Pulse 70   Temp 98.4 ?F (36.9 ?C) (Oral)   Ht 5' 7"  (1.702 m)   Wt 200 lb (90.7 kg)   SpO2 98%   BMI 31.32  kg/m?  ? ?Assessment and Plan: ?1. Bronchitis ?Continue otc cough syrup as needed ?Push fluids and follow up if needed ?- azithromycin (ZITHROMAX Z-PAK) 250 MG tablet; UAD  Dispense: 6 each; Refill: 0 ? ?2. Ty

## 2021-09-30 ENCOUNTER — Other Ambulatory Visit: Payer: Self-pay | Admitting: Internal Medicine

## 2021-09-30 DIAGNOSIS — F419 Anxiety disorder, unspecified: Secondary | ICD-10-CM

## 2021-09-30 DIAGNOSIS — E1169 Type 2 diabetes mellitus with other specified complication: Secondary | ICD-10-CM

## 2021-09-30 DIAGNOSIS — I10 Essential (primary) hypertension: Secondary | ICD-10-CM

## 2021-10-05 NOTE — Telephone Encounter (Signed)
Requested Prescriptions  Pending Prescriptions Disp Refills  . ezetimibe (ZETIA) 10 MG tablet [Pharmacy Med Name: Ezetimibe 10 MG Oral Tablet] 90 tablet 0    Sig: Take 1 tablet by mouth once daily     Cardiovascular:  Antilipid - Sterol Transport Inhibitors Failed - 09/30/2021  6:18 PM      Failed - Lipid Panel in normal range within the last 12 months    Cholesterol, Total  Date Value Ref Range Status  10/23/2020 181 100 - 199 mg/dL Final   LDL Chol Calc (NIH)  Date Value Ref Range Status  10/23/2020 85 0 - 99 mg/dL Final   HDL  Date Value Ref Range Status  10/23/2020 70 >39 mg/dL Final   Triglycerides  Date Value Ref Range Status  10/23/2020 155 (H) 0 - 149 mg/dL Final         Passed - AST in normal range and within 360 days    AST  Date Value Ref Range Status  10/23/2020 16 0 - 40 IU/L Final         Passed - ALT in normal range and within 360 days    ALT  Date Value Ref Range Status  10/23/2020 16 0 - 32 IU/L Final         Passed - Patient is not pregnant      Passed - Valid encounter within last 12 months    Recent Outpatient Visits          2 weeks ago [facility] Glean Hess, MD   1 month ago Ganglion of right shoulder   [facility] Glean Hess, MD   5 months ago Essential hypertension   [facility] Glean Hess, MD   7 months ago Internal hemorrhoid, bleeding   [facility] Glean Hess, MD   10 months ago Chronic right shoulder pain   [facility] Glean Hess, MD      Future Appointments            In 4 weeks Glean Hess, MD [facility]           . amLODipine ([place]) 5 MG tablet [Pharmacy Med Name: amLODIPine Besylate 5 MG Oral Tablet] 90 tablet 0    Sig: Take 1 tablet by mouth once daily     Cardiovascular: Calcium Channel Blockers 2 Passed - 09/30/2021  6:18 PM      Passed - Last BP in normal range    BP Readings from Last 1  Encounters:  09/15/21 136/72         Passed - Last Heart Rate in normal range    Pulse Readings from Last 1 Encounters:  09/15/21 70         Passed - Valid encounter within last 6 months    Recent Outpatient Visits          2 weeks ago [facility] Glean Hess, MD   1 month ago Ganglion of right shoulder   [facility] Glean Hess, MD   5 months ago Essential hypertension   [place], MD   7 months ago Internal hemorrhoid, bleeding   [facility] Glean Hess, MD   10 months ago Chronic right shoulder pain   [facility] Glean Hess, MD      Future Appointments  In 4 weeks Glean Hess, MD [facility]           . buPROPion (WELLBUTRIN XL) 300 MG 24 hr tablet [Pharmacy Med Name: buPROPion HCl ER (XL) 300 MG Oral Tablet Extended Release 24 Hour] 90 tablet 0    Sig: Take 1 tablet by mouth once daily     Psychiatry: Antidepressants - bupropion Passed - 09/30/2021  6:18 PM      Passed - Cr in normal range and within 360 days    Creatinine, Ser  Date Value Ref Range Status  10/23/2020 0.98 0.57 - 1.00 mg/dL Final         Passed - AST in normal range and within 360 days    AST  Date Value Ref Range Status  10/23/2020 16 0 - 40 IU/L Final         Passed - ALT in normal range and within 360 days    ALT  Date Value Ref Range Status  10/23/2020 16 0 - 32 IU/L Final         Passed - Last BP in normal range    BP Readings from Last 1 Encounters:  09/15/21 136/72         Passed - Valid encounter within last 6 months    Recent Outpatient Visits          2 weeks ago [facility] Glean Hess, MD   1 month ago Ganglion of right shoulder   [facility] Glean Hess, MD   5 months ago Essential hypertension   [place], MD   7 months ago Internal hemorrhoid, bleeding    [facility] Glean Hess, MD   10 months ago Chronic right shoulder pain   [facility] Glean Hess, MD      Future Appointments            In 4 weeks Army Melia Jesse Sans, MD [facility] [place]           . irbesartan (AVAPRO) 300 MG tablet [Pharmacy Med Name: Irbesartan 300 MG Oral Tablet] 90 tablet 0    Sig: Take 1 tablet by mouth once daily     Cardiovascular:  Angiotensin Receptor Blockers Failed - 09/30/2021  6:18 PM      Failed - Cr in normal range and within 180 days    Creatinine, Ser  Date Value Ref Range Status  10/23/2020 0.98 0.57 - 1.00 mg/dL Final         Failed - K in normal range and within 180 days    Potassium  Date Value Ref Range Status  10/23/2020 4.6 3.5 - 5.2 mmol/L Final         Passed - Patient is not pregnant      Passed - Last BP in normal range    BP Readings from Last 1 Encounters:  09/15/21 136/72         Passed - Valid encounter within last 6 months    Recent Outpatient Visits          2 weeks ago [facility] Glean Hess, MD   1 month ago Ganglion of right shoulder   [facility] Glean Hess, MD   5 months ago Essential hypertension   [place], Laura H, MD   7 months ago Internal hemorrhoid, bleeding   [facility] Glean Hess, MD   10 months ago Chronic right shoulder pain   [facility] Glean Hess, MD      Future Appointments            In 4 weeks Army Melia Jesse Sans, MD [facility]

## 2021-10-08 DIAGNOSIS — G4733 Obstructive sleep apnea (adult) (pediatric): Secondary | ICD-10-CM | POA: Diagnosis not present

## 2021-10-13 ENCOUNTER — Ambulatory Visit: Payer: Self-pay

## 2021-10-13 ENCOUNTER — Ambulatory Visit (INDEPENDENT_AMBULATORY_CARE_PROVIDER_SITE_OTHER): Payer: Medicare HMO | Admitting: Internal Medicine

## 2021-10-13 ENCOUNTER — Telehealth: Payer: Self-pay

## 2021-10-13 ENCOUNTER — Encounter: Payer: Self-pay | Admitting: Internal Medicine

## 2021-10-13 VITALS — BP 132/62 | HR 80 | Ht 67.0 in | Wt 202.0 lb

## 2021-10-13 DIAGNOSIS — R69 Illness, unspecified: Secondary | ICD-10-CM | POA: Diagnosis not present

## 2021-10-13 DIAGNOSIS — F419 Anxiety disorder, unspecified: Secondary | ICD-10-CM

## 2021-10-13 DIAGNOSIS — I10 Essential (primary) hypertension: Secondary | ICD-10-CM

## 2021-10-13 DIAGNOSIS — L03011 Cellulitis of right finger: Secondary | ICD-10-CM

## 2021-10-13 MED ORDER — CEPHALEXIN 500 MG PO CAPS: 500.0000 mg | ORAL_CAPSULE | Freq: Four times a day (QID) | ORAL | 0 refills | Status: AC

## 2021-10-13 MED ORDER — BUPROPION HCL ER (XL) 300 MG PO TB24: 300.0000 mg | ORAL_TABLET | Freq: Every day | ORAL | 1 refills | Status: DC

## 2021-10-13 MED ORDER — IRBESARTAN 300 MG PO TABS: 300.0000 mg | ORAL_TABLET | Freq: Every day | ORAL | 1 refills | Status: DC

## 2021-10-13 NOTE — Progress Notes (Signed)
Date:  10/13/2021   Name:  Molly Ruiz   DOB:  31-Dec-1940   MRN:  [id number]   Chief Complaint: Hand Pain (Right Thumb- Painful X 4 days. White spot under nail. The joints were aching at first and now the pain is just under the nail.)  Hand Pain  The incident occurred 5 to 7 days ago. Incident location: after a manicure. The injury mechanism is unknown. The pain is present in the right fingers. The quality of the pain is described as aching. The pain does not radiate. The pain is mild. The pain has been Fluctuating since the incident. Pertinent negatives include no chest pain. The symptoms are aggravated by movement (and pressure on the nail of the right thumb). She has tried acetaminophen for the symptoms.   Lab Results  Component Value Date   NA 137 10/23/2020   K 4.6 10/23/2020   CO2 26 10/23/2020   GLUCOSE 143 (H) 10/23/2020   BUN 8 10/23/2020   CREATININE 0.98 10/23/2020   CALCIUM 9.7 10/23/2020   EGFR 59 (L) 10/23/2020   GFRNONAA 55 (L) 11/25/2019   Lab Results  Component Value Date   CHOL 181 10/23/2020   HDL 70 10/23/2020   LDLCALC 85 10/23/2020   TRIG 155 (H) 10/23/2020   CHOLHDL 2.6 10/23/2020   Lab Results  Component Value Date   TSH 1.430 10/23/2020   Lab Results  Component Value Date   HGBA1C 6.4 (A) 04/26/2021   Lab Results  Component Value Date   WBC 5.0 10/23/2020   HGB 12.7 10/23/2020   HCT 36.8 10/23/2020   MCV 91 10/23/2020   PLT 225 10/23/2020   Lab Results  Component Value Date   ALT 16 10/23/2020   AST 16 10/23/2020   ALKPHOS 101 10/23/2020   BILITOT 0.4 10/23/2020   Lab Results  Component Value Date   VD25OH 25.8 (L) 08/17/2017     Review of Systems  Constitutional:  Negative for chills and fever.  Respiratory:  Negative for chest tightness and shortness of breath.   Cardiovascular:  Negative for chest pain.  Musculoskeletal:  Positive for arthralgias and joint swelling (distal right thumb).   Patient Active Problem List    Diagnosis Date Noted   Acute pain of right shoulder 09/24/2020   Myalgia due to statin 03/31/2020   Gastroesophageal reflux disease 09/19/2019   Special screening for malignant neoplasms, colon    Tubular adenoma of colon    Anxiety disorder 06/13/2019   Osteopenia determined by x-ray 06/06/2018   Essential hypertension 04/26/2018   Eczema 10/25/2017   Slow transit constipation 02/06/2017   Primary osteoarthritis of left hip 01/12/2017   OSA on CPAP 01/02/2017   Type II diabetes mellitus with complication ([place]) 74/25/9563   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   Hyperlipidemia associated with type 2 diabetes mellitus ([place]) 06/24/2016   Obesity (BMI 30.0-34.9) 06/24/2016   Vitamin D deficiency 06/24/2016   History of shingles 06/24/2016   Allergic rhinitis 06/24/2016    Allergies  Allergen Reactions   Atorvastatin Other (See Comments)    Peripheral neuropathy and myalgia   Influenza Vaccines Rash    Past Surgical History:  Procedure Laterality Date   ABDOMINAL HYSTERECTOMY     BILATERAL CARPAL TUNNEL RELEASE     CATARACT EXTRACTION W/PHACO Right 11/12/2018   Procedure: CATARACT EXTRACTION PHACO AND INTRAOCULAR LENS PLACEMENT (IOC)  RIGHT DIABETIC;  Surgeon: Eulogio Bear, MD;  Location: [facility];  Service: Ophthalmology;  Laterality: Right;  Diabetic - diet controlled sleep apnea   CERVICAL FUSION  2004   COLONOSCOPY WITH PROPOFOL N/A 07/22/2019   Procedure: COLONOSCOPY WITH BIOPSIES;  Surgeon: Lucilla Lame, MD;  Location: [facility];  Service: Endoscopy;  Laterality: N/A;  Diabetic - diet controlled priority [place]   POLYPECTOMY N/A 07/22/2019   Procedure: POLYPECTOMY;  Surgeon: Lucilla Lame, MD;  Location: [facility];  Service: Endoscopy;  Laterality: N/A;    Social History   Tobacco Use   Smoking status: Never   Smokeless tobacco: Never  Vaping Use   Vaping Use: Never  used  Substance Use Topics   Alcohol use: No   Drug use: No     Medication list has been reviewed and updated.  No outpatient medications have been marked as taking for the 10/13/21 encounter (Office Visit) with Glean Hess, MD.       09/15/2021    8:50 AM 08/30/2021    4:16 PM 04/26/2021    8:59 AM 02/12/2021   11:10 AM  GAD 7 : Generalized Anxiety Score  Nervous, Anxious, on Edge 0 0 0 0  Control/stop worrying 0 0 0 0  Worry too much - different things 0 0 0 0  Trouble relaxing 0 0 0 0  Restless 0 0 0 0  Easily annoyed or irritable 0 0 0 0  Afraid - awful might happen 0 0 0 0  Total GAD 7 Score 0 0 0 0  Anxiety Difficulty Not difficult at all Not difficult at all Not difficult at all Not difficult at all       09/15/2021    8:49 AM  Depression screen PHQ 2/9  Decreased Interest 0  Down, Depressed, Hopeless 0  PHQ - 2 Score 0  Altered sleeping 1  Tired, decreased energy 3  Change in appetite 1  Feeling bad or failure about yourself  0  Trouble concentrating 0  Moving slowly or fidgety/restless 0  Suicidal thoughts 0  PHQ-9 Score 5  Difficult doing work/chores Not difficult at all    BP Readings from Last 3 Encounters:  10/13/21 132/62  09/15/21 136/72  08/30/21 134/72    Physical Exam Vitals and nursing note reviewed.  Constitutional:      General: She is not in acute distress.    Appearance: She is well-developed.  HENT:     Head: Normocephalic and atraumatic.  Pulmonary:     Effort: Pulmonary effort is normal. No respiratory distress.  Musculoskeletal:     Right hand: Swelling (base of right thumb nail.  tender but no drainage; cystic mass over the DIP joint) and tenderness present.  Skin:    General: Skin is warm and dry.     Findings: No rash.  Neurological:     Mental Status: She is alert and oriented to person, place, and time.  Psychiatric:        Mood and Affect: Mood normal.        Behavior: Behavior normal.    Wt Readings from Last  3 Encounters:  10/13/21 202 lb (91.6 kg)  09/15/21 200 lb (90.7 kg)  08/30/21 203 lb 6.4 oz (92.3 kg)    BP 132/62   Pulse 80   Ht 5' 7"  (1.702 m)   Wt 202 lb (91.6 kg)   SpO2 98%   BMI 31.64 kg/m   Assessment and Plan: 1. Cellulitis  of finger of right hand Suspect infection introduced at time of manicure Will treat with antibiotics then re-evaluate next visit Consider Ortho hand referral for cyst if persistent - cephALEXin (KEFLEX) 500 MG capsule; Take 1 capsule (500 mg total) by mouth 4 (four) times daily for 7 days.  Dispense: 28 capsule; Refill: 0  2. Essential hypertension Clinically stable exam with well controlled BP. Tolerating medications without side effects at this time. Pt to continue current regimen and low sodium diet; benefits of regular exercise as able discussed. - irbesartan (AVAPRO) 300 MG tablet; Take 1 tablet (300 mg total) by mouth daily.  Dispense: 90 tablet; Refill: 1  3. Anxiety disorder, unspecified type Doing well - will continue current therapy. - buPROPion (WELLBUTRIN XL) 300 MG 24 hr tablet; Take 1 tablet (300 mg total) by mouth daily.  Dispense: 90 tablet; Refill: 1   Partially dictated using Editor, commissioning. Any errors are unintentional.  [name], MD [facility]  10/13/2021

## 2021-10-13 NOTE — Telephone Encounter (Signed)
Called and left VM informing pt that she is scheduled today for a virtual visit for her thumb pain, however we need to see this in PERSON to evaluate the problem.  Left VM informing pt of this information. Told her to call back to confirm that she received this msg. PEC may give info.

## 2021-10-13 NOTE — Telephone Encounter (Signed)
   Chief Complaint: Right thumb pain, swelling, white spot on nail. Could not sleep last night. Symptoms: Above Frequency: 10 days ago Pertinent Negatives: Patient denies fever Disposition: '[]'$ ED /'[]'$ Urgent Care (no appt availability in office) / '[x]'$ Appointment(In office/virtual)/ '[]'$  [place] [facility]/ '[]'$ Home Care/ '[]'$ Refused Recommended Disposition /'[]'$ [place] [facility]/ '[]'$  Follow-up with PCP Additional Notes:   Reason for Disposition  [1] MODERATE pain (e.g., interferes with normal activities) AND [2] present > 3 days  Answer Assessment - Initial Assessment Questions 1. ONSET: "When did the pain start?"      10 days ago 2. LOCATION and RADIATION: "Where is the pain located?"  (e.g., fingertip, around nail, joint, entire  finger)      Right thumb 3. SEVERITY: "How bad is the pain?" "What does it keep you from doing?"   (Scale 1-10; or mild, moderate, severe)  - MILD (1-3): doesn't interfere with normal activities.   - MODERATE (4-7): interferes with normal activities or awakens from sleep.  - SEVERE (8-10): excruciating pain, unable to hold a glass of water or bend finger even a little.     Now - 4 4. APPEARANCE: "What does the finger look like?" (e.g., redness, swelling, bruising, pallor)     White spot on nail, swelling 5. WORK OR EXERCISE: "Has there been any recent work or exercise that involved this part (i.e., fingers or hand) of the body?"     No 6. CAUSE: "What do you think is causing the pain?"     Unsure 7. AGGRAVATING FACTORS: "What makes the pain worse?" (e.g., using computer)     Unsure 8. OTHER SYMPTOMS: "Do you have any other symptoms?" (e.g., fever, neck pain, numbness)     No 9. PREGNANCY: "Is there any chance you are pregnant?" "When was your last menstrual period?"     No  Protocols used: Finger Pain-A-AH

## 2021-11-02 ENCOUNTER — Ambulatory Visit (INDEPENDENT_AMBULATORY_CARE_PROVIDER_SITE_OTHER): Payer: Medicare HMO | Admitting: Internal Medicine

## 2021-11-02 ENCOUNTER — Encounter: Payer: Self-pay | Admitting: Internal Medicine

## 2021-11-02 VITALS — BP 134/68 | HR 75 | Ht 67.0 in | Wt 203.0 lb

## 2021-11-02 DIAGNOSIS — E118 Type 2 diabetes mellitus with unspecified complications: Secondary | ICD-10-CM

## 2021-11-02 DIAGNOSIS — K219 Gastro-esophageal reflux disease without esophagitis: Secondary | ICD-10-CM | POA: Diagnosis not present

## 2021-11-02 DIAGNOSIS — E1169 Type 2 diabetes mellitus with other specified complication: Secondary | ICD-10-CM

## 2021-11-02 DIAGNOSIS — E785 Hyperlipidemia, unspecified: Secondary | ICD-10-CM | POA: Diagnosis not present

## 2021-11-02 DIAGNOSIS — Z9989 Dependence on other enabling machines and devices: Secondary | ICD-10-CM

## 2021-11-02 DIAGNOSIS — Z0000 Encounter for general adult medical examination without abnormal findings: Secondary | ICD-10-CM | POA: Diagnosis not present

## 2021-11-02 DIAGNOSIS — E559 Vitamin D deficiency, unspecified: Secondary | ICD-10-CM | POA: Diagnosis not present

## 2021-11-02 DIAGNOSIS — I10 Essential (primary) hypertension: Secondary | ICD-10-CM | POA: Diagnosis not present

## 2021-11-02 DIAGNOSIS — G4733 Obstructive sleep apnea (adult) (pediatric): Secondary | ICD-10-CM

## 2021-11-02 NOTE — Progress Notes (Signed)
Date:  11/02/2021   Name:  Molly Ruiz   DOB:  1940-07-14   MRN:  [id number]   Chief Complaint: Annual Exam (Breast Exam, Foot Exam. MICRO at lab.) Molly Ruiz is a 82 y.o. female who presents today for her Complete Annual Exam. She feels fairly well. She reports exercising some. She reports she is sleeping fairly well. Breast complaints - none.  Mammogram: 06/2021 DEXA: 05/2020 Pap smear: discontinued Colonoscopy: 07/2019 2 tubular adenomas  Health Maintenance Due  Topic Date Due   HEMOGLOBIN A1C  10/25/2021   OPHTHALMOLOGY EXAM  10/27/2021    Immunization History  Administered Date(s) Administered   PFIZER(Purple Top)SARS-COV-2 Vaccination 07/04/2019, 07/30/2019, 03/21/2020   Pfizer Covid-19 Vaccine Bivalent Booster 54yrs & up 10/25/2020   Pfizer Covid-19 Vaccine Bivalent Booster 5y-11y 10/29/2021   Pneumococcal Conjugate-13 06/24/2016   Pneumococcal Polysaccharide-23 08/23/2017   Tdap 06/24/2016   Zoster Recombinat (Shingrix) 10/29/2021    Hypertension This is a chronic problem. The problem is controlled. Pertinent negatives include no chest pain, headaches, palpitations or shortness of breath. Past treatments include calcium channel blockers and angiotensin blockers. The current treatment provides significant improvement. Hypertensive end-organ damage includes kidney disease. There is no history of CAD/MI or CVA.  Hyperlipidemia This is a chronic problem. The problem is controlled. Pertinent negatives include no chest pain or shortness of breath. Current antihyperlipidemic treatment includes herbal therapy and ezetimibe. The current treatment provides mild improvement of lipids.  Diabetes She presents for her follow-up diabetic visit. She has type 2 diabetes mellitus. Her disease course has been stable. Pertinent negatives for hypoglycemia include no dizziness, headaches, nervousness/anxiousness or tremors. Pertinent negatives for diabetes include no chest pain, no  fatigue, no polydipsia and no polyuria. Pertinent negatives for diabetic complications include no CVA. Current diabetic treatment includes diet. She is compliant with treatment most of the time. An ACE inhibitor/angiotensin II receptor blocker is being taken. Eye exam is not current.  Gastroesophageal Reflux She complains of heartburn. She reports no abdominal pain, no chest pain, no coughing or no wheezing. This is a recurrent problem. The problem occurs occasionally. Pertinent negatives include no fatigue. She has tried a histamine-2 antagonist for the symptoms.  OSA - on CPAP. Using it nightly and sleeps well.  Lab Results  Component Value Date   NA 137 10/23/2020   K 4.6 10/23/2020   CO2 26 10/23/2020   GLUCOSE 143 (H) 10/23/2020   BUN 8 10/23/2020   CREATININE 0.98 10/23/2020   CALCIUM 9.7 10/23/2020   EGFR 59 (L) 10/23/2020   GFRNONAA 55 (L) 11/25/2019   Lab Results  Component Value Date   CHOL 181 10/23/2020   HDL 70 10/23/2020   LDLCALC 85 10/23/2020   TRIG 155 (H) 10/23/2020   CHOLHDL 2.6 10/23/2020   Lab Results  Component Value Date   TSH 1.430 10/23/2020   Lab Results  Component Value Date   HGBA1C 6.4 (A) 04/26/2021   Lab Results  Component Value Date   WBC 5.0 10/23/2020   HGB 12.7 10/23/2020   HCT 36.8 10/23/2020   MCV 91 10/23/2020   PLT 225 10/23/2020   Lab Results  Component Value Date   ALT 16 10/23/2020   AST 16 10/23/2020   ALKPHOS 101 10/23/2020   BILITOT 0.4 10/23/2020   Lab Results  Component Value Date   VD25OH 25.8 (L) 08/17/2017     Review of Systems  Constitutional:  Negative for chills, fatigue and fever.  HENT:  Negative  for congestion, hearing loss, tinnitus, trouble swallowing and voice change.   Eyes:  Negative for visual disturbance.  Respiratory:  Negative for cough, chest tightness, shortness of breath and wheezing.   Cardiovascular:  Negative for chest pain, palpitations and leg swelling.  Gastrointestinal:  Positive for  heartburn. Negative for abdominal pain, constipation, diarrhea and vomiting.  Endocrine: Negative for polydipsia and polyuria.  Genitourinary:  Negative for dysuria, frequency, genital sores, vaginal bleeding and vaginal discharge.  Musculoskeletal:  Positive for arthralgias (hands/thumbs). Negative for gait problem and joint swelling.  Skin:  Negative for color change and rash.  Neurological:  Negative for dizziness, tremors, light-headedness and headaches.  Hematological:  Negative for adenopathy. Does not bruise/bleed easily.  Psychiatric/Behavioral:  Negative for dysphoric mood and sleep disturbance. The patient is not nervous/anxious.     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/2017   Primary osteoarthritis of left hip 01/12/2017   OSA on CPAP 01/02/2017   Type II diabetes mellitus with complication (HCC) 06/24/2016   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   Hyperlipidemia associated with type 2 diabetes mellitus (HCC) 06/24/2016   Obesity (BMI 30.0-34.9) 06/24/2016   Vitamin D deficiency 06/24/2016   History of shingles 06/24/2016   Allergic rhinitis 06/24/2016    Allergies  Allergen Reactions   Atorvastatin Other (See Comments)    Peripheral neuropathy and myalgia   Influenza Vaccines Rash    Past Surgical History:  Procedure Laterality Date   ABDOMINAL HYSTERECTOMY     BILATERAL CARPAL TUNNEL RELEASE     CATARACT EXTRACTION W/PHACO Right 11/12/2018   Procedure: CATARACT EXTRACTION PHACO AND INTRAOCULAR LENS PLACEMENT (IOC)  RIGHT DIABETIC;  Surgeon: [place] Crane, MD;   Location[facility];  Service: Ophthalmology;  Laterality: Right;  Diabetic - diet controlled sleep apnea   CERVICAL FUSION  2004   COLONOSCOPY WITH PROPOFOL N/A 07/22/2019   Procedure: COLONOSCOPY WITH BIOPSIES;  Surgeon: Midge Minium, MD;  [facility];  Service: Endoscopy;  Laterality: N/A;  Diabetic - diet controlled priority 4   FOOT SURGERY     LUMBAR DISC SURGERY  1998   POLYPECTOMY N/A 07/22/2019   Procedure: POLYPECTOMY;  Surgeon: Midge Minium, MD;  [facility];  Service: Endoscopy;  Laterality: N/A;    Social History   Tobacco Use   Smoking status: Never   Smokeless tobacco: Never  Vaping Use   Vaping Use: Never used  Substance Use Topics   Alcohol use: No   Drug use: No     Medication list has been reviewed and updated.  Current Meds  Medication Sig   acetaminophen (TYLENOL) 500 MG tablet Take 2 tablets (1,000 mg total) by mouth 2 (two) times daily.   amLODipine (NORVASC) 5 MG tablet Take 1 tablet by mouth once daily   Bisacodyl (LAXATIVE PO) Take by mouth. Generic OTC PRN   buPROPion (WELLBUTRIN XL) 300 MG 24 hr tablet Take 1 tablet (300 mg total) by mouth daily.   CALCIUM CITRATE PO Take 1,200 mg by mouth daily.   cholecalciferol (VITAMIN D3) 25 MCG (1000 UNIT) tablet  Take 1,000 Units by mouth daily.   ezetimibe (ZETIA) 10 MG tablet Take 1 tablet by mouth once daily   famotidine (PEPCID) 40 MG tablet Take 1 tablet (40 mg total) by mouth at bedtime.   glucose blood (ONETOUCH ULTRA) test strip Test Blood Sugar twice daily.   irbesartan (AVAPRO) 300 MG tablet Take 1 tablet (300 mg total) by mouth daily.   Lancets 33G MISC 1 each by Does not apply route daily.   meloxicam (MOBIC) 15 MG tablet Take 1 tablet (15 mg total) by mouth daily.   Multiple Vitamins-Minerals (MULTIVITAMIN ADULTS 50+ PO) Take 1 tablet by mouth daily.   NON FORMULARY CPAP @@ 12 cm H20   Omega-3 Fatty Acids (FISH OIL) 1000 MG CAPS Take 1 capsule by  mouth daily.   REFRESH 1.4-0.6 % SOLN Place 1 drop into both eyes as needed.       10/13/2021    1:38 PM 09/15/2021    8:50 AM 08/30/2021    4:16 PM 04/26/2021    8:59 AM  GAD 7 : Generalized Anxiety Score  Nervous, Anxious, on Edge 0 0 0 0  Control/stop worrying 0 0 0 0  Worry too much - different things 0 0 0 0  Trouble relaxing 0 0 0 0  Restless 0 0 0 0  Easily annoyed or irritable 0 0 0 0  Afraid - awful might happen 0 0 0 0  Total GAD 7 Score 0 0 0 0  Anxiety Difficulty Not difficult at all Not difficult at all Not difficult at all Not difficult at all       10/13/2021    1:38 PM  Depression screen PHQ 2/9  Decreased Interest 0  Down, Depressed, Hopeless 0  PHQ - 2 Score 0  Altered sleeping 0  Tired, decreased energy 0  Change in appetite 0  Feeling bad or failure about yourself  0  Trouble concentrating 0  Moving slowly or fidgety/restless 0  Suicidal thoughts 0  PHQ-9 Score 0  Difficult doing work/chores Not difficult at all    BP Readings from Last 3 Encounters:  11/02/21 134/68  10/13/21 132/62  09/15/21 136/72    Physical Exam Vitals and nursing note reviewed.  Constitutional:      General: She is not in acute distress.    Appearance: She is well-developed.  HENT:     Head: Normocephalic and atraumatic.     Right Ear: Tympanic membrane and ear canal normal.     Left Ear: Tympanic membrane and ear canal normal.     Nose:     Right Sinus: No maxillary sinus tenderness.     Left Sinus: No maxillary sinus tenderness.  Eyes:     General: No scleral icterus.       Right eye: No discharge.        Left eye: No discharge.     Conjunctiva/sclera: Conjunctivae normal.  Neck:     Thyroid: No thyromegaly.     Vascular: No carotid bruit.  Cardiovascular:     Rate and Rhythm: Normal rate and regular rhythm.     Pulses: Normal pulses.     Heart sounds: Normal heart sounds.  Pulmonary:     Effort: Pulmonary effort is normal. No respiratory distress.      Breath sounds: No wheezing.  Chest:  Breasts:    Right: No mass, nipple discharge, skin change or tenderness.     Left: No mass, nipple discharge, skin change or tenderness.  Abdominal:     General: Bowel sounds are normal.     Palpations: Abdomen is soft.     Tenderness: There is no abdominal tenderness.  Musculoskeletal:     Cervical back: Normal range of motion. No erythema.     Right lower leg: No edema.     Left lower leg: No edema.  Lymphadenopathy:     Cervical: No cervical adenopathy.  Skin:    General: Skin is warm and dry.     Findings: No rash.  Neurological:     Mental Status: She is alert and oriented to person, place, and time.     Cranial Nerves: No cranial nerve deficit.     Sensory: No sensory deficit.     Deep Tendon Reflexes: Reflexes are normal and symmetric.  Psychiatric:        Attention and Perception: Attention normal.        Mood and Affect: Mood normal.     Wt Readings from Last 3 Encounters:  11/02/21 203 lb (92.1 kg)  10/13/21 202 lb (91.6 kg)  09/15/21 200 lb (90.7 kg)    BP 134/68   Pulse 75   Ht 5\' 7"  (1.702 m)   Wt 203 lb (92.1 kg)   SpO2 98%   BMI 31.79 kg/m   Assessment and Plan: 1. Annual physical exam Normal exam Up to date on screenings and immunizations.  2. Essential hypertension Clinically stable exam with well controlled BP. Tolerating medications without side effects at this time. Pt to continue current regimen and low sodium diet; benefits of regular exercise as able discussed. - CBC with Differential/Platelet - TSH  3. Type II diabetes mellitus with complication (HCC) Clinically stable by exam and report without s/s of hypoglycemia. DM complicated by hypertension and dyslipidemia. Tolerating medications well without side effects or other concerns. - Comprehensive metabolic panel - Hemoglobin A1c - Microalbumin / creatinine urine ratio  4. Hyperlipidemia associated with type 2 diabetes mellitus (HCC) Tolerating  statin medication without side effects at this time LDL is at goal of < 70 on current dose Continue same therapy without change at this time. - Lipid panel  5. OSA on CPAP Doing well with good compliance and response.  6. Vitamin D deficiency Continue supplement with calcium - VITAMIN D 25 Hydroxy (Vit-D Deficiency, Fractures)  7. Gastroesophageal reflux disease, unspecified whether esophagitis present Symptoms well controlled on PPI. No red flag signs such as weight loss, n/v, melena Will continue omeprazole PRN. - CBC with Differential/Platelet   Partially dictated using Animal nutritionist. Any errors are unintentional.  Bari Edward, MD [facility]  11/02/2021

## 2021-11-03 LAB — COMPREHENSIVE METABOLIC PANEL
ALT: 18 IU/L (ref 0–32)
AST: 22 IU/L (ref 0–40)
Albumin/Globulin Ratio: 1.2 (ref 1.2–2.2)
Albumin: 4.1 g/dL (ref 3.7–4.7)
Alkaline Phosphatase: 104 IU/L (ref 44–121)
BUN/Creatinine Ratio: 13 (ref 12–28)
BUN: 13 mg/dL (ref 8–27)
Bilirubin Total: 0.4 mg/dL (ref 0.0–1.2)
CO2: 23 mmol/L (ref 20–29)
Calcium: 9.9 mg/dL (ref 8.7–10.3)
Chloride: 103 mmol/L (ref 96–106)
Creatinine, Ser: 1.04 mg/dL — ABNORMAL HIGH (ref 0.57–1.00)
Globulin, Total: 3.3 g/dL (ref 1.5–4.5)
Glucose: 150 mg/dL — ABNORMAL HIGH (ref 70–99)
Potassium: 4.5 mmol/L (ref 3.5–5.2)
Sodium: 140 mmol/L (ref 134–144)
Total Protein: 7.4 g/dL (ref 6.0–8.5)
eGFR: 54 mL/min/{1.73_m2} — ABNORMAL LOW (ref >59)

## 2021-11-03 LAB — LIPID PANEL
Chol/HDL Ratio: 3.1 ratio (ref 0.0–4.4)
Cholesterol, Total: 201 mg/dL — ABNORMAL HIGH (ref 100–199)
HDL: 64 mg/dL (ref >39)
LDL Chol Calc (NIH): 112 mg/dL — ABNORMAL HIGH (ref 0–99)
Triglycerides: 145 mg/dL (ref 0–149)
VLDL Cholesterol Cal: 25 mg/dL (ref 5–40)

## 2021-11-03 LAB — CBC WITH DIFFERENTIAL/PLATELET
Basophils Absolute: 0.1 10*3/uL (ref 0.0–0.2)
Basos: 2 %
EOS (ABSOLUTE): 0.1 10*3/uL (ref 0.0–0.4)
Eos: 2 %
Hematocrit: 37.1 % (ref 34.0–46.6)
Hemoglobin: 13.1 g/dL (ref 11.1–15.9)
Immature Grans (Abs): 0 10*3/uL (ref 0.0–0.1)
Immature Granulocytes: 0 %
Lymphocytes Absolute: 1.7 10*3/uL (ref 0.7–3.1)
Lymphs: 40 %
MCH: 31.6 pg (ref 26.6–33.0)
MCHC: 35.3 g/dL (ref 31.5–35.7)
MCV: 90 fL (ref 79–97)
Monocytes Absolute: 0.5 10*3/uL (ref 0.1–0.9)
Monocytes: 12 %
Neutrophils Absolute: 1.9 10*3/uL (ref 1.4–7.0)
Neutrophils: 44 %
Platelets: 209 10*3/uL (ref 150–450)
RBC: 4.14 x10E6/uL (ref 3.77–5.28)
RDW: 12.9 % (ref 11.7–15.4)
WBC: 4.4 10*3/uL (ref 3.4–10.8)

## 2021-11-03 LAB — MICROALBUMIN / CREATININE URINE RATIO
Creatinine, Urine: 127.8 mg/dL
Microalb/Creat Ratio: 9 mg/g creat (ref 0–29)
Microalbumin, Urine: 11.6 ug/mL

## 2021-11-03 LAB — VITAMIN D 25 HYDROXY (VIT D DEFICIENCY, FRACTURES): Vit D, 25-Hydroxy: 37.3 ng/mL (ref 30.0–100.0)

## 2021-11-03 LAB — HEMOGLOBIN A1C
Est. average glucose Bld gHb Est-mCnc: 146 mg/dL
Hgb A1c MFr Bld: 6.7 % — ABNORMAL HIGH (ref 4.8–5.6)

## 2021-11-03 LAB — TSH: TSH: 1.22 u[IU]/mL (ref 0.450–4.500)

## 2021-11-07 DIAGNOSIS — G4733 Obstructive sleep apnea (adult) (pediatric): Secondary | ICD-10-CM | POA: Diagnosis not present

## 2021-11-26 DIAGNOSIS — Z0100 Encounter for examination of eyes and vision without abnormal findings: Secondary | ICD-10-CM | POA: Diagnosis not present

## 2021-11-26 DIAGNOSIS — H524 Presbyopia: Secondary | ICD-10-CM | POA: Diagnosis not present

## 2021-11-26 DIAGNOSIS — E119 Type 2 diabetes mellitus without complications: Secondary | ICD-10-CM | POA: Diagnosis not present

## 2021-11-29 ENCOUNTER — Encounter: Payer: Self-pay | Admitting: Internal Medicine

## 2021-11-29 ENCOUNTER — Ambulatory Visit (INDEPENDENT_AMBULATORY_CARE_PROVIDER_SITE_OTHER): Payer: Medicare HMO | Admitting: Internal Medicine

## 2021-11-29 VITALS — BP 118/58 | HR 78 | Ht 67.0 in | Wt 202.0 lb

## 2021-11-29 DIAGNOSIS — R600 Localized edema: Secondary | ICD-10-CM

## 2021-11-29 DIAGNOSIS — E118 Type 2 diabetes mellitus with unspecified complications: Secondary | ICD-10-CM | POA: Diagnosis not present

## 2021-11-29 DIAGNOSIS — N1831 Chronic kidney disease, stage 3a: Secondary | ICD-10-CM | POA: Diagnosis not present

## 2021-11-29 DIAGNOSIS — K219 Gastro-esophageal reflux disease without esophagitis: Secondary | ICD-10-CM | POA: Diagnosis not present

## 2021-11-29 MED ORDER — FAMOTIDINE 40 MG PO TABS: 40.0000 mg | ORAL_TABLET | Freq: Every day | ORAL | 3 refills | Status: DC

## 2021-11-29 NOTE — Progress Notes (Signed)
Date:  11/29/2021   Name:  Molly Ruiz   DOB:  1940/08/18   MRN:  [id number]   Chief Complaint: Edema (Ankle swelling on left side. X1 week every day. ) and Labs Only (Wants to discuss lab results)  Diabetes She presents for her follow-up diabetic visit. She has type 2 diabetes mellitus. Her disease course has been stable. Pertinent negatives for hypoglycemia include no dizziness or headaches. Pertinent negatives for diabetes include no chest pain, no fatigue and no weakness.  Gastroesophageal Reflux She complains of heartburn. She reports no abdominal pain, no chest pain, no coughing or no wheezing. This is a recurrent problem. The problem occurs occasionally. Pertinent negatives include no fatigue. She has tried a histamine-2 antagonist for the symptoms. The treatment provided significant relief.  Edema - left lateral ankle at the end of the day. It resolves by the AM.  Occurring over the past week. She is Limiting sodium intake and drinking sufficient fluids. She is on amlodipine 5 mg but has been taking this for some time.   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  She was just aware of the findings and concerned.  She will continue to avoid NSAIDS, but can take tylenol.  Etiology is most likely HTN + age.   Lab Results  Component Value Date   NA 140 11/02/2021   K 4.5 11/02/2021   CO2 23 11/02/2021   GLUCOSE 150 (H) 11/02/2021   BUN 13 11/02/2021   CREATININE 1.04 (H) 11/02/2021   CALCIUM 9.9 11/02/2021   EGFR 54 (L) 11/02/2021   GFRNONAA 55 (L) 11/25/2019   Lab Results  Component Value Date   CHOL 201 (H) 11/02/2021   HDL 64 11/02/2021   LDLCALC 112 (H) 11/02/2021   TRIG 145 11/02/2021   CHOLHDL 3.1 11/02/2021   Lab Results  Component Value Date   TSH 1.220 11/02/2021   Lab Results  Component Value Date   HGBA1C 6.7 (H) 11/02/2021   Lab Results  Component Value Date   WBC 4.4 11/02/2021   HGB 13.1 11/02/2021   HCT 37.1 11/02/2021   MCV 90  11/02/2021   PLT 209 11/02/2021   Lab Results  Component Value Date   ALT 18 11/02/2021   AST 22 11/02/2021   ALKPHOS 104 11/02/2021   BILITOT 0.4 11/02/2021   Lab Results  Component Value Date   VD25OH 37.3 11/02/2021     Review of Systems  Constitutional:  Negative for fatigue and unexpected weight change.  HENT:  Negative for nosebleeds.   Eyes:  Negative for visual disturbance.  Respiratory:  Negative for cough, chest tightness, shortness of breath and wheezing.   Cardiovascular:  Positive for leg swelling (ankle swelling on lateral left). Negative for chest pain and palpitations.  Gastrointestinal:  Positive for heartburn. Negative for abdominal pain, constipation and diarrhea.  Neurological:  Negative for dizziness, weakness, light-headedness and headaches.    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/2017   Primary osteoarthritis of left hip 01/12/2017   OSA on CPAP 01/02/2017   Type II  diabetes mellitus with complication ([place]) 83/15/1761   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   Hyperlipidemia associated with type 2 diabetes mellitus ([place]) 06/24/2016   Obesity (BMI 30.0-34.9) 06/24/2016   Vitamin D deficiency 06/24/2016   History of shingles 06/24/2016   Allergic rhinitis 06/24/2016    Allergies  Allergen Reactions   Atorvastatin Other (See Comments)    Peripheral neuropathy and myalgia   Influenza Vaccines Rash    Past Surgical History:  Procedure Laterality Date   ABDOMINAL HYSTERECTOMY     BILATERAL CARPAL TUNNEL RELEASE     CATARACT EXTRACTION W/PHACO  Right 11/12/2018   Procedure: CATARACT EXTRACTION PHACO AND INTRAOCULAR LENS PLACEMENT ([place])  RIGHT DIABETIC;  Surgeon: Eulogio Bear, MD;  Location: [place];  Service: Ophthalmology;  Laterality: Right;  Diabetic - diet controlled sleep apnea   CERVICAL FUSION  2004   COLONOSCOPY WITH PROPOFOL N/A 07/22/2019   Procedure: COLONOSCOPY WITH BIOPSIES;  Surgeon: Lucilla Lame, MD;  Location: [place];  Service: Endoscopy;  Laterality: N/A;  Diabetic - diet controlled priority [place]   POLYPECTOMY N/A 07/22/2019   Procedure: POLYPECTOMY;  Surgeon: Lucilla Lame, MD;  Location: [place];  Service: Endoscopy;  Laterality: N/A;    Social History   Tobacco Use   Smoking status: Never   Smokeless tobacco: Never  Vaping Use   Vaping Use: Never used  Substance Use Topics   Alcohol use: No   Drug use: No     Medication list has been reviewed and updated.  Current Meds  Medication Sig   acetaminophen (TYLENOL) 500 MG tablet Take 2 tablets (1,000 mg total) by mouth 2 (two) times daily.   amLODipine (NORVASC) 5 MG tablet Take 1 tablet by mouth once daily   Bisacodyl (LAXATIVE PO) Take by mouth. Generic OTC PRN   buPROPion (WELLBUTRIN XL) 300 MG 24 hr tablet Take 1 tablet (300 mg total) by mouth daily.   CALCIUM CITRATE PO Take 1,200 mg by mouth daily.   cholecalciferol (VITAMIN D3) 25 MCG (1000 UNIT) tablet Take 1,000 Units by mouth daily.   ezetimibe (ZETIA) 10 MG tablet Take 1 tablet by mouth once daily   famotidine (PEPCID) 40 MG tablet Take 1 tablet (40 mg total) by mouth at bedtime.   glucose blood (ONETOUCH ULTRA) test strip Test Blood Sugar twice daily.   irbesartan (AVAPRO) 300 MG tablet Take 1 tablet (300 mg total) by mouth daily.   Lancets 33G MISC 1 each by Does not apply route daily.   meloxicam (MOBIC) 15 MG tablet Take 1 tablet (15 mg total) by mouth daily.   Multiple Vitamins-Minerals (MULTIVITAMIN  ADULTS 50+ PO) Take 1 tablet by mouth daily.   NON FORMULARY CPAP @@ 12 cm H20   Omega-3 Fatty Acids (FISH OIL) 1000 MG CAPS Take 1 capsule by mouth daily.   REFRESH 1.4-0.6 % SOLN Place 1 drop into both eyes as needed.       11/29/2021    8:43 AM 10/13/2021    1:38 PM 09/15/2021    8:50 AM 08/30/2021    4:16 PM  GAD 7 : Generalized Anxiety Score  Nervous, Anxious, on Edge 0 0 0 0  Control/stop worrying 0 0 0 0  Worry too much - different things 0 0 0 0  Trouble relaxing 0 0 0 0  Restless 0 0 0 0  Easily annoyed or irritable 0  0 0 0  Afraid - awful might happen 0 0 0 0  Total GAD 7 Score 0 0 0 0  Anxiety Difficulty Not difficult at all Not difficult at all Not difficult at all Not difficult at all       11/29/2021    8:43 AM 10/13/2021    1:38 PM 09/15/2021    8:49 AM  Depression screen PHQ 2/9  Decreased Interest 0 0 0  Down, Depressed, Hopeless 0 0 0  PHQ - 2 Score 0 0 0  Altered sleeping 1 0 1  Tired, decreased energy 1 0 3  Change in appetite 0 0 1  Feeling bad or failure about yourself  0 0 0  Trouble concentrating 0 0 0  Moving slowly or fidgety/restless 0 0 0  Suicidal thoughts 0 0 0  PHQ-9 Score 2 0 5  Difficult doing work/chores Not difficult at all Not difficult at all Not difficult at all    BP Readings from Last 3 Encounters:  11/29/21 (!) 118/58  11/02/21 134/68  10/13/21 132/62    Physical Exam Vitals and nursing note reviewed.  Constitutional:      General: She is not in acute distress.    Appearance: Normal appearance. She is well-developed.  HENT:     Head: Normocephalic and atraumatic.  Cardiovascular:     Rate and Rhythm: Normal rate and regular rhythm.     Heart sounds: No murmur heard.    No friction rub.     Comments: Minimal soft tissue swelling left lateral ankle - not red or tender Pulmonary:     Effort: Pulmonary effort is normal. No respiratory distress.     Breath sounds: No wheezing or rhonchi.  Musculoskeletal:     Cervical back:  Normal range of motion.     Right lower leg: No edema.     Left lower leg: No edema.  Skin:    General: Skin is warm and dry.     Findings: No rash.  Neurological:     Mental Status: She is alert and oriented to person, place, and time.  Psychiatric:        Mood and Affect: Mood normal.        Behavior: Behavior normal.     Wt Readings from Last 3 Encounters:  11/29/21 202 lb (91.6 kg)  11/02/21 203 lb (92.1 kg)  10/13/21 202 lb (91.6 kg)    BP (!) 118/58   Pulse 78   Ht 5' 7"  (1.702 m)   Wt 202 lb (91.6 kg)   SpO2 97%   BMI 31.64 kg/m   Assessment and Plan: 1. Stage 3a chronic kidney disease (HCC) Stable over 4 years Avoid nsaids Will monitor closely for worsening Can consider Farxiga  2. Gastroesophageal reflux disease, unspecified whether esophagitis present Symptoms well controlled on daily medication No red flag signs such as weight loss, n/v, melena Will continue Pepcid - famotidine (PEPCID) 40 MG tablet; Take 1 tablet (40 mg total) by mouth at bedtime.  Dispense: 90 tablet; Refill: 3  3. Type II diabetes mellitus with complication (HCC) Clinically stable by exam and report without s/s of hypoglycemia. DM complicated by hypertension and dyslipidemia. Tolerating medications well without side effects or other concerns.  4. Localized edema Mild dependent edema Recommend limiting sodium and elevation as needed Medications reviewed - on amlodipine 5 mg could be contributing   Partially dictated using Editor, commissioning. Any errors are unintentional.  [name], MD [facility]  11/29/2021

## 2021-12-03 ENCOUNTER — Ambulatory Visit
Admission: EM | Admit: 2021-12-03 | Discharge: 2021-12-03 | Disposition: A | Discharge status: Home / Self Care | Payer: Medicare HMO | Attending: Emergency Medicine | Admitting: Emergency Medicine

## 2021-12-03 DIAGNOSIS — B372 Candidiasis of skin and nail: Secondary | ICD-10-CM

## 2021-12-03 MED ORDER — NYSTATIN 100000 UNIT/GM EX CREA: TOPICAL_CREAM | CUTANEOUS | 1 refills | Status: AC

## 2021-12-03 NOTE — ED Triage Notes (Signed)
Pt present rash located underneath right side abdomen area. Pt noticed the rash two days ago, the rash is itching and raw with some burning.

## 2021-12-03 NOTE — ED Provider Notes (Signed)
[facility]    CSN: [id number] Arrival date & time: 12/03/21  2094      History   Chief Complaint Chief Complaint  Patient presents with   Rash    HPI Molly Ruiz is a 81 y.o. female.   HPI  81 year old female here for evaluation of skin complaint.  Patient reports that Molly Ruiz noticed a wall, burning, wet area under the skin folds of her abdomen on the right side 2 days ago.  Molly Ruiz states that the area is moist but Molly Ruiz denies any fever.  Or drainage.  Molly Ruiz states that Molly Ruiz does not notice any increase in her symptoms when Molly Ruiz gets hot or if Molly Ruiz is in the shower.  Molly Ruiz is concerned it may be a yeast infection.  Past Medical History:  Diagnosis Date   Complication of anesthesia    Hair falls out.   Depression    Diabetes mellitus without complication ([place])    Hyperlipidemia    Hypertension    Sleep apnea    CPAP   Wears dentures    partial upper    Patient Active Problem List   Diagnosis Date Noted   Stage 3a chronic kidney disease ([place]) 11/29/2021   Acute pain of right shoulder 09/24/2020   Myalgia due to statin 03/31/2020   Gastroesophageal reflux disease 09/19/2019   Special screening for malignant neoplasms, colon    Tubular adenoma of colon    Anxiety disorder 06/13/2019   Osteopenia determined by x-ray 06/06/2018   Essential hypertension 04/26/2018   Eczema 10/25/2017   Slow transit constipation 02/06/2017   Primary osteoarthritis of left hip 01/12/2017   OSA on CPAP 01/02/2017   Type II diabetes mellitus with complication ([place]) 70/96/2836   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   Hyperlipidemia associated with type 2 diabetes mellitus ([place]) 06/24/2016   Obesity (BMI 30.0-34.9) 06/24/2016   Vitamin D deficiency 06/24/2016   History of shingles 06/24/2016   Allergic rhinitis 06/24/2016    Past Surgical History:  Procedure Laterality Date   ABDOMINAL HYSTERECTOMY     BILATERAL CARPAL TUNNEL RELEASE     CATARACT  EXTRACTION W/PHACO Right 11/12/2018   Procedure: CATARACT EXTRACTION PHACO AND INTRAOCULAR LENS PLACEMENT ([place])  RIGHT DIABETIC;  Surgeon: Eulogio Bear, MD;  Location: [place];  Service: Ophthalmology;  Laterality: Right;  Diabetic - diet controlled sleep apnea   CERVICAL FUSION  2004   COLONOSCOPY WITH PROPOFOL N/A 07/22/2019   Procedure: COLONOSCOPY WITH BIOPSIES;  Surgeon: Lucilla Lame, MD;  Location: [place];  Service: Endoscopy;  Laterality: N/A;  Diabetic - diet controlled priority [place]   POLYPECTOMY N/A 07/22/2019   Procedure: POLYPECTOMY;  Surgeon: Lucilla Lame, MD;  Location: [place];  Service: Endoscopy;  Laterality: N/A;    OB History   No obstetric history on file.      Home Medications    Prior to Admission medications   Medication Sig Start Date End Date Taking? Authorizing Provider  nystatin cream (MYCOSTATIN) Apply to affected area 2 times daily 12/03/21  Yes Margarette Canada, NP  acetaminophen (TYLENOL) 500 MG tablet Take 2 tablets (1,000 mg total) by mouth 2 (two) times daily. 12/20/16   Plonk, Gwyndolyn Saxon, MD  amLODipine (NORVASC) 5 MG tablet Take 1 tablet by mouth once daily 10/05/21   Glean Hess, MD  Bisacodyl (LAXATIVE PO) Take by mouth. Generic OTC PRN  [provider]  buPROPion (WELLBUTRIN XL) 300 MG 24 hr tablet Take 1 tablet (300 mg total) by mouth daily. 10/13/21   Glean Hess, MD  CALCIUM CITRATE PO Take 1,200 mg by mouth daily.    [provider]  cholecalciferol (VITAMIN D3) 25 MCG (1000 UNIT) tablet Take 1,000 Units by mouth daily.    [provider]  ezetimibe (ZETIA) 10 MG tablet Take 1 tablet by mouth once daily 10/05/21   Glean Hess, MD  famotidine (PEPCID) 40 MG tablet Take 1 tablet (40 mg total) by mouth at bedtime. 11/29/21   Glean Hess, MD  glucose blood (ONETOUCH ULTRA) test strip Test Blood Sugar twice daily. 09/15/21    Glean Hess, MD  irbesartan (AVAPRO) 300 MG tablet Take 1 tablet (300 mg total) by mouth daily. 10/13/21   Glean Hess, MD  Lancets 33G MISC 1 each by Does not apply route daily. 10/29/20   Glean Hess, MD  meloxicam (MOBIC) 15 MG tablet Take 1 tablet (15 mg total) by mouth daily. 08/25/20   Glean Hess, MD  Multiple Vitamins-Minerals (MULTIVITAMIN ADULTS 50+ PO) Take 1 tablet by mouth daily.    [provider]  NON FORMULARY CPAP @@ 12 cm H20    [provider]  Omega-3 Fatty Acids (FISH OIL) 1000 MG CAPS Take 1 capsule by mouth daily.    [provider]  REFRESH 1.4-0.6 % SOLN Place 1 drop into both eyes as needed. 10/25/19   [provider]    Family History Family History  Problem Relation Age of Onset   Diabetes Mother    Stroke Mother    Heart disease Sister    Stroke Brother    Prostate cancer Brother    Breast cancer Other 16    Social History Social History   Tobacco Use   Smoking status: Never   Smokeless tobacco: Never  Vaping Use   Vaping Use: Never used  Substance Use Topics   Alcohol use: No   Drug use: No     Allergies   Atorvastatin and Influenza vaccines   Review of Systems Review of Systems  Constitutional:  Negative for fever.  Skin:  Positive for color change and rash.  Hematological: Negative.   Psychiatric/Behavioral: Negative.       Physical Exam Triage Vital Signs ED Triage Vitals  Enc Vitals Group     BP 12/03/21 0950 (!) 143/58     Pulse Rate 12/03/21 0950 73     Resp 12/03/21 0950 18     Temp 12/03/21 0950 98.3 F (36.8 C)     Temp Source 12/03/21 0950 Oral     SpO2 12/03/21 0950 100 %     Weight --      Height --      Head Circumference --      Peak Flow --      Pain Score 12/03/21 0949 0     Pain Loc --      Pain Edu? --      Excl. in [place]? --    No data found.  Updated Vital Signs BP (!) 143/58 (BP Location: Right Arm)   Pulse 73   Temp 98.3 F (36.8 C) (Oral)    Resp 18   SpO2 100%   Visual Acuity Right Eye Distance:   Left Eye Distance:   Bilateral Distance:    Right Eye Near:   Left Eye Near:    Bilateral  Near:     Physical Exam Vitals and nursing note reviewed.  Constitutional:      Appearance: Normal appearance. Molly Ruiz is not ill-appearing.  HENT:     Head: Normocephalic and atraumatic.  Skin:    General: Skin is warm.     Capillary Refill: Capillary refill takes less than 2 seconds.     Findings: Erythema and rash present.  Neurological:     General: No focal deficit present.     Mental Status: Molly Ruiz is alert and oriented to person, place, and time.  Psychiatric:        Mood and Affect: Mood normal.        Behavior: Behavior normal.        Thought Content: Thought content normal.        Judgment: Judgment normal.       UC Treatments / Results  Labs (all labs ordered are listed, but only abnormal results are displayed) Labs Reviewed - No data to display  EKG   Radiology No results found.  Procedures Procedures (including critical care time)  Medications Ordered in UC Medications - No data to display  Initial Impression / Assessment and Plan / UC Course  I have reviewed the triage vital signs and the nursing notes.  Pertinent labs & imaging results that were available during my care of the patient were reviewed by me and considered in my medical decision making (see chart for details).  Patient is a pleasant, nontoxic-appearing 64-year-old female here for evaluation of a brawl, burning, red rash under the skin folds of her right abdomen that has been present for the past 2 days.  Patient denies any drainage or fever.  Molly Ruiz states it does not get worse when Molly Ruiz is in the shower or when Molly Ruiz gets hot.  On exam patient does have a large erythematous patch of skin underneath the skin folds of her pannus on the right.  This does have a greasy coating.  There is no induration or fluctuance noted.  The rash is not hot.  He does  appear to be a skin yeast infection.  I will treat her with nystatin cream twice daily until the rash resolves.  I have also instructed her to leave the area open to air is much as possible and to put gauze between her skin folds to allow airflow.  I did give her a packet gauze to take home and directed her to pick up more the pharmacy.  I further instructed the patient to return if Molly Ruiz has any increased redness, pain, swelling, or fevers.   Final Clinical Impressions(s) / UC Diagnoses   Final diagnoses:  Skin yeast infection     Discharge Instructions      Apply the Nystatin twice daily until healing is complete.  You need to leave your skin open to air so that the yeast dies and the rash dries out.  Put gauze between your skin folds to allow airflow and help resolve the infection.  If you develop increased redness, pain, swelling, or fever please return for re-evaluation.      ED Prescriptions     Medication Sig Dispense Auth. Provider   nystatin cream (MYCOSTATIN) Apply to affected area 2 times daily 30 g Margarette Canada, NP      PDMP not reviewed this encounter.   Margarette Canada, NP 12/03/21 1012

## 2021-12-03 NOTE — Discharge Instructions (Addendum)
Apply the Nystatin twice daily until healing is complete.  You need to leave your skin open to air so that the yeast dies and the rash dries out.  Put gauze between your skin folds to allow airflow and help resolve the infection.  If you develop increased redness, pain, swelling, or fever please return for re-evaluation.

## 2021-12-07 ENCOUNTER — Encounter: Payer: Self-pay | Admitting: Family Medicine

## 2021-12-07 ENCOUNTER — Ambulatory Visit: Payer: Self-pay

## 2021-12-07 ENCOUNTER — Ambulatory Visit (INDEPENDENT_AMBULATORY_CARE_PROVIDER_SITE_OTHER): Payer: Medicare HMO | Admitting: Family Medicine

## 2021-12-07 VITALS — BP 128/70 | HR 76 | Ht 67.0 in | Wt 201.0 lb

## 2021-12-07 DIAGNOSIS — B372 Candidiasis of skin and nail: Secondary | ICD-10-CM

## 2021-12-07 MED ORDER — MUPIROCIN 2 % EX OINT: 1.0000 | TOPICAL_OINTMENT | Freq: Two times a day (BID) | CUTANEOUS | 3 refills | Status: AC

## 2021-12-07 NOTE — Progress Notes (Signed)
Date:  12/07/2021   Name:  Molly Ruiz   DOB:  [birth year]   MRN:  [id number]   Chief Complaint: Rash (Yeast under skin folds- looks better, not as red, but is "sore")  Rash    Lab Results  Component Value Date   NA 140 11/02/2021   K 4.5 11/02/2021   CO2 23 11/02/2021   GLUCOSE 150 (H) 11/02/2021   BUN 13 11/02/2021   CREATININE 1.04 (H) 11/02/2021   CALCIUM 9.9 11/02/2021   EGFR 54 (L) 11/02/2021   GFRNONAA 55 (L) 11/25/2019   Lab Results  Component Value Date   CHOL 201 (H) 11/02/2021   HDL 64 11/02/2021   LDLCALC 112 (H) 11/02/2021   TRIG 145 11/02/2021   CHOLHDL 3.1 11/02/2021   Lab Results  Component Value Date   TSH 1.220 11/02/2021   Lab Results  Component Value Date   HGBA1C 6.7 (H) 11/02/2021   Lab Results  Component Value Date   WBC 4.4 11/02/2021   HGB 13.1 11/02/2021   HCT 37.1 11/02/2021   MCV 90 11/02/2021   PLT 209 11/02/2021   Lab Results  Component Value Date   ALT 18 11/02/2021   AST 22 11/02/2021   ALKPHOS 104 11/02/2021   BILITOT 0.4 11/02/2021   Lab Results  Component Value Date   VD25OH 37.3 11/02/2021     Review of Systems  Skin:  Positive for rash.    Patient Active Problem List   Diagnosis Date Noted   Stage 3a chronic kidney disease ([place]) 11/29/2021   Acute pain of right shoulder 09/24/2020   Myalgia due to statin 03/31/2020   Gastroesophageal reflux disease 09/19/2019   Special screening for malignant neoplasms, colon    Tubular adenoma of colon    Anxiety disorder 06/13/2019   Osteopenia determined by x-ray 06/06/2018   Essential hypertension 04/26/2018   Eczema 10/25/2017   Slow transit constipation 02/06/2017   Primary osteoarthritis of left hip 01/12/2017   OSA on CPAP 01/02/2017   Type II diabetes mellitus with complication ([place]) 93/71/6967   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   Hyperlipidemia associated with type 2 diabetes mellitus ([place]) 06/24/2016   Obesity (BMI  30.0-34.9) 06/24/2016   Vitamin D deficiency 06/24/2016   History of shingles 06/24/2016   Allergic rhinitis 06/24/2016    Allergies  Allergen Reactions   Atorvastatin Other (See Comments)    Peripheral neuropathy and myalgia   Influenza Vaccines Rash    Past Surgical History:  Procedure Laterality Date   ABDOMINAL HYSTERECTOMY     BILATERAL CARPAL TUNNEL RELEASE     CATARACT EXTRACTION W/PHACO Right 11/12/2018   Procedure: CATARACT EXTRACTION PHACO AND INTRAOCULAR LENS PLACEMENT ([place])  RIGHT DIABETIC;  Surgeon: Eulogio Bear, MD;  Location: [place];  Service: Ophthalmology;  Laterality: Right;  Diabetic - diet controlled sleep apnea   CERVICAL FUSION  2004   COLONOSCOPY WITH PROPOFOL N/A 07/22/2019   Procedure: COLONOSCOPY WITH BIOPSIES;  Surgeon: Lucilla Lame, MD;  Location: [facility];  Service: Endoscopy;  Laterality: N/A;  Diabetic - diet controlled priority [place]   POLYPECTOMY N/A 07/22/2019   Procedure: POLYPECTOMY;  Surgeon: Lucilla Lame, MD;  Location: [facility];  Service: Endoscopy;  Laterality: N/A;    Social History   Tobacco Use   Smoking status: Never   Smokeless tobacco: Never  Vaping Use  Vaping Use: Never used  Substance Use Topics   Alcohol use: No   Drug use: No     Medication list has been reviewed and updated.  Current Meds  Medication Sig   acetaminophen (TYLENOL) 500 MG tablet Take 2 tablets (1,000 mg total) by mouth 2 (two) times daily.   amLODipine (NORVASC) 5 MG tablet Take 1 tablet by mouth once daily   Bisacodyl (LAXATIVE PO) Take by mouth. Generic OTC PRN   buPROPion (WELLBUTRIN XL) 300 MG 24 hr tablet Take 1 tablet (300 mg total) by mouth daily.   CALCIUM CITRATE PO Take 1,200 mg by mouth daily.   cholecalciferol (VITAMIN D3) 25 MCG (1000 UNIT) tablet Take 1,000 Units by mouth daily.   ezetimibe (ZETIA) 10 MG tablet Take 1 tablet by mouth once daily    famotidine (PEPCID) 40 MG tablet Take 1 tablet (40 mg total) by mouth at bedtime.   glucose blood (ONETOUCH ULTRA) test strip Test Blood Sugar twice daily.   irbesartan (AVAPRO) 300 MG tablet Take 1 tablet (300 mg total) by mouth daily.   Lancets 33G MISC 1 each by Does not apply route daily.   meloxicam (MOBIC) 15 MG tablet Take 1 tablet (15 mg total) by mouth daily.   Multiple Vitamins-Minerals (MULTIVITAMIN ADULTS 50+ PO) Take 1 tablet by mouth daily.   NON FORMULARY CPAP @@ 12 cm H20   nystatin cream (MYCOSTATIN) Apply to affected area 2 times daily   Omega-3 Fatty Acids (FISH OIL) 1000 MG CAPS Take 1 capsule by mouth daily.   REFRESH 1.4-0.6 % SOLN Place 1 drop into both eyes as needed.       12/07/2021    2:33 PM 11/29/2021    8:43 AM 10/13/2021    1:38 PM 09/15/2021    8:50 AM  GAD 7 : Generalized Anxiety Score  Nervous, Anxious, on Edge 0 0 0 0  Control/stop worrying 0 0 0 0  Worry too much - different things 0 0 0 0  Trouble relaxing 0 0 0 0  Restless 0 0 0 0  Easily annoyed or irritable 0 0 0 0  Afraid - awful might happen 0 0 0 0  Total GAD 7 Score 0 0 0 0  Anxiety Difficulty Not difficult at all Not difficult at all Not difficult at all Not difficult at all       12/07/2021    2:33 PM 11/29/2021    8:43 AM 10/13/2021    1:38 PM  Depression screen PHQ 2/9  Decreased Interest 0 0 0  Down, Depressed, Hopeless 0 0 0  PHQ - 2 Score 0 0 0  Altered sleeping 0 1 0  Tired, decreased energy 0 1 0  Change in appetite 0 0 0  Feeling bad or failure about yourself  0 0 0  Trouble concentrating 0 0 0  Moving slowly or fidgety/restless 0 0 0  Suicidal thoughts 0 0 0  PHQ-9 Score 0 2 0  Difficult doing work/chores Not difficult at all Not difficult at all Not difficult at all    BP Readings from Last 3 Encounters:  12/07/21 128/70  12/03/21 (!) 143/58  11/29/21 (!) 118/58    Physical Exam  Wt Readings from Last 3 Encounters:  12/07/21 201 lb (91.2 kg)  11/29/21 202 lb  (91.6 kg)  11/02/21 203 lb (92.1 kg)    BP 128/70   Pulse 76   Ht 5' 7"  (1.702 m)   Wt 201 lb (91.2  kg)   BMI 31.48 kg/m   Assessment and Plan:  1. Candidiasis of skin New onset.  Episodic.  Stable.  Patient's had some improvement of this yeast infection since the initiation of nystatin cream but it has not totally resolved there is some areas of erythema that may be secondary to some secondary bacterial overgrowth and I have instructed the patient to clean the area dry it well she may use some white vinegar to to use in this area as well.  She is apply both her topical nystatin and Bactroban mixed together in case there is a little secondary bacterial infection.  If this does not resolve please let us know and we can arrange to have dermatology become involved.  Patient has been given information on yeast infections.

## 2021-12-07 NOTE — Telephone Encounter (Signed)
2nd attempt, Patient called, left VM to return the call to the office to discuss symptoms with a nurse.  

## 2021-12-07 NOTE — Patient Instructions (Signed)
Skin Yeast Infection  A skin yeast infection is a condition in which there is an overgrowth of yeast (Candida) that normally lives on the skin. This condition usually occurs in areas of the skin that are constantly warm and moist, such as the skin under the breasts or armpits, or in the groin and other body folds. What are the causes? This condition is caused by a change in the normal balance of the yeast that live on the skin. What increases the risk? You are more likely to develop this condition if you: Are obese. Are pregnant. Are 65 years of age or older. Wear tight clothing. Have any of the following conditions: Diabetes. Malnutrition. A weak body defense system (immune system). Take medicines such as: Birth control pills. Antibiotics. Steroid medicines. What are the signs or symptoms? The most common symptom of this condition is itchiness in the affected area. Other symptoms include: A red, swollen area of the skin. Bumps on the skin. How is this diagnosed? This condition is diagnosed with a medical history and physical exam. Your health care provider may check for yeast by taking scrapings of the skin to be viewed under a microscope. How is this treated? This condition is treated with medicine. Medicines may be prescribed or available over the counter. The medicines may be: Taken by mouth (orally). Applied as a cream or powder to your skin. Follow these instructions at home:  Take or apply over-the-counter and prescription medicines only as told by your health care provider. Maintain a healthy weight. If you need help losing weight, talk with your health care provider. Keep your skin clean and dry. Wear loose-fitting clothing. If you have diabetes, keep your blood sugar under control. Keep all follow-up visits. This is important. Contact a health care provider if: Your symptoms go away and then come back. Your symptoms do not get better with treatment. Your symptoms get  worse. Your rash spreads. You have a fever or chills. You have new symptoms. You have new warmth or redness of your skin. Your rash is painful or bleeding. Summary A skin yeast infection is a condition in which there is an overgrowth of yeast (Candida) that normally lives on the skin. Take or apply over-the-counter and prescription medicines only as told by your health care provider. Keep your skin clean and dry. Contact a health care provider if your symptoms do not get better with treatment. This information is not intended to replace advice given to you by your health care provider. Make sure you discuss any questions you have with your health care provider. Document Revised: 07/14/2020 Document Reviewed: 07/14/2020 Elsevier Patient Education  2023 [employer] Inc.  

## 2021-12-07 NOTE — Telephone Encounter (Signed)
Pt called, unable to leave VM d/t line kept ringing, no VM available. Will try again later.   Summary: yeast area sore   Pt says that she was seen at Urgent Care and was prescribed a medication. Pt says that she was told that her yeast infection is on the outside of her stomach. Pt says that the area is sore and would like to be advised/discuss further.    Please advise.

## 2021-12-07 NOTE — Telephone Encounter (Signed)
  Chief Complaint: recently seen at UC-treated for yeast- medication given has not cleared area of yeast Symptoms: redness,irritation in folds of skin -abdomen Frequency:   Pertinent Negatives: Patient denies   Disposition: '[]'$ ED /'[]'$ Urgent Care (no appt availability in office) / '[x]'$ Appointment(In office/virtual)/ '[]'$  [facility]/ '[]'$ Home Care/ '[]'$ Refused Recommended Disposition /'[]'$ [facility]/ '[]'$  Follow-up with PCP Additional Notes: Appointment scheduled to recheck skin and see if patient needs additional medication    Reason for Disposition  [1] Caller has medicine question about med NOT prescribed by PCP AND [2] triager unable to answer question (e.g., compatibility with other med, storage)  Answer Assessment - Initial Assessment Questions 1. NAME of MEDICINE: "What medicine(s) are you calling about?"     Nystatin cream 2. QUESTION: "What is your question?" (e.g., double dose of medicine, side effect)     Not clearing up completely 3. PRESCRIBER: "Who prescribed the medicine?" Reason: if prescribed by specialist, call should be referred to that group.     UC provider 4. SYMPTOMS: "Do you have any symptoms?" If Yes, ask: "What symptoms are you having?"  "How bad are the symptoms (e.g., mild, moderate, severe)     Redness is better- still has areas of concern- on end of irritated area 5. PREGNANCY:  "Is there any chance that you are pregnant?" "When was your last menstrual period?"  Protocols used: Medication Question Call-A-AH

## 2021-12-08 DIAGNOSIS — G4733 Obstructive sleep apnea (adult) (pediatric): Secondary | ICD-10-CM | POA: Diagnosis not present

## 2021-12-16 ENCOUNTER — Encounter: Payer: Self-pay | Admitting: Internal Medicine

## 2021-12-16 LAB — HM DIABETES EYE EXAM

## 2021-12-20 DIAGNOSIS — Z0100 Encounter for examination of eyes and vision without abnormal findings: Secondary | ICD-10-CM | POA: Diagnosis not present

## 2022-01-03 ENCOUNTER — Other Ambulatory Visit: Payer: Self-pay | Admitting: Internal Medicine

## 2022-01-03 DIAGNOSIS — E1169 Type 2 diabetes mellitus with other specified complication: Secondary | ICD-10-CM

## 2022-01-04 NOTE — Telephone Encounter (Signed)
Requested Prescriptions  Pending Prescriptions Disp Refills  . ezetimibe (ZETIA) 10 MG tablet [Pharmacy Med Name: Ezetimibe 10 MG Oral Tablet] 90 tablet 0    Sig: Take 1 tablet by mouth once daily     Cardiovascular:  Antilipid - Sterol Transport Inhibitors Failed - 01/03/2022  9:23 PM      Failed - Lipid Panel in normal range within the last 12 months    Cholesterol, Total  Date Value Ref Range Status  11/02/2021 201 (H) 100 - 199 mg/dL Final   LDL Chol Calc (NIH)  Date Value Ref Range Status  11/02/2021 112 (H) 0 - 99 mg/dL Final   HDL  Date Value Ref Range Status  11/02/2021 64 >39 mg/dL Final   Triglycerides  Date Value Ref Range Status  11/02/2021 145 0 - 149 mg/dL Final         Passed - AST in normal range and within 360 days    AST  Date Value Ref Range Status  11/02/2021 22 0 - 40 IU/L Final         Passed - ALT in normal range and within 360 days    ALT  Date Value Ref Range Status  11/02/2021 18 0 - 32 IU/L Final         Passed - Patient is not pregnant      Passed - Valid encounter within last 12 months    Recent Outpatient Visits          4 weeks ago Candidiasis of skin   [place] [facility] [place], Deanna C, MD   1 month ago Stage 3a chronic kidney disease [facility])   [place] [facility], Jesse Sans, MD   2 months ago Annual physical exam   [place] [facility] at [facility], Jesse Sans, MD   2 months ago Cellulitis of finger of right hand   [place] [facility] at [facility], Jesse Sans, MD   3 months ago [place] and Sports Medicine at [facility], Jesse Sans, MD      Future Appointments            In 1 month Glean Hess, MD [place] [facility] at [facility], [facility]   In 10 months Glean Hess, MD [facility]  [facility] at [facility], [place]           . amLODipine (NORVASC) 5 MG tablet [Pharmacy Med Name: amLODIPine Besylate 5 MG Oral Tablet] 90 tablet 0    Sig: Take 1 tablet by mouth once daily     Cardiovascular: Calcium Channel Blockers 2 Passed - 01/03/2022  9:23 PM      Passed - Last BP in normal range    BP Readings from Last 1 Encounters:  12/07/21 128/70         Passed - Last Heart Rate in normal range    Pulse Readings from Last 1 Encounters:  12/07/21 76         Passed - Valid encounter within last 6 months    Recent Outpatient Visits          4 weeks ago Candidiasis of skin   [place] [facility] at [place], Deanna C, MD   1 month ago  Stage 3a chronic kidney disease ([place])   [place] [facility] at [facility], Jesse Sans, MD   2 months ago Annual physical exam   [place] [facility] at [facility], Jesse Sans, MD   2 months ago Cellulitis of finger of right hand    Primary Care and Sports Medicine at [facility], Jesse Sans, MD   3 months ago [place] [facility] at [facility], Jesse Sans, MD      Future Appointments            In 1 month [facility] Melia, Jesse Sans, MD [facility] at [facility], [facility]   In 10 months Army Melia, Jesse Sans, MD [place] [facility] at [facility], [facility]

## 2022-01-26 DIAGNOSIS — G4733 Obstructive sleep apnea (adult) (pediatric): Secondary | ICD-10-CM | POA: Diagnosis not present

## 2022-03-04 ENCOUNTER — Ambulatory Visit: Payer: Medicare HMO | Admitting: Internal Medicine

## 2022-03-09 ENCOUNTER — Encounter: Payer: Self-pay | Admitting: Internal Medicine

## 2022-03-09 ENCOUNTER — Ambulatory Visit (INDEPENDENT_AMBULATORY_CARE_PROVIDER_SITE_OTHER): Payer: Medicare HMO | Admitting: Internal Medicine

## 2022-03-09 VITALS — BP 126/60 | HR 76 | Ht 67.0 in | Wt 205.0 lb

## 2022-03-09 DIAGNOSIS — M720 Palmar fascial fibromatosis [Dupuytren]: Secondary | ICD-10-CM | POA: Diagnosis not present

## 2022-03-09 DIAGNOSIS — Z1231 Encounter for screening mammogram for malignant neoplasm of breast: Secondary | ICD-10-CM | POA: Diagnosis not present

## 2022-03-09 DIAGNOSIS — I10 Essential (primary) hypertension: Secondary | ICD-10-CM | POA: Diagnosis not present

## 2022-03-09 DIAGNOSIS — E118 Type 2 diabetes mellitus with unspecified complications: Secondary | ICD-10-CM

## 2022-03-09 DIAGNOSIS — T466X5A Adverse effect of antihyperlipidemic and antiarteriosclerotic drugs, initial encounter: Secondary | ICD-10-CM | POA: Diagnosis not present

## 2022-03-09 DIAGNOSIS — G720 Drug-induced myopathy: Secondary | ICD-10-CM

## 2022-03-09 MED ORDER — AMLODIPINE BESYLATE 5 MG PO TABS: 5.0000 mg | ORAL_TABLET | Freq: Every day | ORAL | 1 refills | Status: DC

## 2022-03-09 MED ORDER — IRBESARTAN 300 MG PO TABS: 300.0000 mg | ORAL_TABLET | Freq: Every day | ORAL | 1 refills | Status: DC

## 2022-03-09 NOTE — Progress Notes (Signed)
Date:  03/09/2022   Name:  Molly Ruiz   DOB:  [birth year]   MRN:  [id number]   Chief Complaint: Diabetes and Hypertension  Diabetes She presents for her follow-up diabetic visit. She has type 2 diabetes mellitus. Pertinent negatives for hypoglycemia include no headaches or tremors. Pertinent negatives for diabetes include no chest pain, no fatigue, no polydipsia and no polyuria. Diabetic complications include a CVA. Current diabetic treatment includes diet. She is compliant with treatment most of the time. An ACE inhibitor/angiotensin II receptor blocker is being taken. Eye exam is current.  Hypertension This is a chronic problem. The problem is controlled. Pertinent negatives include no chest pain, headaches, palpitations or shortness of breath. Past treatments include calcium channel blockers and angiotensin blockers. The current treatment provides significant improvement. Hypertensive end-organ damage includes kidney disease and CVA. There is no history of CAD/MI.    Lab Results  Component Value Date   NA 140 11/02/2021   K 4.5 11/02/2021   CO2 23 11/02/2021   GLUCOSE 150 (H) 11/02/2021   BUN 13 11/02/2021   CREATININE 1.04 (H) 11/02/2021   CALCIUM 9.9 11/02/2021   EGFR 54 (L) 11/02/2021   GFRNONAA 55 (L) 11/25/2019   Lab Results  Component Value Date   CHOL 201 (H) 11/02/2021   HDL 64 11/02/2021   LDLCALC 112 (H) 11/02/2021   TRIG 145 11/02/2021   CHOLHDL 3.1 11/02/2021   Lab Results  Component Value Date   TSH 1.220 11/02/2021   Lab Results  Component Value Date   HGBA1C 6.7 (H) 11/02/2021   Lab Results  Component Value Date   WBC 4.4 11/02/2021   HGB 13.1 11/02/2021   HCT 37.1 11/02/2021   MCV 90 11/02/2021   PLT 209 11/02/2021   Lab Results  Component Value Date   ALT 18 11/02/2021   AST 22 11/02/2021   ALKPHOS 104 11/02/2021   BILITOT 0.4 11/02/2021   Lab Results  Component Value Date   VD25OH 37.3 11/02/2021     Review of Systems   Constitutional:  Negative for appetite change, fatigue, fever and unexpected weight change.  HENT:  Negative for tinnitus and trouble swallowing.   Eyes:  Negative for visual disturbance.  Respiratory:  Negative for cough, chest tightness and shortness of breath.   Cardiovascular:  Negative for chest pain, palpitations and leg swelling.  Gastrointestinal:  Negative for abdominal pain.  Endocrine: Negative for polydipsia and polyuria.  Genitourinary:  Negative for dysuria and hematuria.  Musculoskeletal:  Positive for arthralgias (pain in left palm).  Neurological:  Negative for tremors, numbness and headaches.  Psychiatric/Behavioral:  Negative for dysphoric mood.     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 03/09/2022   Stage 3a chronic kidney disease ([place]) 11/29/2021   Acute pain of right shoulder 09/24/2020   Statin myopathy 03/31/2020   Gastroesophageal reflux disease 09/19/2019   Special screening for malignant neoplasms, colon    Tubular adenoma of colon    Anxiety disorder 06/13/2019   Osteopenia determined by x-ray 06/06/2018   Essential hypertension 04/26/2018   Eczema 10/25/2017   Slow transit constipation 02/06/2017   Primary osteoarthritis of left hip 01/12/2017   OSA on CPAP 01/02/2017   Type II diabetes mellitus with complication ([place]) 48/88/9169   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   Hyperlipidemia associated with type 2 diabetes mellitus ([place]) 06/24/2016   Obesity (BMI 30.0-34.9)  06/24/2016   Vitamin D deficiency 06/24/2016   History of shingles 06/24/2016   Allergic rhinitis 06/24/2016    Allergies  Allergen Reactions   Atorvastatin Other (See Comments)    Peripheral neuropathy and myalgia   Influenza Vaccines Rash    Past Surgical History:  Procedure Laterality Date   ABDOMINAL HYSTERECTOMY     BILATERAL CARPAL TUNNEL RELEASE     CATARACT EXTRACTION W/PHACO Right  11/12/2018   Procedure: CATARACT EXTRACTION PHACO AND INTRAOCULAR LENS PLACEMENT ([facility])  RIGHT DIABETIC;  Surgeon: Eulogio Bear, MD;  Location: [facility];  Service: Ophthalmology;  Laterality: Right;  Diabetic - diet controlled sleep apnea   CERVICAL FUSION  2004   COLONOSCOPY WITH PROPOFOL N/A 07/22/2019   Procedure: COLONOSCOPY WITH BIOPSIES;  Surgeon: Lucilla Lame, MD;  Location: [facility];  Service: Endoscopy;  Laterality: N/A;  Diabetic - diet controlled [facility]   POLYPECTOMY N/A 07/22/2019   Procedure: POLYPECTOMY;  Surgeon: Lucilla Lame, MD;  Location: [facility];  Service: Endoscopy;  Laterality: N/A;    Social History   Tobacco Use   Smoking status: Never   Smokeless tobacco: Never  Vaping Use   Vaping Use: Never used  Substance Use Topics   Alcohol use: No   Drug use: No     Medication list has been reviewed and updated.  Current Meds  Medication Sig   acetaminophen (TYLENOL) 500 MG tablet Take 2 tablets (1,000 mg total) by mouth 2 (two) times daily.   Bisacodyl (LAXATIVE PO) Take by mouth. Generic OTC PRN   buPROPion (WELLBUTRIN XL) 300 MG 24 hr tablet Take 1 tablet (300 mg total) by mouth daily.   CALCIUM CITRATE PO Take 1,200 mg by mouth daily.   cholecalciferol (VITAMIN D3) 25 MCG (1000 UNIT) tablet Take 1,000 Units by mouth daily.   ezetimibe (ZETIA) 10 MG tablet Take 1 tablet by mouth once daily   famotidine (PEPCID) 40 MG tablet Take 1 tablet (40 mg total) by mouth at bedtime.   glucose blood (ONETOUCH ULTRA) test strip Test Blood Sugar twice daily.   Lancets 33G MISC 1 each by Does not apply route daily.   meloxicam (MOBIC) 15 MG tablet Take 1 tablet (15 mg total) by mouth daily.   Multiple Vitamins-Minerals (MULTIVITAMIN ADULTS 50+ PO) Take 1 tablet by mouth daily.   mupirocin ointment (BACTROBAN) 2 % Apply 1 Application topically 2 (two) times daily.   NON FORMULARY CPAP @@ 12  cm H20   nystatin cream (MYCOSTATIN) Apply to affected area 2 times daily   Omega-3 Fatty Acids (FISH OIL) 1000 MG CAPS Take 1 capsule by mouth daily.   REFRESH 1.4-0.6 % SOLN Place 1 drop into both eyes as needed.   [DISCONTINUED] amLODipine (NORVASC) 5 MG tablet Take 1 tablet by mouth once daily   [DISCONTINUED] irbesartan (AVAPRO) 300 MG tablet Take 1 tablet (300 mg total) by mouth daily.       03/09/2022    3:01 PM 12/07/2021    2:33 PM 11/29/2021    8:43 AM 10/13/2021    1:38 PM  GAD 7 : Generalized Anxiety Score  Nervous, Anxious, on Edge 0 0 0 0  Control/stop worrying 0 0 0 0  Worry too much - different things 0 0 0 0  Trouble relaxing 0 0 0 0  Restless 0 0 0 0  Easily annoyed or irritable 0 0 0 0  Afraid - awful might happen 0 0 0 0  Total GAD 7 Score 0 0 0 0  Anxiety Difficulty Not difficult at all Not difficult at all Not difficult at all Not difficult at all       03/09/2022    3:01 PM 12/07/2021    2:33 PM 11/29/2021    8:43 AM  Depression screen PHQ 2/9  Decreased Interest 0 0 0  Down, Depressed, Hopeless 0 0 0  PHQ - 2 Score 0 0 0  Altered sleeping 1 0 1  Tired, decreased energy 0 0 1  Change in appetite 0 0 0  Feeling bad or failure about yourself  0 0 0  Trouble concentrating 0 0 0  Moving slowly or fidgety/restless 0 0 0  Suicidal thoughts 0 0 0  PHQ-9 Score 1 0 2  Difficult doing work/chores Not difficult at all Not difficult at all Not difficult at all    BP Readings from Last 3 Encounters:  03/09/22 126/60  12/07/21 128/70  12/03/21 (!) 143/58    Physical Exam Vitals and nursing note reviewed.  Constitutional:      General: She is not in acute distress.    Appearance: She is well-developed.  HENT:     Head: Normocephalic and atraumatic.  Cardiovascular:     Rate and Rhythm: Normal rate and regular rhythm.     Heart sounds: No murmur heard. Pulmonary:     Effort: Pulmonary effort is normal. No respiratory distress.     Breath sounds: No  wheezing or rhonchi.  Musculoskeletal:     Left hand: Tenderness (of palm with nodule formation) present.     Cervical back: Normal range of motion.     Right lower leg: No edema.     Left lower leg: No edema.  Lymphadenopathy:     Cervical: No cervical adenopathy.  Skin:    General: Skin is warm and dry.     Findings: No rash.  Neurological:     Mental Status: She is alert and oriented to person, place, and time.  Psychiatric:        Mood and Affect: Mood normal.        Behavior: Behavior normal.     Wt Readings from Last 3 Encounters:  03/09/22 205 lb (93 kg)  12/07/21 201 lb (91.2 kg)  11/29/21 202 lb (91.6 kg)    BP 126/60 (BP Location: Left Arm, Cuff Size: Large)   Pulse 76   Ht _0  (1.702 m)   Wt 205 lb (93 kg)   SpO2 97%   BMI 32.11 kg/m   Assessment and Plan: 1. Essential hypertension Clinically stable exam with well controlled BP. Tolerating medications without side effects at this time. Pt to continue current regimen and low sodium diet; benefits of regular exercise as able discussed. - irbesartan (AVAPRO) 300 MG tablet; Take 1 tablet (300 mg total) by mouth daily.  Dispense: 90 tablet; Refill: 1 - amLODipine (NORVASC) 5 MG tablet; Take 1 tablet (5 mg total) by mouth daily.  Dispense: 90 tablet; Refill: 1  2. Type II diabetes mellitus with complication (HCC) Continue diet control. - Basic metabolic panel - Hemoglobin A1c  3. Statin myopathy On Zetia instead  4. Encounter for screening mammogram for breast cancer Schedule at [facility]  5. 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 Any errors are unintentional.  [name], MD [facility]  03/09/2022

## 2022-03-10 LAB — BASIC METABOLIC PANEL
BUN/Creatinine Ratio: 19 (ref 12–28)
BUN: 18 mg/dL (ref 8–27)
CO2: 25 mmol/L (ref 20–29)
Calcium: 10.4 mg/dL — ABNORMAL HIGH (ref 8.7–10.3)
Chloride: 101 mmol/L (ref 96–106)
Creatinine, Ser: 0.96 mg/dL (ref 0.57–1.00)
Glucose: 91 mg/dL (ref 70–99)
Potassium: 4.4 mmol/L (ref 3.5–5.2)
Sodium: 139 mmol/L (ref 134–144)
eGFR: 59 mL/min/{1.73_m2} — ABNORMAL LOW (ref >59)

## 2022-03-10 LAB — HEMOGLOBIN A1C
Est. average glucose Bld gHb Est-mCnc: 151 mg/dL
Hgb A1c MFr Bld: 6.9 % — ABNORMAL HIGH (ref 4.8–5.6)

## 2022-04-01 ENCOUNTER — Telehealth: Payer: Medicare HMO | Admitting: Family Medicine

## 2022-04-01 ENCOUNTER — Ambulatory Visit: Payer: Self-pay

## 2022-04-01 DIAGNOSIS — U071 COVID-19: Secondary | ICD-10-CM | POA: Diagnosis not present

## 2022-04-01 MED ORDER — BENZONATATE 200 MG PO CAPS: 200.0000 mg | ORAL_CAPSULE | Freq: Two times a day (BID) | ORAL | 0 refills | Status: DC | PRN

## 2022-04-01 MED ORDER — NIRMATRELVIR/RITONAVIR (PAXLOVID) TABLET (RENAL DOSING): 2.0000 | ORAL_TABLET | Freq: Two times a day (BID) | ORAL | 0 refills | Status: AC

## 2022-04-01 NOTE — Telephone Encounter (Signed)
Summary: COVID positive every cold, dry cough, phlegm and body aches./Requesting Rx   Pt stated tested positive for COVID yesterday very cold, dry cough, phlegm and body aches. Pt is requested medication.    Pt seeking clinical advice.      Called pt - [id number]

## 2022-04-01 NOTE — Telephone Encounter (Signed)
Summary: COVID positive every cold, dry cough, phlegm and body aches./Requesting Rx   Pt stated tested positive for COVID yesterday very cold, dry cough, phlegm and body aches. Pt is requested medication.    Pt seeking clinical advice.           Chief Complaint: covid positive at home test yesterday 03/31/22. Requesting medication Symptoms: fatigue , dry cough, nasal congestion, chest congestion  hx diabetes  Frequency: yesterday  Pertinent Negatives: Patient denies chest pain no difficulty breathing no fever. No sore throat no sneezing, no loss of taste or smell. Disposition: '[]'$ ED /'[]'$ Urgent Care (no appt availability in office) / '[]'$ Appointment(In office/virtual)/ '[x]'$  [facility]/ '[]'$ Home Care/ '[]'$ Refused Recommended Disposition /'[]'$ [facility]/ '[]'$  Follow-up with PCP Additional Notes:   Scheduled UC VV today          Reason for Disposition  [1] HIGH RISK patient (e.g., weak immune system, age > 47 years, obesity with BMI 30 or higher, pregnant, chronic lung disease or other chronic medical condition) AND [2] COVID symptoms (e.g., cough, fever)  (Exceptions: Already seen by PCP and no new or worsening symptoms.)  Answer Assessment - Initial Assessment Questions 1. COVID-19 DIAGNOSIS: "How do you know that you have COVID?" (e.g., positive lab test or self-test, diagnosed by doctor or NP/PA, symptoms after exposure).     Covid positive yesterday 03/31/22 2. COVID-19 EXPOSURE: "Was there any known exposure to COVID before the symptoms began?" CDC Definition of close contact: within 6 feet (2 meters) for a total of 15 minutes or more over a 24-hour period.      Yes  3. ONSET: "When did the COVID-19 symptoms start?"      03/31/22 4. WORST SYMPTOM: "What is your worst symptom?" (e.g., cough, fever, shortness of breath, muscle aches)     Cough , white , body aches , nasal congestion and throat . 5. COUGH: "Do you have a cough?" If Yes, ask: "How bad is the cough?"        Yes  6. FEVER: "Do you have a fever?" If Yes, ask: "What is your temperature, how was it measured, and when did it start?"     No  7. RESPIRATORY STATUS: "Describe your breathing?" (e.g., normal; shortness of breath, wheezing, unable to speak)      Normal  8. BETTER-SAME-WORSE: "Are you getting better, staying the same or getting worse compared to yesterday?"  If getting worse, ask, "In what way?"     Worse  9. OTHER SYMPTOMS: "Do you have any other symptoms?"  (e.g., chills, fatigue, headache, loss of smell or taste, muscle pain, sore throat)     Fatigue headache , dry cough , body aches . 10. HIGH RISK DISEASE: "Do you have any chronic medical problems?" (e.g., asthma, heart or lung disease, weak immune system, obesity, etc.)       Diabetes  11. VACCINE: "Have you had the COVID-19 vaccine?" If Yes, ask: "Which one, how many shots, when did you get it?"       Yes booster couple of months ago  12. PREGNANCY: "Is there any chance you are pregnant?" "When was your last menstrual period?"       na 13. O2 SATURATION MONITOR:  "Do you use an oxygen saturation monitor (pulse oximeter) at home?" If Yes, ask "What is your reading (oxygen level) today?" "What is your usual oxygen saturation reading?" (e.g., 95%)       na  Protocols used: Coronavirus (COVID-19) Diagnosed or  [place]

## 2022-04-01 NOTE — Progress Notes (Signed)
Virtual Visit Consent   Molly Ruiz, you are scheduled for a virtual visit with a [facility] provider today. Just as with appointments in the office, your consent must be obtained to participate. Your consent will be active for this visit and any virtual visit you may have with one of our providers in the next 365 days. If you have a MyChart account, a copy of this consent can be sent to you electronically.  As this is a virtual visit, video technology does not allow for your provider to perform a traditional examination. This may limit your provider's ability to fully assess your condition. If your provider identifies any concerns that need to be evaluated in person or the need to arrange testing (such as labs, EKG, etc.), we will make arrangements to do so. Although advances in technology are sophisticated, we cannot ensure that it will always work on either your end or our end. If the connection with a video visit is poor, the visit may have to be switched to a telephone visit. With either a video or telephone visit, we are not always able to ensure that we have a secure connection.  By engaging in this virtual visit, you consent to the provision of healthcare and authorize for your insurance to be billed (if applicable) for the services provided during this visit. Depending on your insurance coverage, you may receive a charge related to this service.  I need to obtain your verbal consent now. Are you willing to proceed with your visit today? Molly Ruiz has provided verbal consent on 04/01/2022 for a virtual visit (video or telephone). Dellia Nims, FNP  Date: 04/01/2022 5:32 PM  Virtual Visit via Video Note   I, Dellia Nims, connected with  Molly Ruiz  ([id number], [birth year]) on 04/01/22 at  5:30 PM EST by a video-enabled telemedicine application and verified that I am speaking with the correct person using two identifiers.  Location: Patient: Virtual Visit Location Patient:  Home Provider: Virtual Visit Location Provider: Home Office   I discussed the limitations of evaluation and management by telemedicine and the availability of in person appointments. The patient expressed understanding and agreed to proceed.    History of Present Illness: Molly Ruiz is a 81 y.o. who identifies as a female who was assigned female at birth, and is being seen today for positive covid test yesterday with sx starting yesterday. She has cough, body aches, head congestion, no wheezing sob or fever. Sx worsening.   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 03/09/2022   Stage 3a chronic kidney disease ([place]) 11/29/2021   Acute pain of right shoulder 09/24/2020   Statin myopathy 03/31/2020   Gastroesophageal reflux disease 09/19/2019   Special screening for malignant neoplasms, colon    Tubular adenoma of colon    Anxiety disorder 06/13/2019   Osteopenia determined by x-ray 06/06/2018   Essential hypertension 04/26/2018   Eczema 10/25/2017   Slow transit constipation 02/06/2017   Primary osteoarthritis of left hip 01/12/2017   OSA on CPAP 01/02/2017   Type II diabetes mellitus with complication ([place]) 40/81/4481   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   Hyperlipidemia associated with type 2 diabetes mellitus ([place]) 06/24/2016   Obesity (BMI 30.0-34.9) 06/24/2016   Vitamin D deficiency 06/24/2016   History of shingles 06/24/2016   Allergic rhinitis 06/24/2016    Allergies:  Allergies  Allergen Reactions   Atorvastatin Other (See Comments)    Peripheral neuropathy and myalgia   Influenza Vaccines Rash   Medications:  Current Outpatient Medications:    benzonatate (TESSALON) 200 MG capsule, Take 1 capsule (200 mg total) by mouth 2 (two) times daily as needed for cough., Disp: 20 capsule, Rfl: 0   nirmatrelvir/ritonavir EUA, renal dosing, (PAXLOVID) 10 x 150 MG & 10 x '100MG'$   TABS, Take 2 tablets by mouth 2 (two) times daily for 5 days. (Take nirmatrelvir 150 mg one tablet twice daily for 5 days and ritonavir 100 mg one tablet twice daily for 5 days) Patient GFR is 59, Disp: 20 tablet, Rfl: 0   acetaminophen (TYLENOL) 500 MG tablet, Take 2 tablets (1,000 mg total) by mouth 2 (two) times daily., Disp: 30 tablet, Rfl: 0   amLODipine (NORVASC) 5 MG tablet, Take 1 tablet (5 mg total) by mouth daily., Disp: 90 tablet, Rfl: 1   Bisacodyl (LAXATIVE PO), Take by mouth. Generic OTC PRN, Disp: , Rfl:    buPROPion (WELLBUTRIN XL) 300 MG 24 hr tablet, Take 1 tablet (300 mg total) by mouth daily., Disp: 90 tablet, Rfl: 1   CALCIUM CITRATE PO, Take 1,200 mg by mouth daily., Disp: , Rfl:    cholecalciferol (VITAMIN D3) 25 MCG (1000 UNIT) tablet, Take 1,000 Units by mouth daily., Disp: , Rfl:    ezetimibe (ZETIA) 10 MG tablet, Take 1 tablet by mouth once daily, Disp: 90 tablet, Rfl: 0   famotidine (PEPCID) 40 MG tablet, Take 1 tablet (40 mg total) by mouth at bedtime., Disp: 90 tablet, Rfl: 3   glucose blood (ONETOUCH ULTRA) test strip, Test Blood Sugar twice daily., Disp: 100 each, Rfl: 12   irbesartan (AVAPRO) 300 MG tablet, Take 1 tablet (300 mg total) by mouth daily., Disp: 90 tablet, Rfl: 1   Lancets 33G MISC, 1 each by Does not apply route daily., Disp: 100 each, Rfl: 3   meloxicam (MOBIC) 15 MG tablet, Take 1 tablet (15 mg total) by mouth daily., Disp: 30 tablet, Rfl: 0   Multiple Vitamins-Minerals (MULTIVITAMIN ADULTS 50+ PO), Take 1 tablet by mouth daily., Disp: , Rfl:    mupirocin ointment (BACTROBAN) 2 %, Apply 1 Application topically 2 (two) times daily., Disp: 22 g, Rfl: 3   NON FORMULARY, CPAP @@ 12 cm H20, Disp: , Rfl:    nystatin cream (MYCOSTATIN), Apply to affected area 2 times daily, Disp: 30 g, Rfl: 1   Omega-3 Fatty Acids (FISH OIL) 1000 MG CAPS, Take 1 capsule by mouth daily., Disp: , Rfl:    REFRESH 1.4-0.6 % SOLN, Place 1 drop into both eyes as needed., Disp: ,  Rfl:   Observations/Objective: Patient is well-developed, well-nourished in no acute distress.  Resting comfortably  at home.  Head is normocephalic, atraumatic.  No labored breathing.  Speech is clear and coherent with logical content.  Patient is alert and oriented at baseline.    Assessment and Plan: 1. COVID  Increase fluids, humidifier at night, tylenol, ED for sob and wheezing.   Follow Up Instructions: I discussed the assessment and treatment plan with the patient. The patient was provided an opportunity to ask questions and all were answered. The patient agreed with the plan and demonstrated an understanding of the instructions.  A copy of instructions were sent to the patient via MyChart unless otherwise noted below.     The patient was advised to call back or seek an in-person evaluation if the symptoms worsen  or if the condition fails to improve as anticipated.  Time:  I spent 10 minutes with the patient via telehealth technology discussing the above problems/concerns.    Dellia Nims, FNP

## 2022-04-01 NOTE — Patient Instructions (Signed)
Quarantine and Isolation Quarantine and isolation refer to local and travel restrictions to protect the public and travelers from contagious diseases that constitute a public health threat. Contagious diseases are diseases that can spread from one person to another. Quarantine and isolation help to protect the public by preventing exposure to people who have or may have a contagious disease. Isolation separates people who are sick with a contagious disease from people who are not sick. Quarantine separates and restricts the movement of people who were exposed to a contagious disease to see if they become sick. You may be put in quarantine or isolation if you have been exposed to or diagnosed with any of the following diseases: Severe acute respiratory syndromes, such as COVID-19. Cholera. Diphtheria. Tuberculosis. Plague. Smallpox. Yellow fever. Viral hemorrhagic fevers, such as [place], Ebola, and [place]. When to quarantine or isolate Follow these rules, whether you have been vaccinated or not: Stay home and isolate from others when you are sick with a contagious disease. Isolate when you test positive for a contagious disease, even if you do not have symptoms. Isolate if you are sick and suspect that you may have a contagious disease. If you suspect that you have a contagious disease, get tested. If your test results are negative, you can end your isolation. If your test results are positive, follow the full isolation recommendations as told by your health care provider or local health authorities. Quarantine and stay away from others when you have been in close contact with someone who has tested positive for a contagious disease. Close contact is defined as being less than 6 ft (1.8 m) away from an infected person for a total of 15 minutes or more over a 24-hour period. Do not go to places where you are unable to wear a mask, such as restaurants and some gyms. Stay home and separate  from others as much as possible. Avoid being around people who may get very sick from the contagious disease that you have. Use a separate bathroom, if possible. Do not travel. For travel guidance, visit the CDC's travel webpage at wwwnc.cdc.gov/travel/ Follow these instructions at home: Medicines  Take over-the-counter and prescription medicines as told by your health care provider. Finish all antibiotic medicine even when you start to feel better. Stay up to date with all your vaccines. Get scheduled vaccines and boosters as recommended by your health care provider. Lifestyle Wear a high-quality mask if you must be around others at home and in public, if recommended. Improve air flow (ventilation) at home to help prevent the disease from spreading to other people, if possible. Do not share personal household items, like cups, towels, and utensils. Practice everyday hygiene and cleaning. General instructions Talk to your health care provider if you have a weakened body defense system (immune system). People with a weakened immune system may have a reduced immune response to vaccines. You may need to follow current prevention measures, including wearing a well-fitting mask, avoiding crowds, and avoiding poorly ventilated indoor places. Monitor symptoms and follow health care provider instructions, which may include resting, drinking fluids, and taking medicines. Follow specific isolation and quarantine recommendations if you are in places that can lead to disease outbreaks, such as correctional and detention facilities, homeless shelters, and cruise ships. Return to your normal activities as told by your health care provider. Ask your health care provider what activities are safe for you. Keep all follow-up visits. This is important. Where to find more information CDC: www.cdc.gov/quarantine/index.html Contact   a health care provider if: You have a fever. You have signs and symptoms that  return or get worse after isolation. Get help right away if: You have difficulty breathing. You have chest pain. These symptoms may be an emergency. Get help right away. Call 911. Do not wait to see if the symptoms will go away. Do not drive yourself to the hospital. Summary Isolation and quarantine help protect the public by preventing exposure to people who have or may have a contagious disease. Isolate when you are sick or when you test positive, even if you do not have symptoms. Quarantine and stay away from others when you have been in close contact with someone who has tested positive for a contagious disease. This information is not intended to replace advice given to you by your health care provider. Make sure you discuss any questions you have with your health care provider. Document Revised: 05/06/2021 Document Reviewed: 04/15/2021 Elsevier Patient Education  [place] [id number]-09, or coronavirus disease 2019, is an infection that is caused by a new (novel) coronavirus called SARS-CoV-2. COVID-19 can cause many symptoms. In some people, the virus may not cause any symptoms. In others, it may cause mild or severe symptoms. Some people with severe infection develop severe disease. What are the causes? This illness is caused by a virus. The virus may be in the air as tiny specks of fluid (aerosols) or droplets, or it may be on surfaces. You may catch the virus by: Breathing in droplets from an infected person. Droplets can be spread by a person breathing, speaking, singing, coughing, or sneezing. Touching something, like a table or a doorknob, that has virus on it (is contaminated) and then touching your mouth, nose, or eyes. What increases the risk? Risk for infection: You are more likely to get infected with the COVID-19 virus if: You are within 6 ft (1.8 m) of a person with COVID-19 for 15 minutes or longer. You are providing care for a person who is infected with  COVID-19. You are in close personal contact with other people. Close personal contact includes hugging, kissing, or sharing eating or drinking utensils. Risk for serious illness caused by COVID-19: You are more likely to get seriously ill from the COVID-19 virus if: You have cancer. You have a long-term (chronic) disease, such as: Chronic lung disease. This includes pulmonary embolism, chronic obstructive pulmonary disease, and cystic fibrosis. Long-term disease that lowers your body's ability to fight infection (immunocompromise). Serious cardiac conditions, such as heart failure, coronary artery disease, or cardiomyopathy. Diabetes. Chronic kidney disease. Liver diseases. These include cirrhosis, nonalcoholic fatty liver disease, alcoholic liver disease, or autoimmune hepatitis. You have obesity. You are pregnant or were recently pregnant. You have sickle cell disease. What are the signs or symptoms? Symptoms of this condition can range from mild to severe. Symptoms may appear any time from 2 to 14 days after being exposed to the virus. They include: Fever or chills. Shortness of breath or trouble breathing. Feeling tired or very tired. Headaches, body aches, or muscle aches. Runny or stuffy nose, sneezing, coughing, or sore throat. New loss of taste or smell. This is rare. Some people may also have stomach problems, such as nausea, vomiting, or diarrhea. Other people may not have any symptoms of COVID-19. How is this diagnosed? This condition may be diagnosed by testing samples to check for the COVID-19 virus. The most common tests are the PCR test and the antigen test. Tests may be done  in the lab or at home. They include: Using a swab to take a sample of fluid from the back of your nose and throat (nasopharyngeal fluid), from your nose, or from your throat. Testing a sample of saliva from your mouth. Testing a sample of coughed-up mucus from your lungs (sputum). How is this  treated? Treatment for COVID-19 infection depends on the severity of the condition. Mild symptoms can be managed at home with rest, fluids, and over-the-counter medicines. Serious symptoms may be treated in a hospital intensive care unit (ICU). Treatment in the ICU may include: Supplemental oxygen. Extra oxygen is given through a tube in the nose, a face mask, or a hood. Medicines. These may include: Antivirals, such as monoclonal antibodies. These help your body fight off certain viruses that can cause disease. Anti-inflammatories, such as corticosteroids. These reduce inflammation and suppress the immune system. Antithrombotics. These prevent or treat blood clots, if they develop. Convalescent plasma. This helps boost your immune system, if you have an underlying immunosuppressive condition or are getting immunosuppressive treatments. Prone positioning. This means you will lie on your stomach. This helps oxygen to get into your lungs. Infection control measures. If you are at risk for more serious illness caused by COVID-19, your health care provider may prescribe two long-acting monoclonal antibodies, given together every 6 months. How is this prevented? To protect yourself: Use preventive medicine (pre-exposure prophylaxis). You may get pre-exposure prophylaxis if you have moderate or severe immunocompromise. Get vaccinated. Anyone 75 months old or older who meets guidelines can get a COVID-19 vaccine or vaccine series. This includes people who are pregnant or making breast milk (lactating). Get an added dose of COVID-19 vaccine after your first vaccine or vaccine series if you have moderate to severe immunocompromise. This applies if you have had a solid organ transplant or have been diagnosed with an immunocompromising condition. You should get the added dose 4 weeks after you got the first COVID-19 vaccine or vaccine series. If you get an mRNA vaccine, you will need a 3-dose primary  series. If you get the J&J/Janssen vaccine, you will need a 2-dose primary series, with the second dose being an mRNA vaccine. Talk to your health care provider about getting experimental monoclonal antibodies. This treatment is approved under emergency use authorization to prevent severe illness before or after being exposed to the COVID-19 virus. You may be given monoclonal antibodies if: You have moderate or severe immunocompromise. This includes treatments that lower your immune response. People with immunocompromise may not develop protection against COVID-19 when they are vaccinated. You cannot be vaccinated. You may not get a vaccine if you have a severe allergic reaction to the vaccine or its components. You are not fully vaccinated. You are in a facility where COVID-19 is present and: Are in close contact with a person who is infected with the COVID-19 virus. Are at high risk of being exposed to the COVID-19 virus. You are at risk of illness from new variants of the COVID-19 virus. To protect others: If you have symptoms of COVID-19, take steps to prevent the virus from spreading to others. Stay home. Leave your house only to get medical care. Do not use public transit, if possible. Do not travel while you are sick. Wash your hands often with soap and water for at least 20 seconds. If soap and water are not available, use alcohol-based hand sanitizer. Make sure that all people in your household wash their hands well and often. Cough or sneeze  into a tissue or your sleeve or elbow. Do not cough or sneeze into your hand or into the air. Where to find more information Centers for Disease Control and Prevention: CharmCourses.be World Health Organization: https://www.castaneda.info/ Get help right away if: You have trouble breathing. You have pain or pressure in your chest. You are confused. You have bluish lips and fingernails. You have trouble waking from sleep. You  have symptoms that get worse. These symptoms may be an emergency. Get help right away. Call 911. Do not wait to see if the symptoms will go away. Do not drive yourself to the hospital. Summary COVID-19 is an infection that is caused by a new coronavirus. Sometimes, there are no symptoms. Other times, symptoms range from mild to severe. Some people with a severe COVID-19 infection develop severe disease. The virus that causes COVID-19 can spread from person to person through droplets or aerosols from breathing, speaking, singing, coughing, or sneezing. Mild symptoms of COVID-19 can be managed at home with rest, fluids, and over-the-counter medicines. This information is not intended to replace advice given to you by your health care provider. Make sure you discuss any questions you have with your health care provider. Document Revised: 04/13/2021 Document Reviewed: 04/15/2021 Elsevier Patient Education  [place].

## 2022-04-04 ENCOUNTER — Encounter: Payer: Self-pay | Admitting: Internal Medicine

## 2022-04-04 ENCOUNTER — Encounter: Payer: Medicare HMO | Admitting: Internal Medicine

## 2022-04-04 NOTE — Progress Notes (Deleted)
Date:  04/04/2022   Name:  Molly Ruiz   DOB:  11-15-1940   MRN:  [id number]  This encounter was conducted via video encounter due to the need for social distancing in light of the Covid-19 pandemic.  The patient was correctly identified.  I advised that I am conducting the visit from a secure room in my office at [facility].  The patient is located at home. The limitations of this form of encounter were discussed with the patient and he/she agreed to proceed.  Some vital signs will be absent.  Chief Complaint: No chief complaint on file.  Cough This is a new problem. The current episode started in the past 7 days. The problem has been unchanged. The problem occurs every few minutes. The cough is Non-productive. Associated symptoms include chills, a fever and shortness of breath. Risk factors: Covid positive - seen by UC and given Paxlovid on three days ago. Treatments tried: Covid positive - seen by UC and given Paxlovid on three days ago. The treatment provided no relief (from tessalon perles).    Lab Results  Component Value Date   NA 139 03/09/2022   K 4.4 03/09/2022   CO2 25 03/09/2022   GLUCOSE 91 03/09/2022   BUN 18 03/09/2022   CREATININE 0.96 03/09/2022   CALCIUM 10.4 (H) 03/09/2022   EGFR 59 (L) 03/09/2022   GFRNONAA 55 (L) 11/25/2019   Lab Results  Component Value Date   CHOL 201 (H) 11/02/2021   HDL 64 11/02/2021   LDLCALC 112 (H) 11/02/2021   TRIG 145 11/02/2021   CHOLHDL 3.1 11/02/2021   Lab Results  Component Value Date   TSH 1.220 11/02/2021   Lab Results  Component Value Date   HGBA1C 6.9 (H) 03/09/2022   Lab Results  Component Value Date   WBC 4.4 11/02/2021   HGB 13.1 11/02/2021   HCT 37.1 11/02/2021   MCV 90 11/02/2021   PLT 209 11/02/2021   Lab Results  Component Value Date   ALT 18 11/02/2021   AST 22 11/02/2021   ALKPHOS 104 11/02/2021   BILITOT 0.4 11/02/2021   Lab Results  Component Value Date   VD25OH 37.3  11/02/2021     Review of Systems  Constitutional:  Positive for chills and fever.  Respiratory:  Positive for cough and shortness of breath.     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 03/09/2022   Stage 3a chronic kidney disease ([place]) 11/29/2021   Acute pain of right shoulder 09/24/2020   Statin myopathy 03/31/2020   Gastroesophageal reflux disease 09/19/2019   Special screening for malignant neoplasms, colon    Tubular adenoma of colon    Anxiety disorder 06/13/2019   Osteopenia determined by x-ray 06/06/2018   Essential hypertension 04/26/2018   Eczema 10/25/2017   Slow transit constipation 02/06/2017   Primary osteoarthritis of left hip 01/12/2017   OSA on CPAP 01/02/2017   Type II diabetes mellitus with complication ([place]) 40/98/1191   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   Hyperlipidemia associated with type 2 diabetes mellitus ([place]) 06/24/2016   Obesity (BMI 30.0-34.9) 06/24/2016   Vitamin D deficiency 06/24/2016   History of shingles 06/24/2016   Allergic rhinitis 06/24/2016    Allergies  Allergen Reactions   Atorvastatin Other (See Comments)    Peripheral neuropathy and myalgia   Influenza Vaccines Rash    Past Surgical History:  Procedure Laterality Date   ABDOMINAL HYSTERECTOMY     BILATERAL CARPAL TUNNEL RELEASE     CATARACT EXTRACTION W/PHACO Right 11/12/2018   Procedure: CATARACT EXTRACTION PHACO AND INTRAOCULAR LENS PLACEMENT ([place])  RIGHT DIABETIC;  Surgeon: Eulogio Bear, MD;  Location: [facility];  Service: Ophthalmology;  Laterality: Right;  Diabetic - diet controlled sleep apnea   CERVICAL FUSION  2004   COLONOSCOPY WITH PROPOFOL N/A 07/22/2019   Procedure: COLONOSCOPY WITH BIOPSIES;  Surgeon: Lucilla Lame, MD;  Location: [place];  Service: Endoscopy;  Laterality: N/A;  Diabetic - diet controlled priority [place]   POLYPECTOMY N/A 07/22/2019   Procedure: POLYPECTOMY;  Surgeon: Lucilla Lame, MD;  Location: [facility];  Service: Endoscopy;  Laterality: N/A;    Social History   Tobacco Use   Smoking status: Never   Smokeless tobacco: Never  Vaping Use   Vaping Use: Never used  Substance Use Topics   Alcohol use: No   Drug use: No     Medication list has been reviewed and updated.  No outpatient medications have been marked as taking for the 04/04/22 encounter (Appointment) with Glean Hess, MD.       03/09/2022    3:01 PM 12/07/2021    2:33 PM 11/29/2021    8:43 AM 10/13/2021    1:38 PM  GAD 7 : Generalized Anxiety Score  Nervous, Anxious, on Edge 0 0 0 0  Control/stop worrying 0 0 0 0  Worry too much - different things 0 0 0 0  Trouble relaxing 0 0 0 0  Restless 0 0 0 0  Easily annoyed or irritable 0 0 0 0  Afraid - awful might happen 0 0 0 0  Total GAD 7 Score 0 0 0 0  Anxiety Difficulty Not difficult at all Not difficult at all Not difficult at all Not difficult at all       03/09/2022    3:01 PM 12/07/2021    2:33 PM 11/29/2021    8:43 AM  Depression screen PHQ 2/9  Decreased Interest 0 0 0  Down, Depressed, Hopeless 0 0 0  PHQ - 2 Score 0 0 0  Altered sleeping 1 0 1  Tired, decreased energy 0 0 1  Change in appetite 0 0 0  Feeling bad or failure about yourself  0 0 0  Trouble concentrating 0 0 0  Moving slowly or fidgety/restless 0 0 0  Suicidal thoughts 0 0 0  PHQ-9 Score 1 0 2  Difficult doing work/chores Not difficult at all Not difficult at all Not difficult at all    BP Readings from Last 3 Encounters:  03/09/22 126/60  12/07/21 128/70  12/03/21 (!) 143/58    Physical Exam  Wt Readings from Last 3 Encounters:  03/09/22 205 lb (93 kg)  12/07/21 201 lb (91.2 kg)  11/29/21 202 lb (91.6 kg)    There were no vitals taken for this visit.  Assessment and Plan:

## 2022-04-04 NOTE — Telephone Encounter (Signed)
I did put patient on for this afternoon but I could not reach her to confirm appointment

## 2022-04-05 NOTE — Progress Notes (Signed)
Error

## 2022-04-10 ENCOUNTER — Other Ambulatory Visit: Payer: Self-pay | Admitting: Internal Medicine

## 2022-04-10 DIAGNOSIS — E1169 Type 2 diabetes mellitus with other specified complication: Secondary | ICD-10-CM

## 2022-04-27 DIAGNOSIS — M67442 Ganglion, left hand: Secondary | ICD-10-CM | POA: Diagnosis not present

## 2022-04-27 DIAGNOSIS — Z6831 Body mass index (BMI) 31.0-31.9, adult: Secondary | ICD-10-CM | POA: Diagnosis not present

## 2022-04-27 DIAGNOSIS — E118 Type 2 diabetes mellitus with unspecified complications: Secondary | ICD-10-CM | POA: Diagnosis not present

## 2022-05-10 DIAGNOSIS — M25542 Pain in joints of left hand: Secondary | ICD-10-CM | POA: Diagnosis not present

## 2022-05-10 DIAGNOSIS — E118 Type 2 diabetes mellitus with unspecified complications: Secondary | ICD-10-CM | POA: Diagnosis not present

## 2022-05-13 DIAGNOSIS — G4733 Obstructive sleep apnea (adult) (pediatric): Secondary | ICD-10-CM | POA: Diagnosis not present

## 2022-05-25 ENCOUNTER — Ambulatory Visit: Payer: Medicare HMO

## 2022-05-26 DIAGNOSIS — L280 Lichen simplex chronicus: Secondary | ICD-10-CM | POA: Diagnosis not present

## 2022-05-27 ENCOUNTER — Ambulatory Visit (INDEPENDENT_AMBULATORY_CARE_PROVIDER_SITE_OTHER): Payer: Medicare HMO

## 2022-05-27 NOTE — Progress Notes (Signed)
  I attempted to contact the patient concerning her Virtual AWV appointment scheduled at 3:30 pm. No answer. The message on the voicemail stated, that my call has been forwarded to voicemail.

## 2022-05-27 NOTE — Patient Instructions (Signed)

## 2022-06-02 DIAGNOSIS — M67442 Ganglion, left hand: Secondary | ICD-10-CM | POA: Diagnosis not present

## 2022-06-09 ENCOUNTER — Telehealth: Payer: Self-pay | Admitting: Internal Medicine

## 2022-06-09 HISTORY — PX: HAND SURGERY: SHX662

## 2022-06-09 NOTE — Telephone Encounter (Signed)
Copied from [place] [id number]. Topic: Medicare AWV >> Jun 09, 2022 11:23 AM Devoria Glassing wrote: Reason for CRM: Left message tor patient to schedule Medicare Annual Wellness Visit (AWV) with [place], [place]  Appointment can be an offiice/telephone or virtual visit;  Please call [phone] ask for Juliann Pulse.

## 2022-06-13 DIAGNOSIS — G4733 Obstructive sleep apnea (adult) (pediatric): Secondary | ICD-10-CM | POA: Diagnosis not present

## 2022-06-22 ENCOUNTER — Telehealth: Payer: Self-pay | Admitting: Internal Medicine

## 2022-06-22 NOTE — Telephone Encounter (Signed)
Copied from [place]. Topic: Medicare AWV >> Jun 22, 2022 10:24 AM Devoria Glassing wrote: Reason for CRM: Called patient to confirmed she sched her AWV for this year.  Scheduled for 06/30/2022 at 10:30am, telephone visit w/[facility]

## 2022-06-23 ENCOUNTER — Ambulatory Visit
Admission: RE | Admit: 2022-06-23 | Discharge: 2022-06-23 | Disposition: A | Discharge status: Home / Self Care | Payer: Medicare HMO | Source: Ambulatory Visit | Attending: Internal Medicine | Admitting: Internal Medicine

## 2022-06-23 DIAGNOSIS — Z1231 Encounter for screening mammogram for malignant neoplasm of breast: Secondary | ICD-10-CM | POA: Insufficient documentation

## 2022-06-30 ENCOUNTER — Other Ambulatory Visit: Payer: Self-pay | Admitting: Internal Medicine

## 2022-06-30 ENCOUNTER — Ambulatory Visit (INDEPENDENT_AMBULATORY_CARE_PROVIDER_SITE_OTHER): Payer: Medicare HMO

## 2022-06-30 VITALS — Ht 67.0 in | Wt 205.0 lb

## 2022-06-30 DIAGNOSIS — M1612 Unilateral primary osteoarthritis, left hip: Secondary | ICD-10-CM

## 2022-06-30 DIAGNOSIS — Z0000 Encounter for general adult medical examination without abnormal findings: Secondary | ICD-10-CM | POA: Diagnosis not present

## 2022-06-30 MED ORDER — MELOXICAM 15 MG PO TABS: 15.0000 mg | ORAL_TABLET | Freq: Every day | ORAL | 0 refills | Status: DC

## 2022-06-30 NOTE — Patient Instructions (Signed)
Molly Ruiz , Thank you for taking time to come for your Medicare Wellness Visit. I appreciate your ongoing commitment to your health goals. Please review the following plan we discussed and let me know if I can assist you in the future.   These are the goals we discussed:  Goals      DIET - EAT MORE FRUITS AND VEGETABLES     Increase physical activity     Recommend increasing physical activity to at least 3 days per week     Increase water intake     Recommend to drink 4-5 glasses of water a day.     Monitor and Manage My Blood Sugar-Diabetes Type 2     Timeframe:  Long-Range Goal Priority:  High Start Date:                             Expected End Date:                       Follow Up Date 2 month follow up   - check blood sugar before and after exercise - check blood sugar if I feel it is too high or too low - take the blood sugar meter to all doctor visits    Why is this important?   Checking your blood sugar at home helps to keep it from getting very high or very low.  Writing the results in a diary or log helps the doctor know how to care for you.  Your blood sugar log should have the time, date and the results.  Also, write down the amount of insulin or other medicine that you take.  Other information, like what you ate, exercise done and how you were feeling, will also be helpful.     Notes:      Track and Manage My Blood Pressure-Hypertension     Timeframe:  Long-Range Goal Priority:  High Start Date:                             Expected End Date:                       Follow Up Date 3 month follow up    - check blood pressure daily - write blood pressure results in a log or diary    Why is this important?   You won't feel high blood pressure, but it can still hurt your blood vessels.  High blood pressure can cause heart or kidney problems. It can also cause a stroke.  Making lifestyle changes like losing a little weight or eating less salt will help.  Checking  your blood pressure at home and at different times of the day can help to control blood pressure.  If the doctor prescribes medicine remember to take it the way the doctor ordered.  Call the office if you cannot afford the medicine or if there are questions about it.     Notes:         This is a list of the screening recommended for you and due dates:  Health Maintenance  Topic Date Due   COVID-19 Vaccine (6 - 2023-24 season) 01/07/2022   Hemoglobin A1C  09/07/2022   Yearly kidney health urinalysis for diabetes  11/03/2022   Complete foot exam   11/03/2022   Eye exam for  diabetics  12/17/2022   Yearly kidney function blood test for diabetes  03/10/2023   Mammogram  06/24/2023   Medicare Annual Wellness Visit  07/01/2023   Colon Cancer Screening  07/21/2024   DTaP/Tdap/Td vaccine (2 - Td or Tdap) 06/24/2026   Pneumonia Vaccine  Completed   DEXA scan (bone density measurement)  Completed   Zoster (Shingles) Vaccine  Completed   HPV Vaccine  Aged Out   Flu Shot  Discontinued    Advanced directives: yes  Conditions/risks identified: none  Next appointment: Follow up in one year for your annual wellness visit 07/05/23 @ 2:30 pm by phone   Preventive Care 65 Years and Older, Female Preventive care refers to lifestyle choices and visits with your health care provider that can promote health and wellness. What does preventive care include? A yearly physical exam. This is also called an annual well check. Dental exams once or twice a year. Routine eye exams. Ask your health care provider how often you should have your eyes checked. Personal lifestyle choices, including: Daily care of your teeth and gums. Regular physical activity. Eating a healthy diet. Avoiding tobacco and drug use. Limiting alcohol use. Practicing safe sex. Taking low-dose aspirin every day. Taking vitamin and mineral supplements as recommended by your health care provider. What happens during an annual  well check? The services and screenings done by your health care provider during your annual well check will depend on your age, overall health, lifestyle risk factors, and family history of disease. Counseling  Your health care provider may ask you questions about your: Alcohol use. Tobacco use. Drug use. Emotional well-being. Home and relationship well-being. Sexual activity. Eating habits. History of falls. Memory and ability to understand (cognition). Work and work Statistician. Reproductive health. Screening  You may have the following tests or measurements: Height, weight, and BMI. Blood pressure. Lipid and cholesterol levels. These may be checked every 5 years, or more frequently if you are over 87 years old. Skin check. Lung cancer screening. You may have this screening every year starting at age 16 if you have a 30-pack-year history of smoking and currently smoke or have quit within the past 15 years. Fecal occult blood test (FOBT) of the stool. You may have this test every year starting at age 53. Flexible sigmoidoscopy or colonoscopy. You may have a sigmoidoscopy every 5 years or a colonoscopy every 10 years starting at age 7. Hepatitis C blood test. Hepatitis B blood test. Sexually transmitted disease (STD) testing. Diabetes screening. This is done by checking your blood sugar (glucose) after you have not eaten for a while (fasting). You may have this done every 1-3 years. Bone density scan. This is done to screen for osteoporosis. You may have this done starting at age 37. Mammogram. This may be done every 1-2 years. Talk to your health care provider about how often you should have regular mammograms. Talk with your health care provider about your test results, treatment options, and if necessary, the need for more tests. Vaccines  Your health care provider may recommend certain vaccines, such as: Influenza vaccine. This is recommended every year. Tetanus, diphtheria,  and acellular pertussis (Tdap, Td) vaccine. You may need a Td booster every 10 years. Zoster vaccine. You may need this after age 72. Pneumococcal 13-valent conjugate (PCV13) vaccine. One dose is recommended after age 83. Pneumococcal polysaccharide (PPSV23) vaccine. One dose is recommended after age 18. Talk to your health care provider about which screenings and vaccines you need  and how often you need them. This information is not intended to replace advice given to you by your health care provider. Make sure you discuss any questions you have with your health care provider. Document Released: 05/22/2015 Document Revised: 01/13/2016 Document Reviewed: 02/24/2015 Elsevier Interactive Patient Education  2017 [employer] Prevention in the Home Falls can cause injuries. They can happen to people of all ages. There are many things you can do to make your home safe and to help prevent falls. What can I do on the outside of my home? Regularly fix the edges of walkways and driveways and fix any cracks. Remove anything that might make you trip as you walk through a door, such as a raised step or threshold. Trim any bushes or trees on the path to your home. Use bright outdoor lighting. Clear any walking paths of anything that might make someone trip, such as rocks or tools. Regularly check to see if handrails are loose or broken. Make sure that both sides of any steps have handrails. Any raised decks and porches should have guardrails on the edges. Have any leaves, snow, or ice cleared regularly. Use sand or salt on walking paths during winter. Clean up any spills in your garage right away. This includes oil or grease spills. What can I do in the bathroom? Use night lights. Install grab bars by the toilet and in the tub and shower. Do not use towel bars as grab bars. Use non-skid mats or decals in the tub or shower. If you need to sit down in the shower, use a plastic, non-slip  stool. Keep the floor dry. Clean up any water that spills on the floor as soon as it happens. Remove soap buildup in the tub or shower regularly. Attach bath mats securely with double-sided non-slip rug tape. Do not have throw rugs and other things on the floor that can make you trip. What can I do in the bedroom? Use night lights. Make sure that you have a light by your bed that is easy to reach. Do not use any sheets or blankets that are too big for your bed. They should not hang down onto the floor. Have a firm chair that has side arms. You can use this for support while you get dressed. Do not have throw rugs and other things on the floor that can make you trip. What can I do in the kitchen? Clean up any spills right away. Avoid walking on wet floors. Keep items that you use a lot in easy-to-reach places. If you need to reach something above you, use a strong step stool that has a grab bar. Keep electrical cords out of the way. Do not use floor polish or wax that makes floors slippery. If you must use wax, use non-skid floor wax. Do not have throw rugs and other things on the floor that can make you trip. What can I do with my stairs? Do not leave any items on the stairs. Make sure that there are handrails on both sides of the stairs and use them. Fix handrails that are broken or loose. Make sure that handrails are as long as the stairways. Check any carpeting to make sure that it is firmly attached to the stairs. Fix any carpet that is loose or worn. Avoid having throw rugs at the top or bottom of the stairs. If you do have throw rugs, attach them to the floor with carpet tape. Make sure that you have  a light switch at the top of the stairs and the bottom of the stairs. If you do not have them, ask someone to add them for you. What else can I do to help prevent falls? Wear shoes that: Do not have high heels. Have rubber bottoms. Are comfortable and fit you well. Are closed at the  toe. Do not wear sandals. If you use a stepladder: Make sure that it is fully opened. Do not climb a closed stepladder. Make sure that both sides of the stepladder are locked into place. Ask someone to hold it for you, if possible. Clearly mark and make sure that you can see: Any grab bars or handrails. First and last steps. Where the edge of each step is. Use tools that help you move around (mobility aids) if they are needed. These include: Canes. Walkers. Scooters. Crutches. Turn on the lights when you go into a dark area. Replace any light bulbs as soon as they burn out. Set up your furniture so you have a clear path. Avoid moving your furniture around. If any of your floors are uneven, fix them. If there are any pets around you, be aware of where they are. Review your medicines with your doctor. Some medicines can make you feel dizzy. This can increase your chance of falling. Ask your doctor what other things that you can do to help prevent falls. This information is not intended to replace advice given to you by your health care provider. Make sure you discuss any questions you have with your health care provider. Document Released: 02/19/2009 Document Revised: 10/01/2015 Document Reviewed: 05/30/2014 Elsevier Interactive Patient Education  2017 [employer].

## 2022-06-30 NOTE — Progress Notes (Signed)
I connected with  [name] on 06/30/22 by a audio enabled telemedicine application and verified that I am speaking with the correct person using two identifiers.  Patient Location: Home  Provider Location: Office/Clinic  I discussed the limitations of evaluation and management by telemedicine. The patient expressed understanding and agreed to proceed.  Subjective:   Molly Ruiz is a 82 y.o. female who presents for Medicare Annual (Subsequent) preventive examination.  Review of Systems     Cardiac Risk Factors include: advanced age (>69mn, >>70women);dyslipidemia;obesity (BMI >30kg/m2);hypertension     Objective:    Today's Vitals   06/30/22 1028  PainSc: 5    There is no height or weight on file to calculate BMI.     06/30/2022   10:36 AM 05/24/2021    3:46 PM 05/13/2020   10:22 AM 07/22/2019    8:25 AM 05/13/2019   10:19 AM 04/18/2018   11:10 AM 09/26/2016    3:11 PM  Advanced Directives  Does Patient Have a Medical Advance Directive? Yes Yes Yes Yes No Yes Yes  Type of AParamedicof AOcean CityLiving will [place]lam[place]Living will HNorth [place]Living will Healthcare Power of [place]Living will [place] Does patient want to make changes to medical advance directive? No - Patient declined   No - Patient declined Yes (MAU/Ambulatory/Procedural Areas - Information given)    Copy of [place] Chart? Yes - validated most recent copy scanned in chart (See row information) Yes - validated most recent copy scanned in chart (See row information) Yes - validated most recent copy scanned in chart (See row information) Yes - validated most recent copy scanned in chart (See row information)  No - copy requested No - copy requested    Current Medications (verified) Outpatient Encounter Medications as of 06/30/2022  Medication Sig   acetaminophen (TYLENOL) 500  MG tablet Take 2 tablets (1,000 mg total) by mouth 2 (two) times daily.   amLODipine (NORVASC) 5 MG tablet Take 1 tablet (5 mg total) by mouth daily.   Bisacodyl (LAXATIVE PO) Take by mouth. Generic OTC PRN   buPROPion (WELLBUTRIN XL) 300 MG 24 hr tablet Take 1 tablet (300 mg total) by mouth daily.   CALCIUM CITRATE PO Take 1,200 mg by mouth daily.   cholecalciferol (VITAMIN D3) 25 MCG (1000 UNIT) tablet Take 1,000 Units by mouth daily.   ezetimibe (ZETIA) 10 MG tablet Take 1 tablet by mouth once daily   famotidine (PEPCID) 40 MG tablet Take 1 tablet (40 mg total) by mouth at bedtime.   glucose blood (ONETOUCH ULTRA) test strip Test Blood Sugar twice daily.   irbesartan (AVAPRO) 300 MG tablet Take 1 tablet (300 mg total) by mouth daily.   Lancets 33G MISC 1 each by Does not apply route daily.   meloxicam (MOBIC) 15 MG tablet Take 1 tablet (15 mg total) by mouth daily.   Multiple Vitamins-Minerals (MULTIVITAMIN ADULTS 50+ PO) Take 1 tablet by mouth daily.   mupirocin ointment (BACTROBAN) 2 % Apply 1 Application topically 2 (two) times daily.   NON FORMULARY CPAP @@ 12 cm H20   REFRESH 1.4-0.6 % SOLN Place 1 drop into both eyes as needed.   benzonatate (TESSALON) 200 MG capsule Take 1 capsule (200 mg total) by mouth 2 (two) times daily as needed for cough. (Patient not taking: Reported on 06/30/2022)   nystatin cream (MYCOSTATIN) Apply to affected  area 2 times daily (Patient not taking: Reported on 06/30/2022)   Omega-3 Fatty Acids (FISH OIL) 1000 MG CAPS Take 1 capsule by mouth daily. (Patient not taking: Reported on 06/30/2022)   No facility-administered encounter medications on file as of 06/30/2022.    Allergies (verified) Atorvastatin and Influenza vaccines   History: Past Medical History:  Diagnosis Date   Complication of anesthesia    Hair falls out.   Depression    Diabetes mellitus without complication ([place])    Hyperlipidemia    Hypertension    Sleep apnea    CPAP   Wears  dentures    partial upper   Past Surgical History:  Procedure Laterality Date   ABDOMINAL HYSTERECTOMY     BILATERAL CARPAL TUNNEL RELEASE     CATARACT EXTRACTION W/PHACO Right 11/12/2018   Procedure: CATARACT EXTRACTION PHACO AND INTRAOCULAR LENS PLACEMENT ([place])  RIGHT DIABETIC;  Surgeon: Eulogio Bear, MD;  Location: [facility];  Service: Ophthalmology;  Laterality: Right;  Diabetic - diet controlled sleep apnea   CERVICAL FUSION  2004   COLONOSCOPY WITH PROPOFOL N/A 07/22/2019   Procedure: COLONOSCOPY WITH BIOPSIES;  Surgeon: Lucilla Lame, MD;  Location: [facility];  Service: Endoscopy;  Laterality: N/A;  Diabetic - diet controlled priority [place]   POLYPECTOMY N/A 07/22/2019   Procedure: POLYPECTOMY;  Surgeon: Lucilla Lame, MD;  Location: [facility];  Service: Endoscopy;  Laterality: N/A;   Family History  Problem Relation Age of Onset   Diabetes Mother    Stroke Mother    Heart disease Sister    Stroke Brother    Prostate cancer Brother    Breast cancer Other 14   Social History   Socioeconomic History   Marital status: Widowed    Spouse name: Not on file   Number of children: 3   Years of education: 12   Highest education level: High school graduate  Occupational History   Occupation: Retired  Tobacco Use   Smoking status: Never   Smokeless tobacco: Never  Vaping Use   Vaping Use: Never used  Substance and Sexual Activity   Alcohol use: No   Drug use: No   Sexual activity: Not Currently  Other Topics Concern   Not on file  Social History Narrative   Pt lives with her daughter   Social Determinants of Health   Financial Resource Strain: [place]  (06/30/2022)   Overall Financial Resource Strain (CARDIA)    Difficulty of Paying Living Expenses: Not very hard  Food Insecurity: No Food Insecurity (06/30/2022)   Hunger Vital Sign    Worried About Running Out of Food in the Last Year: Never  true    [place] in the Last Year: Never true  Transportation Needs: No Transportation Needs (06/30/2022)   PRAPARE - Hydrologist (Medical): No    Lack of Transportation (Non-Medical): No  Physical Activity: Insufficiently Active (06/30/2022)   Exercise Vital Sign    Days of Exercise per Week: 2 days    Minutes of Exercise per Session: 20 min  Stress: No Stress Concern Present (06/30/2022)   [place]    Feeling of Stress : Only a little  Social Connections: Moderately Isolated (06/30/2022)   Social Connection and Isolation Panel [NHANES]    Frequency of Communication with Friends and Family: More than three  times a week    Frequency of Social Gatherings with Friends and Family: More than three times a week    Attends Religious Services: More than 4 times per year    Active Member of [employer] or Organizations: No    Attends Archivist Meetings: Never    Marital Status: Widowed    Tobacco Counseling Counseling given: Not Answered   Clinical Intake:  Pre-visit preparation completed: Yes  Pain : 0-10 Pain Score: 5  Pain Type: Chronic pain Pain Location: Back     Nutritional Risks: None Diabetes: No  How often do you need to have someone help you when you read instructions, pamphlets, or other written materials from your doctor or pharmacy?: 1 - Never  Diabetic?no  Interpreter Needed?: No  Information entered by :: Kirke Shaggy, LPN   Activities of Daily Living    06/30/2022   10:37 AM 08/30/2021    4:16 PM  In your present state of health, do you have any difficulty performing the following activities:  Hearing? 0 1  Vision? 0 1  Difficulty concentrating or making decisions? 0 0  Walking or climbing stairs? 1 1  Dressing or bathing? 1 0  Doing errands, shopping? 1 0  Preparing Food and eating ? N   Using the Toilet? N   In the past six months, have you  accidently leaked urine? N   Do you have problems with loss of bowel control? N   Managing your Medications? Y   Managing your Finances? Y   Housekeeping or managing your Housekeeping? Y     Patient Care Team: Glean Hess, MD as PCP - General (Internal Medicine) [facility] (Ophthalmology)  Indicate any recent Medical Services you may have received from other than [facility] providers in the past year (date may be approximate).     Assessment:   This is a routine wellness examination for Yumi.  Hearing/Vision screen Hearing Screening - Comments:: No aids Vision Screening - Comments:: Wears glasses- [facility]  Dietary issues and exercise activities discussed: Current Exercise Habits: Home exercise routine, Type of exercise: Other - see comments (bike), Time (Minutes): 20, Frequency (Times/Week): 2, Weekly Exercise (Minutes/Week): 40, Intensity: Mild   Goals Addressed             This Visit's Progress    DIET - EAT MORE FRUITS AND VEGETABLES         Depression Screen    06/30/2022   10:34 AM 03/09/2022    3:01 PM 12/07/2021    2:33 PM 11/29/2021    8:43 AM 10/13/2021    1:38 PM 09/15/2021    8:49 AM 08/30/2021    4:15 PM  PHQ 2/9 Scores  PHQ - 2 Score 0 0 0 0 0 0 0  PHQ- 9 Score 0 1 0 2 0 5 3    Fall Risk    06/30/2022   10:37 AM 03/09/2022    3:01 PM 12/07/2021    2:32 PM 11/29/2021    8:44 AM 10/13/2021    1:38 PM  [place] in the past year? 0 0 0 0 0  Number falls in past yr: 0 0 0 0 0  Injury with Fall? 0 0 0 0 0  Risk for fall due to : No Fall Risks No Fall Risks No Fall Risks No Fall Risks No Fall Risks  Follow up Falls prevention discussed;Falls evaluation completed Falls evaluation completed Falls evaluation completed Falls  evaluation completed Falls evaluation completed    FALL RISK PREVENTION PERTAINING TO THE HOME:  Any stairs in or around the home? No  If so, are there any without handrails? No  Home free of loose throw rugs  in walkways, pet beds, electrical cords, etc? Yes  Adequate lighting in your home to reduce risk of falls? Yes   ASSISTIVE DEVICES UTILIZED TO PREVENT FALLS:  Life alert? No  Use of a cane, walker or w/c? Yes  Grab bars in the bathroom? No  Shower chair or bench in shower? No  Elevated toilet seat or a handicapped toilet? No   Cognitive Function:        06/30/2022   10:38 AM 05/13/2020   10:26 AM 05/13/2019   10:26 AM 04/18/2018   12:21 PM 09/26/2016    3:12 PM  6CIT Screen  What Year? 0 points 0 points 0 points 0 points 4 points  What month? 0 points 0 points 0 points 0 points 0 points  What time? 0 points 0 points 0 points 0 points 0 points  Count back from 20 0 points 0 points 0 points 0 points 0 points  Months in reverse 2 points 0 points 0 points 0 points 0 points  Repeat phrase 0 points 0 points 2 points 2 points 2 points  Total Score 2 points 0 points 2 points 2 points 6 points    Immunizations Immunization History  Administered Date(s) Administered   PFIZER(Purple Top)SARS-COV-2 Vaccination 07/04/2019, 07/30/2019, 03/21/2020   PNEUMOCOCCAL CONJUGATE-20 02/03/2022   Pfizer Covid-19 Vaccine Bivalent Booster 22yr & up 10/25/2020   Pfizer Covid-19 Vaccine Bivalent Booster 5y-11y 10/29/2021   Pneumococcal Conjugate-13 06/24/2016   Pneumococcal Polysaccharide-23 08/23/2017   Tdap 06/24/2016   Zoster Recombinat (Shingrix) 10/29/2021, 02/03/2022    TDAP status: Up to date  Flu Vaccine status: Declined, Education has been provided regarding the importance of this vaccine but patient still declined. Advised may receive this vaccine at local pharmacy or Health Dept. Aware to provide a copy of the vaccination record if obtained from local pharmacy or Health Dept. Verbalized acceptance and understanding.  Pneumococcal vaccine status: Declined,  Education has been provided regarding the importance of this vaccine but patient still declined. Advised may receive this vaccine at  local pharmacy or Health Dept. Aware to provide a copy of the vaccination record if obtained from local pharmacy or Health Dept. Verbalized acceptance and understanding.   Covid-19 vaccine status: Completed vaccines  Qualifies for Shingles Vaccine? Yes   Zostavax completed No   Shingrix Completed?: Yes  Screening Tests Health Maintenance  Topic Date Due   COVID-19 Vaccine (6 - 2023-24 season) 01/07/2022   HEMOGLOBIN A1C  09/07/2022   Diabetic kidney evaluation - Urine ACR  11/03/2022   FOOT EXAM  11/03/2022   OPHTHALMOLOGY EXAM  12/17/2022   Diabetic kidney evaluation - eGFR measurement  03/10/2023   MAMMOGRAM  06/24/2023   Medicare Annual Wellness (AWV)  07/01/2023   COLONOSCOPY (Pts 45-443yrInsurance coverage will need to be confirmed)  07/21/2024   DTaP/Tdap/Td (2 - Td or Tdap) 06/24/2026   Pneumonia Vaccine 6522Years old  Completed   DEXA SCAN  Completed   Zoster Vaccines- Shingrix  Completed   HPV VACCINES  Aged Out   INFLUENZA VACCINE  Discontinued    Health Maintenance  Health Maintenance Due  Topic Date Due   COVID-19 Vaccine (6 - 2023-24 season) 01/07/2022    Colorectal cancer screening: No longer required.   Mammogram  status: No longer required due to age.  Bone Density status: Completed 06/08/20. Results reflect: Bone density results: OSTEOPENIA. Repeat every 5 years.  Lung Cancer Screening: (Low Dose CT Chest recommended if Age 28-80 years, 30 pack-year currently smoking OR have quit w/in 15years.) does not qualify.   Additional Screening:  Hepatitis C Screening: does not qualify; Completed no  Vision Screening: Recommended annual ophthalmology exams for early detection of glaucoma and other disorders of the eye. Is the patient up to date with their annual eye exam?  Yes  Who is the provider or what is the name of the office in which the patient attends annual eye exams? Dr.King  If pt is not established with a provider, would they like to be referred to  a provider to establish care? No .   Dental Screening: Recommended annual dental exams for proper oral hygiene  Community Resource Referral / Chronic Care Management: CRR required this visit?  No   CCM required this visit?  No      Plan:     I have personally reviewed and noted the following in the patient's chart:   Medical and social history Use of alcohol, tobacco or illicit drugs  Current medications and supplements including opioid prescriptions. Patient is not currently taking opioid prescriptions. Functional ability and status Nutritional status Physical activity Advanced directives List of other physicians Hospitalizations, surgeries, and ER visits in previous 12 months Vitals Screenings to include cognitive, depression, and falls Referrals and appointments  In addition, I have reviewed and discussed with patient certain preventive protocols, quality metrics, and best practice recommendations. A written personalized care plan for preventive services as well as general preventive health recommendations were provided to patient.     Dionisio David, LPN   [id number]   Nurse Notes: none

## 2022-07-08 ENCOUNTER — Ambulatory Visit (INDEPENDENT_AMBULATORY_CARE_PROVIDER_SITE_OTHER): Payer: Medicare HMO | Admitting: Internal Medicine

## 2022-07-08 ENCOUNTER — Encounter: Payer: Self-pay | Admitting: Internal Medicine

## 2022-07-08 VITALS — BP 126/60 | HR 84 | Ht 67.0 in | Wt 201.2 lb

## 2022-07-08 DIAGNOSIS — E118 Type 2 diabetes mellitus with unspecified complications: Secondary | ICD-10-CM | POA: Diagnosis not present

## 2022-07-08 DIAGNOSIS — E785 Hyperlipidemia, unspecified: Secondary | ICD-10-CM | POA: Diagnosis not present

## 2022-07-08 DIAGNOSIS — F419 Anxiety disorder, unspecified: Secondary | ICD-10-CM | POA: Diagnosis not present

## 2022-07-08 DIAGNOSIS — I10 Essential (primary) hypertension: Secondary | ICD-10-CM

## 2022-07-08 DIAGNOSIS — E1169 Type 2 diabetes mellitus with other specified complication: Secondary | ICD-10-CM

## 2022-07-08 DIAGNOSIS — R69 Illness, unspecified: Secondary | ICD-10-CM | POA: Diagnosis not present

## 2022-07-08 DIAGNOSIS — N1831 Chronic kidney disease, stage 3a: Secondary | ICD-10-CM | POA: Diagnosis not present

## 2022-07-08 LAB — POCT GLYCOSYLATED HEMOGLOBIN (HGB A1C): Hemoglobin A1C: 7 % — AB (ref 4.0–5.6)

## 2022-07-08 MED ORDER — BUPROPION HCL ER (XL) 300 MG PO TB24: 300.0000 mg | ORAL_TABLET | Freq: Every day | ORAL | 1 refills | Status: DC

## 2022-07-08 NOTE — Assessment & Plan Note (Signed)
Clinically stable exam with well controlled BP on amlodipine and irbesartan. Tolerating medications without side effects. Pt to continue current regimen and low sodium diet.

## 2022-07-08 NOTE — Assessment & Plan Note (Addendum)
Clinically stable without s/s of hypoglycemia. Blood sugars are slightly higher. Lab Results  Component Value Date   HGBA1C 6.9 (H) 03/09/2022  A1C today is up to 7.0 Would start metformin but she wants one more visit to work in diet changes

## 2022-07-08 NOTE — Assessment & Plan Note (Signed)
Stable GFR Continue to monitor

## 2022-07-08 NOTE — Progress Notes (Signed)
Date:  07/08/2022   Name:  Molly Ruiz   DOB:  Jan 07, 1941   MRN:  [id number]   Chief Complaint: Diabetes and Hypertension  Diabetes She presents for her follow-up diabetic visit. She has type 2 diabetes mellitus. Her disease course has been stable. Pertinent negatives for hypoglycemia include no headaches or tremors. Pertinent negatives for diabetes include no chest pain, no fatigue, no polydipsia and no polyuria. Current diabetic treatment includes diet. Her breakfast blood glucose is taken between 6-7 am. Her breakfast blood glucose range is generally 140-180 mg/dl. An ACE inhibitor/angiotensin II receptor blocker is being taken. Eye exam is current.  Hypertension Pertinent negatives include no chest pain, headaches, palpitations or shortness of breath.    Lab Results  Component Value Date   NA 139 03/09/2022   K 4.4 03/09/2022   CO2 25 03/09/2022   GLUCOSE 91 03/09/2022   BUN 18 03/09/2022   CREATININE 0.96 03/09/2022   CALCIUM 10.4 (H) 03/09/2022   EGFR 59 (L) 03/09/2022   GFRNONAA 55 (L) 11/25/2019   Lab Results  Component Value Date   CHOL 201 (H) 11/02/2021   HDL 64 11/02/2021   LDLCALC 112 (H) 11/02/2021   TRIG 145 11/02/2021   CHOLHDL 3.1 11/02/2021   Lab Results  Component Value Date   TSH 1.220 11/02/2021   Lab Results  Component Value Date   HGBA1C 7.0 (A) 07/08/2022   Lab Results  Component Value Date   WBC 4.4 11/02/2021   HGB 13.1 11/02/2021   HCT 37.1 11/02/2021   MCV 90 11/02/2021   PLT 209 11/02/2021   Lab Results  Component Value Date   ALT 18 11/02/2021   AST 22 11/02/2021   ALKPHOS 104 11/02/2021   BILITOT 0.4 11/02/2021   Lab Results  Component Value Date   VD25OH 37.3 11/02/2021     Review of Systems  Constitutional:  Negative for appetite change, fatigue, fever and unexpected weight change.  HENT:  Negative for tinnitus and trouble swallowing.   Eyes:  Negative for visual disturbance.  Respiratory:  Negative for cough,  chest tightness and shortness of breath.   Cardiovascular:  Negative for chest pain, palpitations and leg swelling.  Gastrointestinal:  Negative for abdominal pain.  Endocrine: Negative for polydipsia and polyuria.  Genitourinary:  Negative for dysuria and hematuria.  Musculoskeletal:  Negative for arthralgias.  Neurological:  Negative for tremors, numbness and headaches.  Psychiatric/Behavioral:  Negative for dysphoric mood.     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 03/09/2022   Stage 3a chronic kidney disease ([place]) 11/29/2021   Acute pain of right shoulder 09/24/2020   Statin myopathy 03/31/2020   Gastroesophageal reflux disease 09/19/2019   Special screening for malignant neoplasms, colon    Tubular adenoma of colon    Anxiety disorder 06/13/2019   Osteopenia determined by x-ray 06/06/2018   Essential hypertension 04/26/2018   Eczema 10/25/2017   Slow transit constipation 02/06/2017   Primary osteoarthritis of left hip 01/12/2017   OSA on CPAP 01/02/2017   Type II diabetes mellitus with complication ([place]) [id number]   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   Hyperlipidemia associated with type 2 diabetes mellitus ([place]) 06/24/2016   Obesity (BMI 30.0-34.9) 06/24/2016   Vitamin D deficiency 06/24/2016   History of shingles 06/24/2016   Allergic rhinitis 06/24/2016    Allergies  Allergen Reactions   Atorvastatin Other (See Comments)  Peripheral neuropathy and myalgia   Influenza Vaccines Rash    Past Surgical History:  Procedure Laterality Date   ABDOMINAL HYSTERECTOMY     BILATERAL CARPAL TUNNEL RELEASE     CATARACT EXTRACTION W/PHACO Right 11/12/2018   Procedure: CATARACT EXTRACTION PHACO AND INTRAOCULAR LENS PLACEMENT ([place])  RIGHT DIABETIC;  Surgeon: Eulogio Bear, MD;  Location: [facility];  Service: Ophthalmology;  Laterality: Right;  Diabetic - diet controlled sleep  apnea   CERVICAL FUSION  2004   COLONOSCOPY WITH PROPOFOL N/A 07/22/2019   Procedure: COLONOSCOPY WITH BIOPSIES;  Surgeon: Lucilla Lame, MD;  Location: [place];  Service: Endoscopy;  Laterality: N/A;  Diabetic - diet controlled priority 4   FOOT SURGERY     HAND SURGERY Left 06/2022   palmar cyst removed   [place]   POLYPECTOMY N/A 07/22/2019   Procedure: POLYPECTOMY;  Surgeon: Lucilla Lame, MD;  Location: [facility] [place];  Service: Endoscopy;  Laterality: N/A;    Social History   Tobacco Use   Smoking status: Never   Smokeless tobacco: Never  Vaping Use   Vaping Use: Never used  Substance Use Topics   Alcohol use: No   Drug use: No     Medication list has been reviewed and updated.  Current Meds  Medication Sig   acetaminophen (TYLENOL) 500 MG tablet Take 2 tablets (1,000 mg total) by mouth 2 (two) times daily.   amLODipine (NORVASC) 5 MG tablet Take 1 tablet (5 mg total) by mouth daily.   Bisacodyl (LAXATIVE PO) Take by mouth. Generic OTC PRN   CALCIUM CITRATE PO Take 1,200 mg by mouth daily.   cholecalciferol (VITAMIN D3) 25 MCG (1000 UNIT) tablet Take 1,000 Units by mouth daily.   ezetimibe (ZETIA) 10 MG tablet Take 1 tablet by mouth once daily   famotidine (PEPCID) 40 MG tablet Take 1 tablet (40 mg total) by mouth at bedtime.   glucose blood (ONETOUCH ULTRA) test strip Test Blood Sugar twice daily.   irbesartan (AVAPRO) 300 MG tablet Take 1 tablet (300 mg total) by mouth daily.   Lancets 33G MISC 1 each by Does not apply route daily.   meloxicam (MOBIC) 15 MG tablet Take 1 tablet (15 mg total) by mouth daily.   Multiple Vitamins-Minerals (MULTIVITAMIN ADULTS 50+ PO) Take 1 tablet by mouth daily.   mupirocin ointment (BACTROBAN) 2 % Apply 1 Application topically 2 (two) times daily.   NON FORMULARY CPAP @@ 12 cm H20   nystatin cream (MYCOSTATIN) Apply to affected area 2 times daily   Omega-3 Fatty Acids (FISH OIL) 1000 MG CAPS Take 1  capsule by mouth daily.   REFRESH 1.4-0.6 % SOLN Place 1 drop into both eyes as needed.   [DISCONTINUED] benzonatate (TESSALON) 200 MG capsule Take 1 capsule (200 mg total) by mouth 2 (two) times daily as needed for cough.   [DISCONTINUED] buPROPion (WELLBUTRIN XL) 300 MG 24 hr tablet Take 1 tablet (300 mg total) by mouth daily.       07/08/2022   10:47 AM 03/09/2022    3:01 PM 12/07/2021    2:33 PM 11/29/2021    8:43 AM  GAD 7 : Generalized Anxiety Score  Nervous, Anxious, on Edge 0 0 0 0  Control/stop worrying 0 0 0 0  Worry too much - different things 0 0 0 0  Trouble relaxing 0 0 0 0  Restless 0 0 0 0  Easily annoyed or irritable 0 0 0  0  Afraid - awful might happen 0 0 0 0  Total GAD 7 Score 0 0 0 0  Anxiety Difficulty Not difficult at all Not difficult at all Not difficult at all Not difficult at all       07/08/2022   10:46 AM 06/30/2022   10:34 AM 03/09/2022    3:01 PM  Depression screen PHQ 2/9  Decreased Interest 0 0 0  Down, Depressed, Hopeless 0 0 0  PHQ - 2 Score 0 0 0  Altered sleeping 1 0 1  Tired, decreased energy 1 0 0  Change in appetite 0 0 0  Feeling bad or failure about yourself  0 0 0  Trouble concentrating 0 0 0  Moving slowly or fidgety/restless 0 0 0  Suicidal thoughts 0 0 0  PHQ-9 Score 2 0 1  Difficult doing work/chores Not difficult at all Not difficult at all Not difficult at all    BP Readings from Last 3 Encounters:  07/08/22 126/60  03/09/22 126/60  12/07/21 128/70    Physical Exam Vitals and nursing note reviewed.  Constitutional:      General: She is not in acute distress.    Appearance: She is well-developed.  HENT:     Head: Normocephalic and atraumatic.  Cardiovascular:     Rate and Rhythm: Normal rate and regular rhythm.     Pulses: Normal pulses.  Pulmonary:     Effort: Pulmonary effort is normal. No respiratory distress.     Breath sounds: No wheezing or rhonchi.  Musculoskeletal:     Right lower leg: No edema.     Left  lower leg: No edema.  Lymphadenopathy:     Cervical: No cervical adenopathy.  Skin:    General: Skin is warm and dry.     Findings: No rash.  Neurological:     Mental Status: She is alert and oriented to person, place, and time.  Psychiatric:        Mood and Affect: Mood normal.        Behavior: Behavior normal.     Wt Readings from Last 3 Encounters:  07/08/22 201 lb 3.2 oz (91.3 kg)  06/30/22 205 lb (93 kg)  03/09/22 205 lb (93 kg)    BP 126/60   Pulse 84   Ht '5\' 7"'$  (1.702 m)   Wt 201 lb 3.2 oz (91.3 kg)   SpO2 98%   BMI 31.51 kg/m   Assessment and Plan: Problem List Items Addressed This Visit       Cardiovascular and Mediastinum   Essential hypertension (Chronic)    Clinically stable exam with well controlled BP on amlodipine and irbesartan. Tolerating medications without side effects. Pt to continue current regimen and low sodium diet.         Endocrine   Hyperlipidemia associated with type 2 diabetes mellitus (HCC) (Chronic)   Type II diabetes mellitus with complication (HCC) - Primary (Chronic)    Clinically stable without s/s of hypoglycemia. Blood sugars are slightly higher. Lab Results  Component Value Date   HGBA1C 6.9 (H) 03/09/2022  A1C today is up to 7.0 Would start metformin but she wants one more visit to work in diet changes       Relevant Orders   POCT glycosylated hemoglobin (Hb A1C) (Completed)     Genitourinary   Stage 3a chronic kidney disease (HCC) (Chronic)    Stable GFR Continue to monitor        Other   Anxiety  disorder   Relevant Medications   buPROPion (WELLBUTRIN XL) 300 MG 24 hr tablet     Partially dictated using Editor, commissioning. Any errors are unintentional.  [name], MD [facility]  07/08/2022

## 2022-07-12 DIAGNOSIS — G4733 Obstructive sleep apnea (adult) (pediatric): Secondary | ICD-10-CM | POA: Diagnosis not present

## 2022-07-22 ENCOUNTER — Telehealth: Payer: Self-pay | Admitting: Internal Medicine

## 2022-07-22 NOTE — Telephone Encounter (Signed)
Copied from [place] [id number]. Topic: Referral - Request for Referral >> Jul 22, 2022  2:42 PM Chapman Fitch wrote: Has patient seen PCP for this complaint? No  *If NO, is insurance requiring patient see PCP for this issue before PCP can refer them? Referral for which specialty: Dietician  Preferred provider/office:  Reason for referral: eating correct dit due to kidney damage

## 2022-07-22 NOTE — Telephone Encounter (Signed)
Please review.  KP

## 2022-07-25 DIAGNOSIS — G4733 Obstructive sleep apnea (adult) (pediatric): Secondary | ICD-10-CM | POA: Diagnosis not present

## 2022-07-25 NOTE — Telephone Encounter (Signed)
Called, spoke with patient about provider's recommendations, about not seeing a dietician. Patient verbal understanding.

## 2022-07-25 NOTE — Telephone Encounter (Signed)
Called pt left VM to call back.  KP 

## 2022-07-26 ENCOUNTER — Other Ambulatory Visit: Payer: Self-pay | Admitting: Internal Medicine

## 2022-07-26 DIAGNOSIS — E118 Type 2 diabetes mellitus with unspecified complications: Secondary | ICD-10-CM

## 2022-07-27 NOTE — Telephone Encounter (Signed)
Requested Prescriptions  Pending Prescriptions Disp Refills   OneTouch Delica Lancets [id number] [place] [Pharmacy Med Name: Jonetta Speak LAN [id number] MIS] [id number] each 0    Sig: USE 1  LANCET TO CHECK GLUCOSE ONCE DAILY     Endocrinology: Diabetes - Testing Supplies Passed - 07/26/2022 10:32 AM      Passed - Valid encounter within last 12 months    Recent Outpatient Visits           2 weeks ago Type II diabetes mellitus with complication [facility])   [place] [facility] at [facility], Jesse Sans, MD   4 months ago Essential hypertension   [place] [facility] at [facility], Jesse Sans, MD   7 months ago Candidiasis of skin   [place] at [place], Deanna C, MD   8 months ago Stage 3a chronic kidney disease [facility])   [place] at [facility], Jesse Sans, MD   8 months ago Annual physical exam   [place] at [facility] Jesse Sans, MD       Future Appointments             In 3 months [facility] Melia, Jesse Sans, MD [place] at [facility], [facility]

## 2022-09-07 ENCOUNTER — Other Ambulatory Visit: Payer: Self-pay | Admitting: Internal Medicine

## 2022-09-07 DIAGNOSIS — I10 Essential (primary) hypertension: Secondary | ICD-10-CM

## 2022-10-05 ENCOUNTER — Other Ambulatory Visit: Payer: Self-pay | Admitting: Internal Medicine

## 2022-10-05 DIAGNOSIS — E1169 Type 2 diabetes mellitus with other specified complication: Secondary | ICD-10-CM

## 2022-10-06 NOTE — Telephone Encounter (Signed)
Requested Prescriptions  Pending Prescriptions Disp Refills   ezetimibe (ZETIA) 10 MG tablet [Pharmacy Med Name: Ezetimibe 10 MG Oral Tablet] 90 tablet 0    Sig: Take 1 tablet by mouth once daily     Cardiovascular:  Antilipid - Sterol Transport Inhibitors Failed - 10/05/2022 10:43 PM      Failed - Lipid Panel in normal range within the last 12 months    Cholesterol, Total  Date Value Ref Range Status  11/02/2021 201 (H) 100 - 199 mg/dL Final   LDL Chol Calc (NIH)  Date Value Ref Range Status  11/02/2021 112 (H) 0 - 99 mg/dL Final   HDL  Date Value Ref Range Status  11/02/2021 64 >39 mg/dL Final   Triglycerides  Date Value Ref Range Status  11/02/2021 145 0 - 149 mg/dL Final         Passed - AST in normal range and within 360 days    AST  Date Value Ref Range Status  11/02/2021 22 0 - 40 IU/L Final         Passed - ALT in normal range and within 360 days    ALT  Date Value Ref Range Status  11/02/2021 18 0 - 32 IU/L Final         Passed - Patient is not pregnant      Passed - Valid encounter within last 12 months    Recent Outpatient Visits           3 months ago Type II diabetes mellitus with complication (HCC)   [place] [facility] at [facility], Nyoka Cowden, MD   7 months ago Essential hypertension   [place] [facility] at [facility], Nyoka Cowden, MD   10 months ago Candidiasis of skin   [place] [facility] at [facility], MD   10 months ago Stage 3a chronic kidney disease [facility])   [place] [facility] at [facility], Nyoka Cowden, MD   11 months ago Annual physical exam   [facility] at [facility], Nyoka Cowden, MD       Future Appointments             In 1 month Judithann Graves, Nyoka Cowden, MD [facility] at [facility], [facility]

## 2022-11-08 ENCOUNTER — Ambulatory Visit (INDEPENDENT_AMBULATORY_CARE_PROVIDER_SITE_OTHER): Payer: Medicare HMO | Admitting: Internal Medicine

## 2022-11-08 ENCOUNTER — Encounter: Payer: Self-pay | Admitting: Internal Medicine

## 2022-11-08 VITALS — BP 122/68 | HR 78 | Ht 67.0 in | Wt 201.0 lb

## 2022-11-08 DIAGNOSIS — T466X5A Adverse effect of antihyperlipidemic and antiarteriosclerotic drugs, initial encounter: Secondary | ICD-10-CM

## 2022-11-08 DIAGNOSIS — I10 Essential (primary) hypertension: Secondary | ICD-10-CM

## 2022-11-08 DIAGNOSIS — E785 Hyperlipidemia, unspecified: Secondary | ICD-10-CM

## 2022-11-08 DIAGNOSIS — E1169 Type 2 diabetes mellitus with other specified complication: Secondary | ICD-10-CM

## 2022-11-08 DIAGNOSIS — S80211A Abrasion, right knee, initial encounter: Secondary | ICD-10-CM | POA: Diagnosis not present

## 2022-11-08 DIAGNOSIS — E118 Type 2 diabetes mellitus with unspecified complications: Secondary | ICD-10-CM

## 2022-11-08 DIAGNOSIS — Z0000 Encounter for general adult medical examination without abnormal findings: Secondary | ICD-10-CM | POA: Diagnosis not present

## 2022-11-08 DIAGNOSIS — S60221A Contusion of right hand, initial encounter: Secondary | ICD-10-CM | POA: Diagnosis not present

## 2022-11-08 DIAGNOSIS — G720 Drug-induced myopathy: Secondary | ICD-10-CM

## 2022-11-08 NOTE — Progress Notes (Signed)
Date:  11/08/2022   Name:  Molly Ruiz   DOB:  1941-04-17   MRN:  [id number]   Chief Complaint: Annual Exam Molly Ruiz is a 82 y.o. female who presents today for her Complete Annual Exam. She feels poorly. She reports exercising. She reports she is sleeping well. Breast complaints - none.  She fell 2 weeks ago - bruised her right hand and skinned her right knee.  Both are healing well.  Hand is still sore.  Mammogram: 06/2022 DEXA: 05/2020 osteopenia forearm Colonoscopy: 2021  Health Maintenance Due  Topic Date Due   COVID-19 Vaccine (6 - 2023-24 season) 01/07/2022   Diabetic kidney evaluation - Urine ACR  11/03/2022    Immunization History  Administered Date(s) Administered   PFIZER(Purple Top)SARS-COV-2 Vaccination 07/04/2019, 07/30/2019, 03/21/2020   PNEUMOCOCCAL CONJUGATE-20 02/03/2022   Pfizer Covid-19 Vaccine Bivalent Booster 73yrs & up 10/25/2020   Pfizer Covid-19 Vaccine Bivalent Booster 5y-11y 10/29/2021   Pneumococcal Conjugate-13 06/24/2016   Pneumococcal Polysaccharide-23 08/23/2017   Tdap 06/24/2016   Zoster Recombinant(Shingrix) 10/29/2021, 02/03/2022    Hypertension This is a chronic problem. The problem is controlled. Pertinent negatives include no chest pain, headaches, palpitations or shortness of breath.  Diabetes She presents for her follow-up diabetic visit. She has type 2 diabetes mellitus. Her disease course has been stable. Pertinent negatives for hypoglycemia include no dizziness, headaches, nervousness/anxiousness or tremors. Pertinent negatives for diabetes include no chest pain, no fatigue, no polydipsia and no polyuria. Current diabetic treatment includes diet.  Hyperlipidemia This is a chronic problem. Recent lipid tests were reviewed and are variable. Pertinent negatives include no chest pain or shortness of breath. Current antihyperlipidemic treatment includes ezetimibe. The current treatment provides moderate improvement of lipids.    Lab  Results  Component Value Date   NA 139 03/09/2022   K 4.4 03/09/2022   CO2 25 03/09/2022   GLUCOSE 91 03/09/2022   BUN 18 03/09/2022   CREATININE 0.96 03/09/2022   CALCIUM 10.4 (H) 03/09/2022   EGFR 59 (L) 03/09/2022   GFRNONAA 55 (L) 11/25/2019   Lab Results  Component Value Date   CHOL 201 (H) 11/02/2021   HDL 64 11/02/2021   LDLCALC 112 (H) 11/02/2021   TRIG 145 11/02/2021   CHOLHDL 3.1 11/02/2021   Lab Results  Component Value Date   TSH 1.220 11/02/2021   Lab Results  Component Value Date   HGBA1C 7.0 (A) 07/08/2022   Lab Results  Component Value Date   WBC 4.4 11/02/2021   HGB 13.1 11/02/2021   HCT 37.1 11/02/2021   MCV 90 11/02/2021   PLT 209 11/02/2021   Lab Results  Component Value Date   ALT 18 11/02/2021   AST 22 11/02/2021   ALKPHOS 104 11/02/2021   BILITOT 0.4 11/02/2021   Lab Results  Component Value Date   VD25OH 37.3 11/02/2021     Review of Systems  Constitutional:  Negative for chills, fatigue and fever.  HENT:  Negative for congestion, hearing loss, tinnitus, trouble swallowing and voice change.   Eyes:  Negative for visual disturbance.  Respiratory:  Negative for cough, chest tightness, shortness of breath and wheezing.   Cardiovascular:  Negative for chest pain, palpitations and leg swelling.  Gastrointestinal:  Negative for abdominal pain, constipation, diarrhea and vomiting.  Endocrine: Negative for polydipsia and polyuria.  Genitourinary:  Negative for dysuria, frequency, genital sores, vaginal bleeding and vaginal discharge.  Musculoskeletal:  Negative for arthralgias, gait problem and joint swelling.  Right hand soreness  Skin:  Negative for color change and rash.  Neurological:  Negative for dizziness, tremors, light-headedness and headaches.  Hematological:  Negative for adenopathy. Does not bruise/bleed easily.  Psychiatric/Behavioral:  Negative for dysphoric mood and sleep disturbance. The patient is not nervous/anxious.      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 03/09/2022   Stage 3a chronic kidney disease (HCC) 11/29/2021   Acute pain of right shoulder 09/24/2020   Statin myopathy 03/31/2020   Gastroesophageal reflux disease 09/19/2019   Special screening for malignant neoplasms, colon    Tubular adenoma of colon    Anxiety disorder 06/13/2019   Osteopenia determined by x-ray 06/06/2018   Essential hypertension 04/26/2018   Eczema 10/25/2017   Slow transit constipation 02/06/2017   Primary osteoarthritis of left hip 01/12/2017   OSA on CPAP 01/02/2017   Type II diabetes mellitus with complication (HCC) 06/24/2016   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   Hyperlipidemia associated with type 2 diabetes mellitus (HCC) 06/24/2016   Obesity (BMI 30.0-34.9) 06/24/2016   Vitamin D deficiency 06/24/2016   History of shingles 06/24/2016   Allergic rhinitis 06/24/2016    Allergies  Allergen Reactions   Atorvastatin Other (See Comments)    Peripheral neuropathy and myalgia   Influenza Vaccines Rash    Past Surgical History:  Procedure Laterality Date   ABDOMINAL HYSTERECTOMY     BILATERAL CARPAL TUNNEL RELEASE     CATARACT EXTRACTION W/PHACO Right 11/12/2018   Procedure: CATARACT EXTRACTION PHACO AND INTRAOCULAR LENS PLACEMENT (IOC)  RIGHT DIABETIC;  Surgeon: Nevada Crane, MD;  Location: [facility];  Service: Ophthalmology;  Laterality: Right;  Diabetic - diet controlled sleep apnea   CERVICAL FUSION  2004   COLONOSCOPY WITH PROPOFOL N/A 07/22/2019   Procedure: COLONOSCOPY WITH BIOPSIES;  Surgeon: Midge Minium, MD;  Location: [facility];  Service: Endoscopy;  Laterality: N/A;  Diabetic - diet controlled priority 4   FOOT SURGERY     HAND SURGERY Left 06/2022   palmar cyst removed   LUMBAR DISC SURGERY  1998   POLYPECTOMY N/A 07/22/2019   Procedure: POLYPECTOMY;  Surgeon: Midge Minium, MD;   Location: [facility];  Service: Endoscopy;  Laterality: N/A;    Social History   Tobacco Use   Smoking status: Never   Smokeless tobacco: Never  Vaping Use   Vaping Use: Never used  Substance Use Topics   Alcohol use: No   Drug use: No     Medication list has been reviewed and updated.  Current Meds  Medication Sig   acetaminophen (TYLENOL) 500 MG tablet Take 2 tablets (1,000 mg total) by mouth 2 (two) times daily.   amLODipine (NORVASC) 5 MG tablet Take 1 tablet by mouth once daily   Bisacodyl (LAXATIVE PO) Take by mouth. Generic OTC PRN   buPROPion (WELLBUTRIN XL) 300 MG 24 hr tablet Take 1 tablet (300 mg total) by mouth daily.   CALCIUM CITRATE PO Take 1,200 mg by mouth daily.   cholecalciferol (VITAMIN D3) 25 MCG (1000 UNIT) tablet Take 1,000 Units by mouth daily.   ezetimibe (ZETIA) 10 MG tablet Take 1 tablet by mouth once daily   famotidine (PEPCID) 40 MG tablet Take 1 tablet (40 mg total) by mouth at bedtime.   glucose blood (ONETOUCH ULTRA) test strip Test Blood Sugar twice daily.   irbesartan (AVAPRO) 300 MG tablet Take 1 tablet (300  mg total) by mouth daily.   meloxicam (MOBIC) 15 MG tablet Take 1 tablet (15 mg total) by mouth daily.   Multiple Vitamins-Minerals (MULTIVITAMIN ADULTS 50+ PO) Take 1 tablet by mouth daily.   mupirocin ointment (BACTROBAN) 2 % Apply 1 Application topically 2 (two) times daily.   NON FORMULARY CPAP @@ 12 cm H20   nystatin cream (MYCOSTATIN) Apply to affected area 2 times daily   Omega-3 Fatty Acids (FISH OIL) 1000 MG CAPS Take 1 capsule by mouth daily.   OneTouch Delica Lancets 33G MISC USE 1  LANCET TO CHECK GLUCOSE ONCE DAILY   REFRESH 1.4-0.6 % SOLN Place 1 drop into both eyes as needed.       11/08/2022    8:11 AM 07/08/2022   10:47 AM 03/09/2022    3:01 PM 12/07/2021    2:33 PM  GAD 7 : Generalized Anxiety Score  Nervous, Anxious, on Edge 0 0 0 0  Control/stop worrying 0 0 0 0  Worry too much - different things 0 0 0 0   Trouble relaxing 0 0 0 0  Restless 0 0 0 0  Easily annoyed or irritable 0 0 0 0  Afraid - awful might happen 0 0 0 0  Total GAD 7 Score 0 0 0 0  Anxiety Difficulty Not difficult at all Not difficult at all Not difficult at all Not difficult at all       11/08/2022    8:10 AM 07/08/2022   10:46 AM 06/30/2022   10:34 AM  Depression screen PHQ 2/9  Decreased Interest 0 0 0  Down, Depressed, Hopeless 0 0 0  PHQ - 2 Score 0 0 0  Altered sleeping 0 1 0  Tired, decreased energy 0 1 0  Change in appetite 0 0 0  Feeling bad or failure about yourself  0 0 0  Trouble concentrating 0 0 0  Moving slowly or fidgety/restless 0 0 0  Suicidal thoughts 0 0 0  PHQ-9 Score 0 2 0  Difficult doing work/chores Not difficult at all Not difficult at all Not difficult at all    BP Readings from Last 3 Encounters:  11/08/22 122/68  07/08/22 126/60  03/09/22 126/60    Physical Exam Vitals and nursing note reviewed.  Constitutional:      General: She is not in acute distress.    Appearance: She is well-developed.  HENT:     Head: Normocephalic and atraumatic.     Right Ear: Tympanic membrane and ear canal normal.     Left Ear: Tympanic membrane and ear canal normal.     Nose:     Right Sinus: No maxillary sinus tenderness.     Left Sinus: No maxillary sinus tenderness.  Eyes:     General: No scleral icterus.       Right eye: No discharge.        Left eye: No discharge.     Conjunctiva/sclera: Conjunctivae normal.  Neck:     Thyroid: No thyromegaly.     Vascular: No carotid bruit.  Cardiovascular:     Rate and Rhythm: Normal rate and regular rhythm.     Pulses: Normal pulses.     Heart sounds: Normal heart sounds.  Pulmonary:     Effort: Pulmonary effort is normal. No respiratory distress.     Breath sounds: No wheezing or rhonchi.  Chest:  Breasts:    Right: No mass, nipple discharge, skin change or tenderness.     Left:  No mass, nipple discharge, skin change or tenderness.   Abdominal:     General: Bowel sounds are normal.     Palpations: Abdomen is soft. There is no mass.     Tenderness: There is no abdominal tenderness.  Musculoskeletal:        General: Tenderness (right hand) present. No swelling.     Cervical back: Normal range of motion. No erythema.     Right lower leg: No edema.     Left lower leg: No edema.  Lymphadenopathy:     Cervical: No cervical adenopathy.  Skin:    General: Skin is warm and dry.     Findings: Abrasion present. No rash.     Comments: Right knee - with small eschar and no s/s of infection   Neurological:     General: No focal deficit present.     Mental Status: She is alert and oriented to person, place, and time.     Cranial Nerves: No cranial nerve deficit.     Sensory: No sensory deficit.     Deep Tendon Reflexes: Reflexes are normal and symmetric.  Psychiatric:        Attention and Perception: Attention normal.        Mood and Affect: Mood normal.        Behavior: Behavior normal.    Diabetic Foot Exam - Simple   Simple Foot Form Diabetic Foot exam was performed with the following findings: Yes 11/08/2022  8:17 AM  Visual Inspection No deformities, no ulcerations, no other skin breakdown bilaterally: Yes Sensation Testing Intact to touch and monofilament testing bilaterally: Yes Pulse Check Posterior Tibialis and Dorsalis pulse intact bilaterally: Yes Comments      Wt Readings from Last 3 Encounters:  11/08/22 201 lb (91.2 kg)  07/08/22 201 lb 3.2 oz (91.3 kg)  06/30/22 205 lb (93 kg)    BP 122/68   Pulse 78   Ht 5\' 7"  (1.702 m)   Wt 201 lb (91.2 kg)   SpO2 96%   BMI 31.48 kg/m   Assessment and Plan:  Problem List Items Addressed This Visit     Type II diabetes mellitus with complication (HCC) (Chronic)    Blood sugars controlled with diet alone. 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 On Zetia daily. Lab Results  Component Value Date   LDLCALC 112 (H) 11/02/2021        Relevant Orders   Comprehensive metabolic panel   Lipid panel   Essential hypertension (Chronic)    Stable exam with well controlled BP.  Currently taking irbesartan and amlodipine. Tolerating medications without concerns or side effects. Will continue to recommend low sodium diet and current regimen.       Relevant Orders   CBC with Differential/Platelet   Comprehensive metabolic panel   TSH   Other Visit Diagnoses     Annual physical exam    -  Primary   Up to date on screenings and immunizations   Abrasion, right knee, initial encounter       Tdap up to date continue local care if needed  Contusion of right hand, initial encounter       mild tenderness but so evidence of fracture recommend tylenol as needed - follow up if no continued improvement       Return in about 4 months (around 03/11/2023) for DM, HTN.   Partially dictated using Dragon software, any errors are not intentional.  Reubin Milan, MD [facility] [place], [place]

## 2022-11-08 NOTE — Assessment & Plan Note (Addendum)
Patient declines statin therapy due to neuropathy. On Zetia daily. Lab Results  Component Value Date   LDLCALC 112 (H) 11/02/2021

## 2022-11-08 NOTE — Assessment & Plan Note (Signed)
Stable exam with well controlled BP.  Currently taking irbesartan and amlodipine. Tolerating medications without concerns or side effects. Will continue to recommend low sodium diet and current regimen.

## 2022-11-08 NOTE — Assessment & Plan Note (Signed)
Blood sugars controlled with diet alone. Lab Results  Component Value Date   HGBA1C 7.0 (A) 07/08/2022

## 2022-11-08 NOTE — Assessment & Plan Note (Signed)
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

## 2022-11-09 LAB — COMPREHENSIVE METABOLIC PANEL
ALT: 13 IU/L (ref 0–32)
AST: 21 IU/L (ref 0–40)
Albumin: 4.2 g/dL (ref 3.7–4.7)
Alkaline Phosphatase: 123 IU/L — ABNORMAL HIGH (ref 44–121)
BUN/Creatinine Ratio: 10 — ABNORMAL LOW (ref 12–28)
BUN: 11 mg/dL (ref 8–27)
Bilirubin Total: 0.4 mg/dL (ref 0.0–1.2)
CO2: 24 mmol/L (ref 20–29)
Calcium: 9.9 mg/dL (ref 8.7–10.3)
Chloride: 101 mmol/L (ref 96–106)
Creatinine, Ser: 1.08 mg/dL — ABNORMAL HIGH (ref 0.57–1.00)
Globulin, Total: 3.3 g/dL (ref 1.5–4.5)
Glucose: 135 mg/dL — ABNORMAL HIGH (ref 70–99)
Potassium: 4.6 mmol/L (ref 3.5–5.2)
Sodium: 138 mmol/L (ref 134–144)
Total Protein: 7.5 g/dL (ref 6.0–8.5)
eGFR: 52 mL/min/{1.73_m2} — ABNORMAL LOW (ref >59)

## 2022-11-09 LAB — CBC WITH DIFFERENTIAL/PLATELET
Basophils Absolute: 0.1 10*3/uL (ref 0.0–0.2)
Basos: 2 %
EOS (ABSOLUTE): 0.1 10*3/uL (ref 0.0–0.4)
Eos: 2 %
Hematocrit: 39.1 % (ref 34.0–46.6)
Hemoglobin: 13 g/dL (ref 11.1–15.9)
Immature Grans (Abs): 0 10*3/uL (ref 0.0–0.1)
Immature Granulocytes: 0 %
Lymphocytes Absolute: 2.1 10*3/uL (ref 0.7–3.1)
Lymphs: 48 %
MCH: 29.6 pg (ref 26.6–33.0)
MCHC: 33.2 g/dL (ref 31.5–35.7)
MCV: 89 fL (ref 79–97)
Monocytes Absolute: 0.4 10*3/uL (ref 0.1–0.9)
Monocytes: 10 %
Neutrophils Absolute: 1.6 10*3/uL (ref 1.4–7.0)
Neutrophils: 38 %
Platelets: 216 10*3/uL (ref 150–450)
RBC: 4.39 x10E6/uL (ref 3.77–5.28)
RDW: 12.5 % (ref 11.7–15.4)
WBC: 4.4 10*3/uL (ref 3.4–10.8)

## 2022-11-09 LAB — LIPID PANEL
Chol/HDL Ratio: 2.8 ratio (ref 0.0–4.4)
Cholesterol, Total: 189 mg/dL (ref 100–199)
HDL: 68 mg/dL (ref >39)
LDL Chol Calc (NIH): 95 mg/dL (ref 0–99)
Triglycerides: 155 mg/dL — ABNORMAL HIGH (ref 0–149)
VLDL Cholesterol Cal: 26 mg/dL (ref 5–40)

## 2022-11-09 LAB — TSH: TSH: 1.42 u[IU]/mL (ref 0.450–4.500)

## 2022-11-09 LAB — MICROALBUMIN / CREATININE URINE RATIO
Creatinine, Urine: 31.9 mg/dL
Microalb/Creat Ratio: <9 mg/g creat (ref 0–29)
Microalbumin, Urine: <3 ug/mL

## 2022-11-09 LAB — HEMOGLOBIN A1C
Est. average glucose Bld gHb Est-mCnc: 151 mg/dL
Hgb A1c MFr Bld: 6.9 % — ABNORMAL HIGH (ref 4.8–5.6)

## 2022-12-03 ENCOUNTER — Other Ambulatory Visit: Payer: Self-pay | Admitting: Internal Medicine

## 2022-12-03 DIAGNOSIS — I10 Essential (primary) hypertension: Secondary | ICD-10-CM

## 2022-12-06 DIAGNOSIS — E119 Type 2 diabetes mellitus without complications: Secondary | ICD-10-CM | POA: Diagnosis not present

## 2022-12-06 DIAGNOSIS — H35033 Hypertensive retinopathy, bilateral: Secondary | ICD-10-CM | POA: Diagnosis not present

## 2022-12-06 DIAGNOSIS — H2512 Age-related nuclear cataract, left eye: Secondary | ICD-10-CM | POA: Diagnosis not present

## 2022-12-06 LAB — HM DIABETES EYE EXAM

## 2022-12-19 DIAGNOSIS — G4733 Obstructive sleep apnea (adult) (pediatric): Secondary | ICD-10-CM | POA: Diagnosis not present

## 2023-01-02 ENCOUNTER — Other Ambulatory Visit: Payer: Self-pay | Admitting: Internal Medicine

## 2023-01-02 DIAGNOSIS — F419 Anxiety disorder, unspecified: Secondary | ICD-10-CM

## 2023-01-02 DIAGNOSIS — E785 Hyperlipidemia, unspecified: Secondary | ICD-10-CM

## 2023-01-03 NOTE — Telephone Encounter (Signed)
Requested Prescriptions  Pending Prescriptions Disp Refills   buPROPion (WELLBUTRIN XL) 300 MG 24 hr tablet [Pharmacy Med Name: buPROPion HCl ER (XL) 300 MG Oral Tablet Extended Release 24 Hour] 90 tablet 1    Sig: Take 1 tablet by mouth once daily     Psychiatry: Antidepressants - bupropion Failed - 01/02/2023 12:21 PM      Failed - Cr in normal range and within 360 days    Creatinine, Ser  Date Value Ref Range Status  11/08/2022 1.08 (H) 0.57 - 1.00 mg/dL Final         Passed - AST in normal range and within 360 days    AST  Date Value Ref Range Status  11/08/2022 21 0 - 40 IU/L Final         Passed - ALT in normal range and within 360 days    ALT  Date Value Ref Range Status  11/08/2022 13 0 - 32 IU/L Final         Passed - Last BP in normal range    BP Readings from Last 1 Encounters:  11/08/22 122/68         Passed - Valid encounter within last 6 months    Recent Outpatient Visits           1 month ago Annual physical exam   [place] [facility] at [facility], Nyoka Cowden, MD   5 months ago Type II diabetes mellitus with complication [facility])   [place] [facility] at [facility], Nyoka Cowden, MD   10 months ago Essential hypertension   [place] [facility] at [facility], Nyoka Cowden, MD   1 year ago Candidiasis of skin   [place] [facility] at [facility], [facility]   1 year ago Stage 3a chronic kidney disease [facility])   [place] [facility] at [facility], Nyoka Cowden, MD       Future Appointments             In 2 months Judithann Graves Nyoka Cowden, MD [facility] at [facility]             ezetimibe (ZETIA) 10 MG tablet [Pharmacy Med Name: Ezetimibe 10 MG Oral Tablet] 90 tablet 3    Sig: Take 1 tablet by mouth once daily     Cardiovascular:  Antilipid - Sterol  Transport Inhibitors Failed - 01/02/2023 12:21 PM      Failed - Lipid Panel in normal range within the last 12 months    Cholesterol, Total  Date Value Ref Range Status  11/08/2022 189 100 - 199 mg/dL Final   LDL Chol Calc (NIH)  Date Value Ref Range Status  11/08/2022 95 0 - 99 mg/dL Final   HDL  Date Value Ref Range Status  11/08/2022 68 >39 mg/dL Final   Triglycerides  Date Value Ref Range Status  11/08/2022 155 (H) 0 - 149 mg/dL Final         Passed - AST in normal range and within 360 days    AST  Date Value Ref Range Status  11/08/2022 21 0 - 40 IU/L Final         Passed - ALT in normal range and within 360 days    ALT  Date Value Ref Range Status  11/08/2022 13 0 -  32 IU/L Final         Passed - Patient is not pregnant      Passed - Valid encounter within last 12 months    Recent Outpatient Visits           1 month ago Annual physical exam   [place] [facility] at [facility], Nyoka Cowden, MD   5 months ago Type II diabetes mellitus with complication [facility])   [place] [facility] at [facility], Nyoka Cowden, MD   10 months ago Essential hypertension   [place] [facility] at [facility], Nyoka Cowden, MD   1 year ago Candidiasis of skin   [place] [facility] at [facility], MD   1 year ago Stage 3a chronic kidney disease [facility])   [place] [facility] at [facility], Nyoka Cowden, MD       Future Appointments             In 2 months Judithann Graves, Nyoka Cowden, MD [facility] at [facility], [facility]

## 2023-01-10 ENCOUNTER — Telehealth: Payer: Self-pay | Admitting: Internal Medicine

## 2023-01-10 ENCOUNTER — Other Ambulatory Visit: Payer: Self-pay

## 2023-01-10 DIAGNOSIS — G4733 Obstructive sleep apnea (adult) (pediatric): Secondary | ICD-10-CM

## 2023-01-10 NOTE — Telephone Encounter (Signed)
Referral placed for  ENT.

## 2023-01-10 NOTE — Telephone Encounter (Signed)
Copied from CRM [id number]. Topic: Referral - Request for Referral >> Jan 10, 2023 10:22 AM Everette C wrote: Has patient seen PCP for this complaint? Yes.   *If NO, is insurance requiring patient see PCP for this issue before PCP can refer them? Referral for which specialty: Otolaryngology  Preferred provider/office: Patient has no preference  Reason for referral: Patient would like to order a new CPAP machine

## 2023-01-17 DIAGNOSIS — J3489 Other specified disorders of nose and nasal sinuses: Secondary | ICD-10-CM | POA: Diagnosis not present

## 2023-01-17 DIAGNOSIS — R0989 Other specified symptoms and signs involving the circulatory and respiratory systems: Secondary | ICD-10-CM | POA: Diagnosis not present

## 2023-01-17 DIAGNOSIS — H903 Sensorineural hearing loss, bilateral: Secondary | ICD-10-CM | POA: Diagnosis not present

## 2023-01-17 DIAGNOSIS — H6121 Impacted cerumen, right ear: Secondary | ICD-10-CM | POA: Diagnosis not present

## 2023-01-17 DIAGNOSIS — H905 Unspecified sensorineural hearing loss: Secondary | ICD-10-CM | POA: Diagnosis not present

## 2023-01-19 DIAGNOSIS — G4733 Obstructive sleep apnea (adult) (pediatric): Secondary | ICD-10-CM | POA: Diagnosis not present

## 2023-02-02 ENCOUNTER — Ambulatory Visit: Payer: Self-pay

## 2023-02-02 NOTE — Telephone Encounter (Signed)
Chief Complaint: Hand Pain  Symptoms: right hand pain 6/10 Frequency: constant Pertinent Negatives: Patient denies weakness and numbness in the hand Disposition: [] ED /[] Urgent Care (no appt availability in office) / [x] Appointment(In office/virtual)/ []  [facility]/ [] Home Care/ [] Refused Recommended Disposition /[] [facility]/ []  Follow-up with PCP Additional Notes: Patient states she had a fall back in July and injured her right hand. Patient states she did not have the hand evaluated after the fall but it is still causing pain. Patient reports it hurts more at night and tylenol or ibuprofen does not help improve the symptoms. Patient states the hand is slightly swollen as well. Patient was given care advice and appointment has been scheduled with PCP tomorrow. Advised to callback if symptoms get worse.  Reason for Disposition  [1] MODERATE pain (e.g., interferes with normal activities) AND [2] present > 3 days  Answer Assessment - Initial Assessment Questions 1. ONSET: "When did the pain start?"     A few days ago 2. LOCATION: "Where is the pain located?"     Right hand 3. PAIN: "How bad is the pain?" (Scale 1-10; or mild, moderate, severe)   - MILD (1-3): doesn't interfere with normal activities   - MODERATE (4-7): interferes with normal activities (e.g., work or school) or awakens from sleep   - SEVERE (8-10): excruciating pain, unable to use hand at all     6/10 hurts more at night 4. WORK OR EXERCISE: "Has there been any recent work or exercise that involved this part (i.e., hand or wrist) of the body?"     No  5. CAUSE: "What do you think is causing the pain?"     I had a fall back in July  6. AGGRAVATING FACTORS: "What makes the pain worse?" (e.g., using computer)     Just laying down to go to sleep 7. OTHER SYMPTOMS: "Do you have any other symptoms?" (e.g., neck pain, swelling, rash, numbness, fever)     This hand is slightly swollen  Protocols used:  Hand and Wrist Pain-A-AH

## 2023-02-03 ENCOUNTER — Ambulatory Visit (INDEPENDENT_AMBULATORY_CARE_PROVIDER_SITE_OTHER): Payer: Medicare HMO | Admitting: Internal Medicine

## 2023-02-03 ENCOUNTER — Encounter: Payer: Self-pay | Admitting: Internal Medicine

## 2023-02-03 ENCOUNTER — Ambulatory Visit
Admission: RE | Admit: 2023-02-03 | Discharge: 2023-02-03 | Disposition: A | Discharge status: Home / Self Care | Payer: Medicare HMO | Attending: Internal Medicine | Admitting: Internal Medicine

## 2023-02-03 ENCOUNTER — Ambulatory Visit
Admission: RE | Admit: 2023-02-03 | Discharge: 2023-02-03 | Disposition: A | Discharge status: Home / Self Care | Payer: Medicare HMO | Source: Ambulatory Visit | Attending: Internal Medicine | Admitting: Internal Medicine

## 2023-02-03 VITALS — BP 128/78 | HR 73 | Ht 67.0 in | Wt 207.0 lb

## 2023-02-03 DIAGNOSIS — M1612 Unilateral primary osteoarthritis, left hip: Secondary | ICD-10-CM | POA: Diagnosis not present

## 2023-02-03 DIAGNOSIS — M19041 Primary osteoarthritis, right hand: Secondary | ICD-10-CM | POA: Diagnosis not present

## 2023-02-03 DIAGNOSIS — M79641 Pain in right hand: Secondary | ICD-10-CM

## 2023-02-03 MED ORDER — MELOXICAM 15 MG PO TABS
15.0000 mg | ORAL_TABLET | Freq: Every day | ORAL | 0 refills | Status: DC
Start: 2023-02-03 — End: 2023-03-07

## 2023-02-03 NOTE — Progress Notes (Signed)
Date:  02/03/2023   Name:  Molly Ruiz   DOB:  1940/10/21   MRN:  [id number]   Chief Complaint: Hand Pain (Right. Patient said she fell in July and used her hand to catch her body before the fall. She had swelling after the fall. Still hurts in hand today. )  Hand Pain  Incident onset: about 2 months ago. The incident occurred in the street. The injury mechanism was a fall. The pain is present in the right hand. Pertinent negatives include no chest pain.    Lab Results  Component Value Date   NA 138 11/08/2022   K 4.6 11/08/2022   CO2 24 11/08/2022   GLUCOSE 135 (H) 11/08/2022   BUN 11 11/08/2022   CREATININE 1.08 (H) 11/08/2022   CALCIUM 9.9 11/08/2022   EGFR 52 (L) 11/08/2022   GFRNONAA 55 (L) 11/25/2019   Lab Results  Component Value Date   CHOL 189 11/08/2022   HDL 68 11/08/2022   LDLCALC 95 11/08/2022   TRIG 155 (H) 11/08/2022   CHOLHDL 2.8 11/08/2022   Lab Results  Component Value Date   TSH 1.420 11/08/2022   Lab Results  Component Value Date   HGBA1C 6.9 (H) 11/08/2022   Lab Results  Component Value Date   WBC 4.4 11/08/2022   HGB 13.0 11/08/2022   HCT 39.1 11/08/2022   MCV 89 11/08/2022   PLT 216 11/08/2022   Lab Results  Component Value Date   ALT 13 11/08/2022   AST 21 11/08/2022   ALKPHOS 123 (H) 11/08/2022   BILITOT 0.4 11/08/2022   Lab Results  Component Value Date   VD25OH 37.3 11/02/2021     Review of Systems  Constitutional:  Negative for chills and fatigue.  Respiratory:  Negative for chest tightness and shortness of breath.   Cardiovascular:  Negative for chest pain and palpitations.  Musculoskeletal:  Positive for arthralgias and myalgias.    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 03/09/2022   Stage 3a chronic kidney disease (HCC) 11/29/2021   Acute pain of right shoulder 09/24/2020   Statin myopathy 03/31/2020   Gastroesophageal reflux disease 09/19/2019    Special screening for malignant neoplasms, colon    Tubular adenoma of colon    Anxiety disorder 06/13/2019   Osteopenia determined by x-ray 06/06/2018   Essential hypertension 04/26/2018   Eczema 10/25/2017   Slow transit constipation 02/06/2017   Primary osteoarthritis of left hip 01/12/2017   OSA on CPAP 01/02/2017   Type II diabetes mellitus with complication (HCC) 06/24/2016   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   Hyperlipidemia associated with type 2 diabetes mellitus (HCC) 06/24/2016   Obesity (BMI 30.0-34.9) 06/24/2016   Vitamin D deficiency 06/24/2016   History of shingles 06/24/2016   Allergic rhinitis 06/24/2016    Allergies  Allergen Reactions   Atorvastatin Other (See Comments)    Peripheral neuropathy and myalgia   Influenza Vaccines Rash    Past Surgical History:  Procedure Laterality Date   ABDOMINAL HYSTERECTOMY     BILATERAL CARPAL TUNNEL RELEASE     CATARACT EXTRACTION W/PHACO Right 11/12/2018   Procedure: CATARACT EXTRACTION PHACO AND INTRAOCULAR LENS PLACEMENT (IOC)  RIGHT DIABETIC;  Surgeon: Nevada Crane, MD;  Location: [facility];  Service: Ophthalmology;  Laterality: Right;  Diabetic - diet controlled sleep apnea   CERVICAL FUSION  2004   COLONOSCOPY WITH PROPOFOL N/A  07/22/2019   Procedure: COLONOSCOPY WITH BIOPSIES;  Surgeon: Midge Minium, MD;  Location: [facility];  Service: Endoscopy;  Laterality: N/A;  Diabetic - diet controlled priority 4   FOOT SURGERY     HAND SURGERY Left 06/2022   palmar cyst removed   LUMBAR DISC SURGERY  1998   POLYPECTOMY N/A 07/22/2019   Procedure: POLYPECTOMY;  Surgeon: Midge Minium, MD;  Location: [facility];  Service: Endoscopy;  Laterality: N/A;    Social History   Tobacco Use   Smoking status: Never   Smokeless tobacco: Never  Vaping Use   Vaping status: Never Used  Substance Use Topics   Alcohol use: No   Drug use: No     Medication list has  been reviewed and updated.  Current Meds  Medication Sig   acetaminophen (TYLENOL) 500 MG tablet Take 2 tablets (1,000 mg total) by mouth 2 (two) times daily.   amLODipine (NORVASC) 5 MG tablet Take 1 tablet by mouth once daily   Bisacodyl (LAXATIVE PO) Take by mouth. Generic OTC PRN   buPROPion (WELLBUTRIN XL) 300 MG 24 hr tablet Take 1 tablet by mouth once daily   CALCIUM CITRATE PO Take 1,200 mg by mouth daily.   cholecalciferol (VITAMIN D3) 25 MCG (1000 UNIT) tablet Take 1,000 Units by mouth daily.   ezetimibe (ZETIA) 10 MG tablet Take 1 tablet by mouth once daily   famotidine (PEPCID) 40 MG tablet Take 1 tablet (40 mg total) by mouth at bedtime.   glucose blood (ONETOUCH ULTRA) test strip Test Blood Sugar twice daily.   irbesartan (AVAPRO) 300 MG tablet Take 1 tablet (300 mg total) by mouth daily.   Multiple Vitamins-Minerals (MULTIVITAMIN ADULTS 50+ PO) Take 1 tablet by mouth daily.   mupirocin ointment (BACTROBAN) 2 % Apply 1 Application topically 2 (two) times daily.   NON FORMULARY CPAP @@ 12 cm H20   nystatin cream (MYCOSTATIN) Apply to affected area 2 times daily   Omega-3 Fatty Acids (FISH OIL) 1000 MG CAPS Take 1 capsule by mouth daily.   OneTouch Delica Lancets 33G MISC USE 1  LANCET TO CHECK GLUCOSE ONCE DAILY   REFRESH 1.4-0.6 % SOLN Place 1 drop into both eyes as needed.   [DISCONTINUED] meloxicam (MOBIC) 15 MG tablet Take 1 tablet (15 mg total) by mouth daily.       02/03/2023    3:19 PM 11/08/2022    8:11 AM 07/08/2022   10:47 AM 03/09/2022    3:01 PM  GAD 7 : Generalized Anxiety Score  Nervous, Anxious, on Edge 0 0 0 0  Control/stop worrying 0 0 0 0  Worry too much - different things 0 0 0 0  Trouble relaxing 0 0 0 0  Restless 0 0 0 0  Easily annoyed or irritable 0 0 0 0  Afraid - awful might happen 0 0 0 0  Total GAD 7 Score 0 0 0 0  Anxiety Difficulty Not difficult at all Not difficult at all Not difficult at all Not difficult at all       02/03/2023    3:19  PM 11/08/2022    8:10 AM 07/08/2022   10:46 AM  Depression screen PHQ 2/9  Decreased Interest 0 0 0  Down, Depressed, Hopeless 0 0 0  PHQ - 2 Score 0 0 0  Altered sleeping 0 0 1  Tired, decreased energy 0 0 1  Change in appetite 0 0 0  Feeling bad or failure about yourself  0 0 0  Trouble concentrating 0 0 0  Moving slowly or fidgety/restless 0 0 0  Suicidal thoughts 0 0 0  PHQ-9 Score 0 0 2  Difficult doing work/chores Not difficult at all Not difficult at all Not difficult at all    BP Readings from Last 3 Encounters:  02/03/23 128/78  11/08/22 122/68  07/08/22 126/60    Physical Exam Vitals and nursing note reviewed.  Constitutional:      General: She is not in acute distress.    Appearance: She is well-developed.  HENT:     Head: Normocephalic and atraumatic.  Cardiovascular:     Rate and Rhythm: Normal rate and regular rhythm.  Pulmonary:     Effort: Pulmonary effort is normal. No respiratory distress.     Breath sounds: No wheezing or rhonchi.  Musculoskeletal:     Right hand: Bony tenderness present. No swelling or deformity. Decreased range of motion.  Skin:    General: Skin is warm and dry.     Findings: No rash.  Neurological:     Mental Status: She is alert and oriented to person, place, and time.  Psychiatric:        Mood and Affect: Mood normal.        Behavior: Behavior normal.     Wt Readings from Last 3 Encounters:  02/03/23 207 lb (93.9 kg)  11/08/22 201 lb (91.2 kg)  07/08/22 201 lb 3.2 oz (91.3 kg)    BP 128/78   Pulse 73   Ht 5\' 7"  (1.702 m)   Wt 207 lb (93.9 kg)   SpO2 97%   BMI 32.42 kg/m   Assessment and Plan:  Problem List Items Addressed This Visit       Unprioritized   Primary osteoarthritis of left hip   Relevant Medications   meloxicam (MOBIC) 15 MG tablet   Other Visit Diagnoses     Right hand pain    -  Primary   will get imaging to rule out fracture continue topical agents; tylenol   Relevant Orders   DG Hand  Complete Right       No follow-ups on file.    Reubin Milan, MD [facility] [place]

## 2023-02-03 NOTE — Patient Instructions (Signed)
Take Tylenol 1000 mg twice a day  Use Icy Hot or BenGay; also heat or ice

## 2023-02-08 ENCOUNTER — Telehealth: Payer: Self-pay | Admitting: Internal Medicine

## 2023-02-08 NOTE — Telephone Encounter (Signed)
Called and left VM informing Xray results are not back yet. Told her we will call you when it returns.  - Wilkin Lippy

## 2023-02-08 NOTE — Telephone Encounter (Signed)
Copied from CRM [id number]. Topic: General - Inquiry >> Feb 08, 2023  3:37 PM De Blanch wrote: Reason for CRM:Pt is calling to f/u on recent X-ray results.  Asked for a callback when they are ready.  Please advise.

## 2023-02-18 DIAGNOSIS — G4733 Obstructive sleep apnea (adult) (pediatric): Secondary | ICD-10-CM | POA: Diagnosis not present

## 2023-02-23 NOTE — Telephone Encounter (Signed)
Called pt let her know that we still do not have the results for her xray. Pt verbalized understanding.  KP

## 2023-02-23 NOTE — Telephone Encounter (Signed)
Patient has called back to check on her results from the XRAY she had done back in September. Please contact patient back @ # [phone].

## 2023-03-01 ENCOUNTER — Other Ambulatory Visit: Payer: Self-pay | Admitting: Internal Medicine

## 2023-03-01 DIAGNOSIS — I10 Essential (primary) hypertension: Secondary | ICD-10-CM

## 2023-03-07 ENCOUNTER — Other Ambulatory Visit: Payer: Self-pay | Admitting: Internal Medicine

## 2023-03-07 DIAGNOSIS — M1612 Unilateral primary osteoarthritis, left hip: Secondary | ICD-10-CM

## 2023-03-13 ENCOUNTER — Encounter: Payer: Self-pay | Admitting: Internal Medicine

## 2023-03-13 ENCOUNTER — Ambulatory Visit (INDEPENDENT_AMBULATORY_CARE_PROVIDER_SITE_OTHER): Payer: Medicare HMO | Admitting: Internal Medicine

## 2023-03-13 VITALS — BP 124/70 | HR 76 | Ht 67.0 in | Wt 203.6 lb

## 2023-03-13 DIAGNOSIS — E118 Type 2 diabetes mellitus with unspecified complications: Secondary | ICD-10-CM

## 2023-03-13 DIAGNOSIS — K219 Gastro-esophageal reflux disease without esophagitis: Secondary | ICD-10-CM

## 2023-03-13 DIAGNOSIS — Z23 Encounter for immunization: Secondary | ICD-10-CM

## 2023-03-13 DIAGNOSIS — I10 Essential (primary) hypertension: Secondary | ICD-10-CM | POA: Diagnosis not present

## 2023-03-13 DIAGNOSIS — N1831 Chronic kidney disease, stage 3a: Secondary | ICD-10-CM | POA: Diagnosis not present

## 2023-03-13 MED ORDER — FAMOTIDINE 40 MG PO TABS
40.0000 mg | ORAL_TABLET | Freq: Every day | ORAL | 3 refills | Status: DC
Start: 2023-03-13 — End: 2024-03-05

## 2023-03-13 MED ORDER — ONETOUCH DELICA LANCETS 33G MISC: 1.0000 | Freq: Every day | 0 refills | Status: AC

## 2023-03-13 MED ORDER — ONETOUCH ULTRA VI STRP: ORAL_STRIP | 12 refills | Status: AC

## 2023-03-13 NOTE — Assessment & Plan Note (Signed)
Diet controlled diabetes. Lab Results  Component Value Date   HGBA1C 6.9 (H) 11/08/2022

## 2023-03-13 NOTE — Assessment & Plan Note (Signed)
Reflux symptoms are minimal on current therapy - diet changes.  She uses Pepcid rarely. No red flag signs such as weight loss, n/v, melena

## 2023-03-13 NOTE — Progress Notes (Signed)
Date:  03/13/2023   Name:  Molly Ruiz   DOB:  March 02, 1941   MRN:  [id number]   Chief Complaint: Diabetes and Hypertension  Hypertension This is a chronic problem. The problem is controlled. Pertinent negatives include no chest pain, headaches, palpitations or shortness of breath. Past treatments include angiotensin blockers and calcium channel blockers. The current treatment provides significant improvement. Hypertensive end-organ damage includes kidney disease. There is no history of CAD/MI or CVA.  Diabetes She presents for her follow-up diabetic visit. She has type 2 diabetes mellitus. Her disease course has been stable. Pertinent negatives for hypoglycemia include no dizziness, headaches or nervousness/anxiousness. Pertinent negatives for diabetes include no chest pain, no fatigue and no weakness. Pertinent negatives for diabetic complications include no CVA. Current diabetic treatment includes diet. She is compliant with treatment most of the time. An ACE inhibitor/angiotensin II receptor blocker is being taken.  Gastroesophageal Reflux She complains of heartburn. She reports no abdominal pain, no chest pain, no coughing or no wheezing. This is a recurrent problem. The problem occurs rarely (usually with fried foods). Pertinent negatives include no fatigue.    Review of Systems  Constitutional:  Negative for fatigue and unexpected weight change.  HENT:  Negative for nosebleeds.   Eyes:  Negative for visual disturbance.  Respiratory:  Negative for cough, chest tightness, shortness of breath and wheezing.   Cardiovascular:  Negative for chest pain, palpitations and leg swelling.  Gastrointestinal:  Positive for heartburn. Negative for abdominal pain, constipation and diarrhea.  Neurological:  Negative for dizziness, weakness, light-headedness and headaches.  Psychiatric/Behavioral:  Negative for decreased concentration, dysphoric mood and sleep disturbance. The patient is not  nervous/anxious.      Lab Results  Component Value Date   NA 138 11/08/2022   K 4.6 11/08/2022   CO2 24 11/08/2022   GLUCOSE 135 (H) 11/08/2022   BUN 11 11/08/2022   CREATININE 1.08 (H) 11/08/2022   CALCIUM 9.9 11/08/2022   EGFR 52 (L) 11/08/2022   GFRNONAA 55 (L) 11/25/2019   Lab Results  Component Value Date   CHOL 189 11/08/2022   HDL 68 11/08/2022   LDLCALC 95 11/08/2022   TRIG 155 (H) 11/08/2022   CHOLHDL 2.8 11/08/2022   Lab Results  Component Value Date   TSH 1.420 11/08/2022   Lab Results  Component Value Date   HGBA1C 6.9 (H) 11/08/2022   Lab Results  Component Value Date   WBC 4.4 11/08/2022   HGB 13.0 11/08/2022   HCT 39.1 11/08/2022   MCV 89 11/08/2022   PLT 216 11/08/2022   Lab Results  Component Value Date   ALT 13 11/08/2022   AST 21 11/08/2022   ALKPHOS 123 (H) 11/08/2022   BILITOT 0.4 11/08/2022   Lab Results  Component Value Date   VD25OH 37.3 11/02/2021     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 03/09/2022   Stage 3a chronic kidney disease (HCC) 11/29/2021   Acute pain of right shoulder 09/24/2020   Statin myopathy 03/31/2020   Gastroesophageal reflux disease 09/19/2019   Special screening for malignant neoplasms, colon    Tubular adenoma of colon    Anxiety disorder 06/13/2019   Osteopenia determined by x-ray 06/06/2018   Essential hypertension 04/26/2018   Eczema 10/25/2017   Slow transit constipation 02/06/2017   Primary osteoarthritis of left hip 01/12/2017   OSA on CPAP 01/02/2017   Type II diabetes mellitus  with complication (HCC) 06/24/2016   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   Hyperlipidemia associated with type 2 diabetes mellitus (HCC) 06/24/2016   Obesity (BMI 30.0-34.9) 06/24/2016   Vitamin D deficiency 06/24/2016   History of shingles 06/24/2016   Allergic rhinitis 06/24/2016    Allergies  Allergen Reactions   Atorvastatin  Other (See Comments)    Peripheral neuropathy and myalgia   Influenza Vaccines Rash    Past Surgical History:  Procedure Laterality Date   ABDOMINAL HYSTERECTOMY     BILATERAL CARPAL TUNNEL RELEASE     CATARACT EXTRACTION W/PHACO Right 11/12/2018   Procedure: CATARACT EXTRACTION PHACO AND INTRAOCULAR LENS PLACEMENT (IOC)  RIGHT DIABETIC;  Surgeon: Nevada Crane, MD;  Location: [facility];  Service: Ophthalmology;  Laterality: Right;  Diabetic - diet controlled sleep apnea   CERVICAL FUSION  2004   COLONOSCOPY WITH PROPOFOL N/A 07/22/2019   Procedure: COLONOSCOPY WITH BIOPSIES;  Surgeon: Midge Minium, MD;  Location: [facility];  Service: Endoscopy;  Laterality: N/A;  Diabetic - diet controlled priority 4   FOOT SURGERY     HAND SURGERY Left 06/2022   palmar cyst removed   LUMBAR DISC SURGERY  1998   POLYPECTOMY N/A 07/22/2019   Procedure: POLYPECTOMY;  Surgeon: Midge Minium, MD;  Location: [facility];  Service: Endoscopy;  Laterality: N/A;    Social History   Tobacco Use   Smoking status: Never   Smokeless tobacco: Never  Vaping Use   Vaping status: Never Used  Substance Use Topics   Alcohol use: No   Drug use: No     Medication list has been reviewed and updated.  Current Meds  Medication Sig   acetaminophen (TYLENOL) 500 MG tablet Take 2 tablets (1,000 mg total) by mouth 2 (two) times daily.   amLODipine (NORVASC) 5 MG tablet Take 1 tablet by mouth once daily   Bisacodyl (LAXATIVE PO) Take by mouth. Generic OTC PRN   buPROPion (WELLBUTRIN XL) 300 MG 24 hr tablet Take 1 tablet by mouth once daily   CALCIUM CITRATE PO Take 1,200 mg by mouth daily.   cholecalciferol (VITAMIN D3) 25 MCG (1000 UNIT) tablet Take 1,000 Units by mouth daily.   ezetimibe (ZETIA) 10 MG tablet Take 1 tablet by mouth once daily   irbesartan (AVAPRO) 300 MG tablet Take 1 tablet by mouth once daily   meloxicam (MOBIC) 15 MG tablet Take 1 tablet by mouth once daily    Multiple Vitamins-Minerals (MULTIVITAMIN ADULTS 50+ PO) Take 1 tablet by mouth daily.   mupirocin ointment (BACTROBAN) 2 % Apply 1 Application topically 2 (two) times daily.   NON FORMULARY CPAP @@ 12 cm H20   nystatin cream (MYCOSTATIN) Apply to affected area 2 times daily   Omega-3 Fatty Acids (FISH OIL) 1000 MG CAPS Take 1 capsule by mouth daily.   REFRESH 1.4-0.6 % SOLN Place 1 drop into both eyes as needed.   [DISCONTINUED] famotidine (PEPCID) 40 MG tablet Take 1 tablet (40 mg total) by mouth at bedtime.   [DISCONTINUED] glucose blood (ONETOUCH ULTRA) test strip Test Blood Sugar twice daily.   [DISCONTINUED] OneTouch Delica Lancets 33G MISC USE 1  LANCET TO CHECK GLUCOSE ONCE DAILY       03/13/2023   10:55 AM 02/03/2023    3:19 PM 11/08/2022    8:11 AM 07/08/2022   10:47 AM  GAD 7 : Generalized Anxiety Score  Nervous, Anxious, on Edge 0 0 0 0  Control/stop worrying  0 0 0 0  Worry too much - different things 0 0 0 0  Trouble relaxing 0 0 0 0  Restless 0 0 0 0  Easily annoyed or irritable 0 0 0 0  Afraid - awful might happen 0 0 0 0  Total GAD 7 Score 0 0 0 0  Anxiety Difficulty Not difficult at all Not difficult at all Not difficult at all Not difficult at all       03/13/2023   10:55 AM 02/03/2023    3:19 PM 11/08/2022    8:10 AM  Depression screen PHQ 2/9  Decreased Interest 0 0 0  Down, Depressed, Hopeless 0 0 0  PHQ - 2 Score 0 0 0  Altered sleeping 0 0 0  Tired, decreased energy 1 0 0  Change in appetite 0 0 0  Feeling bad or failure about yourself  0 0 0  Trouble concentrating 0 0 0  Moving slowly or fidgety/restless 0 0 0  Suicidal thoughts 0 0 0  PHQ-9 Score 1 0 0  Difficult doing work/chores Not difficult at all Not difficult at all Not difficult at all    BP Readings from Last 3 Encounters:  03/13/23 124/70  02/03/23 128/78  11/08/22 122/68    Physical Exam Vitals and nursing note reviewed.  Constitutional:      General: She is not in acute distress.     Appearance: Normal appearance. She is well-developed.  HENT:     Head: Normocephalic and atraumatic.  Neck:     Vascular: No carotid bruit.  Cardiovascular:     Rate and Rhythm: Normal rate and regular rhythm.     Heart sounds: No murmur heard. Pulmonary:     Effort: Pulmonary effort is normal. No respiratory distress.     Breath sounds: No wheezing or rhonchi.  Musculoskeletal:     Cervical back: Normal range of motion.     Right lower leg: No edema.     Left lower leg: No edema.  Lymphadenopathy:     Cervical: No cervical adenopathy.  Skin:    General: Skin is warm and dry.     Capillary Refill: Capillary refill takes less than 2 seconds.     Findings: No rash.  Neurological:     General: No focal deficit present.     Mental Status: She is alert and oriented to person, place, and time.  Psychiatric:        Mood and Affect: Mood normal.        Behavior: Behavior normal.     Wt Readings from Last 3 Encounters:  03/13/23 203 lb 9.6 oz (92.4 kg)  02/03/23 207 lb (93.9 kg)  11/08/22 201 lb (91.2 kg)    BP 124/70   Pulse 76   Ht 5\' 7"  (1.702 m)   Wt 203 lb 9.6 oz (92.4 kg)   SpO2 98%   BMI 31.89 kg/m   Assessment and Plan:  Problem List Items Addressed This Visit       Unprioritized   Type II diabetes mellitus with complication (HCC) (Chronic)    Diet controlled diabetes. Lab Results  Component Value Date   HGBA1C 6.9 (H) 11/08/2022         Relevant Medications   glucose blood (ONETOUCH ULTRA) test strip   OneTouch Delica Lancets 33G MISC   Other Relevant Orders   Hemoglobin A1c   Essential hypertension - Primary (Chronic)    Controlled BP with normal exam. Current regimen is  amlodipine and Avapro. Will continue same medications; encourage continued reduced sodium diet.       Gastroesophageal reflux disease (Chronic)    Reflux symptoms are minimal on current therapy - diet changes.  She uses Pepcid rarely. No red flag signs such as weight loss,  n/v, melena       Relevant Medications   famotidine (PEPCID) 40 MG tablet   Stage 3a chronic kidney disease (HCC) (Chronic)    Will continue to monitor regularly as long as patient uses NSAIDS. She is currently using tylenol intermittently and Meloxicam about 2 times per week.      Relevant Orders   Basic metabolic panel   Other Visit Diagnoses     Need for COVID-19 vaccine       Relevant Orders   Pfizer Comirnaty Covid -19 Vaccine 37yrs and older (Completed)       Return in about 4 months (around 07/11/2023) for DM, HTN.    Reubin Milan, MD [facility] [place]

## 2023-03-13 NOTE — Assessment & Plan Note (Signed)
Controlled BP with normal exam. Current regimen is amlodipine and Avapro. Will continue same medications; encourage continued reduced sodium diet.

## 2023-03-13 NOTE — Assessment & Plan Note (Addendum)
Will continue to monitor regularly as long as patient uses NSAIDS. She is currently using tylenol intermittently and Meloxicam about 2 times per week.

## 2023-03-14 LAB — BASIC METABOLIC PANEL
BUN/Creatinine Ratio: 16 (ref 12–28)
BUN: 15 mg/dL (ref 8–27)
CO2: 21 mmol/L (ref 20–29)
Calcium: 10.2 mg/dL (ref 8.7–10.3)
Chloride: 103 mmol/L (ref 96–106)
Creatinine, Ser: 0.95 mg/dL (ref 0.57–1.00)
Glucose: 147 mg/dL — ABNORMAL HIGH (ref 70–99)
Potassium: 4.4 mmol/L (ref 3.5–5.2)
Sodium: 141 mmol/L (ref 134–144)
eGFR: 60 mL/min/{1.73_m2} (ref >59)

## 2023-03-14 LAB — HEMOGLOBIN A1C
Est. average glucose Bld gHb Est-mCnc: 148 mg/dL
Hgb A1c MFr Bld: 6.8 % — ABNORMAL HIGH (ref 4.8–5.6)

## 2023-05-01 DIAGNOSIS — G4733 Obstructive sleep apnea (adult) (pediatric): Secondary | ICD-10-CM | POA: Diagnosis not present

## 2023-05-05 ENCOUNTER — Ambulatory Visit: Payer: Self-pay | Admitting: *Deleted

## 2023-05-05 ENCOUNTER — Ambulatory Visit (INDEPENDENT_AMBULATORY_CARE_PROVIDER_SITE_OTHER): Payer: Medicare HMO | Admitting: Internal Medicine

## 2023-05-05 ENCOUNTER — Encounter: Payer: Self-pay | Admitting: Internal Medicine

## 2023-05-05 VITALS — BP 128/78 | HR 78 | Ht 67.0 in | Wt 198.2 lb

## 2023-05-05 DIAGNOSIS — M5431 Sciatica, right side: Secondary | ICD-10-CM | POA: Diagnosis not present

## 2023-05-05 NOTE — Progress Notes (Signed)
Date:  05/05/2023   Name:  Molly Ruiz   DOB:  February 23, 1941   MRN:  [id number]   Chief Complaint: Leg Problem (Pt in clinic today c/o numbness in right leg)  Leg Pain  Incident onset: leaned over today and felt numbness going down her right leg to her foot. The incident occurred at home. The injury mechanism was a twisting injury. The pain is present in the right leg. The patient is experiencing no pain. Associated symptoms include numbness and tingling (down the lateral leg much improved; now just minor tingling at the ankle). She has tried nothing for the symptoms.    Review of Systems  Constitutional:  Negative for chills, fatigue and fever.  Respiratory:  Negative for chest tightness and shortness of breath.   Cardiovascular:  Negative for chest pain.  Gastrointestinal:  Negative for abdominal pain.  Musculoskeletal:  Negative for back pain and gait problem.  Neurological:  Positive for tingling (down the lateral leg much improved; now just minor tingling at the ankle) and numbness.     Lab Results  Component Value Date   NA 141 03/13/2023   K 4.4 03/13/2023   CO2 21 03/13/2023   GLUCOSE 147 (H) 03/13/2023   BUN 15 03/13/2023   CREATININE 0.95 03/13/2023   CALCIUM 10.2 03/13/2023   EGFR 60 03/13/2023   GFRNONAA 55 (L) 11/25/2019   Lab Results  Component Value Date   CHOL 189 11/08/2022   HDL 68 11/08/2022   LDLCALC 95 11/08/2022   TRIG 155 (H) 11/08/2022   CHOLHDL 2.8 11/08/2022   Lab Results  Component Value Date   TSH 1.420 11/08/2022   Lab Results  Component Value Date   HGBA1C 6.8 (H) 03/13/2023   Lab Results  Component Value Date   WBC 4.4 11/08/2022   HGB 13.0 11/08/2022   HCT 39.1 11/08/2022   MCV 89 11/08/2022   PLT 216 11/08/2022   Lab Results  Component Value Date   ALT 13 11/08/2022   AST 21 11/08/2022   ALKPHOS 123 (H) 11/08/2022   BILITOT 0.4 11/08/2022   Lab Results  Component Value Date   VD25OH 37.3 11/02/2021     Patient  Active Problem List   Diagnosis Date Noted   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 03/09/2022   Stage 3a chronic kidney disease (HCC) 11/29/2021   Acute pain of right shoulder 09/24/2020   Statin myopathy 03/31/2020   Gastroesophageal reflux disease 09/19/2019   Special screening for malignant neoplasms, colon    Tubular adenoma of colon    Anxiety disorder 06/13/2019   Osteopenia determined by x-ray 06/06/2018   Essential hypertension 04/26/2018   Eczema 10/25/2017   Slow transit constipation 02/06/2017   Primary osteoarthritis of left hip 01/12/2017   OSA on CPAP 01/02/2017   Type II diabetes mellitus with complication (HCC) 06/24/2016   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   Hyperlipidemia associated with type 2 diabetes mellitus (HCC) 06/24/2016   Obesity (BMI 30.0-34.9) 06/24/2016   Vitamin D deficiency 06/24/2016   History of shingles 06/24/2016   Allergic rhinitis 06/24/2016    Allergies  Allergen Reactions   Atorvastatin Other (See Comments)    Peripheral neuropathy and myalgia   Influenza Vaccines Rash    Past Surgical History:  Procedure Laterality Date   ABDOMINAL HYSTERECTOMY     BILATERAL CARPAL TUNNEL RELEASE     CATARACT EXTRACTION W/PHACO Right 11/12/2018   Procedure: CATARACT EXTRACTION PHACO AND  INTRAOCULAR LENS PLACEMENT (IOC)  RIGHT DIABETIC;  Surgeon: Nevada Crane, MD;  Location: [facility];  Service: Ophthalmology;  Laterality: Right;  Diabetic - diet controlled sleep apnea   CERVICAL FUSION  2004   COLONOSCOPY WITH PROPOFOL N/A 07/22/2019   Procedure: COLONOSCOPY WITH BIOPSIES;  Surgeon: Midge Minium, MD;  Location: [facility];  Service: Endoscopy;  Laterality: N/A;  Diabetic - diet controlled priority 4   FOOT SURGERY     HAND SURGERY Left 06/2022   palmar cyst removed   LUMBAR DISC SURGERY  1998   POLYPECTOMY N/A 07/22/2019   Procedure: POLYPECTOMY;  Surgeon: Midge Minium, MD;  Location: [facility];  Service: Endoscopy;  Laterality: N/A;    Social History   Tobacco Use   Smoking status: Never   Smokeless tobacco: Never  Vaping Use   Vaping status: Never Used  Substance Use Topics   Alcohol use: No   Drug use: No     Medication list has been reviewed and updated.  Current Meds  Medication Sig   acetaminophen (TYLENOL) 500 MG tablet Take 2 tablets (1,000 mg total) by mouth 2 (two) times daily.   amLODipine (NORVASC) 5 MG tablet Take 1 tablet by mouth once daily   Bisacodyl (LAXATIVE PO) Take by mouth. Generic OTC PRN   buPROPion (WELLBUTRIN XL) 300 MG 24 hr tablet Take 1 tablet by mouth once daily   CALCIUM CITRATE PO Take 1,200 mg by mouth daily.   cholecalciferol (VITAMIN D3) 25 MCG (1000 UNIT) tablet Take 1,000 Units by mouth daily.   ezetimibe (ZETIA) 10 MG tablet Take 1 tablet by mouth once daily   famotidine (PEPCID) 40 MG tablet Take 1 tablet (40 mg total) by mouth at bedtime.   glucose blood (ONETOUCH ULTRA) test strip Test Blood Sugar twice daily.   irbesartan (AVAPRO) 300 MG tablet Take 1 tablet by mouth once daily   meloxicam (MOBIC) 15 MG tablet Take 1 tablet by mouth once daily   Multiple Vitamins-Minerals (MULTIVITAMIN ADULTS 50+ PO) Take 1 tablet by mouth daily.   mupirocin ointment (BACTROBAN) 2 % Apply 1 Application topically 2 (two) times daily.   NON FORMULARY CPAP @@ 12 cm H20   nystatin cream (MYCOSTATIN) Apply to affected area 2 times daily   Omega-3 Fatty Acids (FISH OIL) 1000 MG CAPS Take 1 capsule by mouth daily.   OneTouch Delica Lancets 33G MISC Inject 1 each into the skin daily.   REFRESH 1.4-0.6 % SOLN Place 1 drop into both eyes as needed.       05/05/2023   11:28 AM 03/13/2023   10:55 AM 02/03/2023    3:19 PM 11/08/2022    8:11 AM  GAD 7 : Generalized Anxiety Score  Nervous, Anxious, on Edge 0 0 0 0  Control/stop worrying 0 0 0 0  Worry too much - different things 0 0 0 0  Trouble relaxing 0 0 0 0  Restless 0 0 0 0   Easily annoyed or irritable 0 0 0 0  Afraid - awful might happen 0 0 0 0  Total GAD 7 Score 0 0 0 0  Anxiety Difficulty Not difficult at all Not difficult at all Not difficult at all Not difficult at all       05/05/2023   11:27 AM 03/13/2023   10:55 AM 02/03/2023    3:19 PM  Depression screen PHQ 2/9  Decreased Interest 0 0 0  Down, Depressed, Hopeless 0 0 0  PHQ - 2 Score 0 0 0  Altered sleeping 0 0 0  Tired, decreased energy 0 1 0  Change in appetite 0 0 0  Feeling bad or failure about yourself  0 0 0  Trouble concentrating 0 0 0  Moving slowly or fidgety/restless 0 0 0  Suicidal thoughts 0 0 0  PHQ-9 Score 0 1 0  Difficult doing work/chores Not difficult at all Not difficult at all Not difficult at all    BP Readings from Last 3 Encounters:  05/05/23 128/78  03/13/23 124/70  02/03/23 128/78    Physical Exam Vitals and nursing note reviewed.  Constitutional:      General: She is not in acute distress.    Appearance: She is well-developed.  HENT:     Head: Normocephalic and atraumatic.  Cardiovascular:     Rate and Rhythm: Normal rate and regular rhythm.  Pulmonary:     Effort: Pulmonary effort is normal. No respiratory distress.     Breath sounds: No wheezing or rhonchi.  Musculoskeletal:     Lumbar back: No spasms or tenderness. Negative right straight leg raise test and negative left straight leg raise test.     Right hip: Normal.     Left hip: Normal.     Right knee: Normal.     Left knee: Normal.  Skin:    General: Skin is warm and dry.     Findings: No rash.  Neurological:     Mental Status: She is alert and oriented to person, place, and time.     Motor: Motor function is intact.     Coordination: Coordination is intact.     Gait: Gait is intact.     Deep Tendon Reflexes:     Reflex Scores:      Patellar reflexes are 1+ on the right side and 1+ on the left side.      Achilles reflexes are 1+ on the right side and 1+ on the left side. Psychiatric:         Mood and Affect: Mood normal.        Behavior: Behavior normal.     Wt Readings from Last 3 Encounters:  05/05/23 198 lb 3.2 oz (89.9 kg)  03/13/23 203 lb 9.6 oz (92.4 kg)  02/03/23 207 lb (93.9 kg)    BP 128/78   Pulse 78   Ht 5\' 7"  (1.702 m)   Wt 198 lb 3.2 oz (89.9 kg)   SpO2 97%   BMI 31.04 kg/m   Assessment and Plan:  Problem List Items Addressed This Visit   None Visit Diagnoses       Right sided sciatica    -  Primary   Avoid excessive stretching/twisting symptoms already resolving with no intervention.  use Heat and take tylenol if discomfort occurs       No follow-ups on file.    Reubin Milan, MD [facility] [place]

## 2023-05-05 NOTE — Telephone Encounter (Signed)
  Chief Complaint: right leg numbness Symptoms: woke up with right leg numbness from sleep. Reports numbness usually in bilateral feet.  Can walk. Reports hx of weakness standing. Can not stand for long periods of time. Denies any other neurological sx  Frequency: this am  Pertinent Negatives: Patient denies chest pain no difficulty breathing no headache no N/T face or any other area of body. No blurred vision Disposition: [] ED /[] Urgent Care (no appt availability in office) / [x] Appointment(In office/virtual)/ []  [facility]/ [] Home Care/ [] Refused Recommended Disposition /[] [facility]/ []  Follow-up with PCP Additional Notes:   Appt already scheduled today . Recommended if sx worsen call back or go to ED or call 911.        Reason for Disposition  [1] Numbness (i.e., loss of sensation) of the face, arm / hand, or leg / foot on one side of the body AND [2] gradual onset (e.g., days to weeks) AND [3] present now  Answer Assessment - Initial Assessment Questions 1. SYMPTOM: "What is the main symptom you are concerned about?" (e.g., weakness, numbness)     Numbness under feet now in right leg 2. ONSET: "When did this start?" (minutes, hours, days; while sleeping)     This am while sleeping  3. LAST NORMAL: "When was the last time you (the patient) were normal (no symptoms)?"     Last night  4. PATTERN "Does this come and go, or has it been constant since it started?"  "Is it present now?"     Numbness around ankle  5. CARDIAC SYMPTOMS: "Have you had any of the following symptoms: chest pain, difficulty breathing, palpitations?"     Na  6. 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 OTHER SYMPTOMS: "Do you have any other symptoms?"     Na  8. PREGNANCY: "Is there any chance you are pregnant?" "When was  your last menstrual period?"     na  Protocols used: Neurologic Deficit-A-AH

## 2023-05-05 NOTE — Telephone Encounter (Signed)
Pt has an appt.  KP

## 2023-06-01 ENCOUNTER — Other Ambulatory Visit: Payer: Self-pay | Admitting: Internal Medicine

## 2023-06-01 DIAGNOSIS — Z1231 Encounter for screening mammogram for malignant neoplasm of breast: Secondary | ICD-10-CM

## 2023-06-26 ENCOUNTER — Ambulatory Visit
Admission: RE | Admit: 2023-06-26 | Discharge: 2023-06-26 | Disposition: A | Discharge status: Home / Self Care | Payer: Medicare (Managed Care) | Source: Ambulatory Visit | Attending: Internal Medicine | Admitting: Internal Medicine

## 2023-06-26 DIAGNOSIS — Z1231 Encounter for screening mammogram for malignant neoplasm of breast: Secondary | ICD-10-CM | POA: Diagnosis present

## 2023-06-28 ENCOUNTER — Ambulatory Visit: Payer: Medicare (Managed Care) | Admitting: Internal Medicine

## 2023-06-28 ENCOUNTER — Ambulatory Visit: Payer: Self-pay | Admitting: Internal Medicine

## 2023-07-04 ENCOUNTER — Other Ambulatory Visit: Payer: Self-pay | Admitting: Internal Medicine

## 2023-07-04 DIAGNOSIS — F419 Anxiety disorder, unspecified: Secondary | ICD-10-CM

## 2023-07-05 ENCOUNTER — Ambulatory Visit (INDEPENDENT_AMBULATORY_CARE_PROVIDER_SITE_OTHER): Payer: Medicare (Managed Care)

## 2023-07-05 ENCOUNTER — Other Ambulatory Visit: Payer: Self-pay | Admitting: Internal Medicine

## 2023-07-05 ENCOUNTER — Telehealth: Payer: Self-pay

## 2023-07-05 DIAGNOSIS — Z0000 Encounter for general adult medical examination without abnormal findings: Secondary | ICD-10-CM | POA: Diagnosis not present

## 2023-07-05 DIAGNOSIS — F419 Anxiety disorder, unspecified: Secondary | ICD-10-CM

## 2023-07-05 DIAGNOSIS — E119 Type 2 diabetes mellitus without complications: Secondary | ICD-10-CM

## 2023-07-05 NOTE — Telephone Encounter (Signed)
 Please schedule an appointment.  KP

## 2023-07-05 NOTE — Progress Notes (Unsigned)
 Date:  07/05/2023   Name:  Molly Ruiz   DOB:  03/03/1941   MRN:  [id number]   Chief Complaint: No chief complaint on file.  HPI  Review of Systems   Lab Results  Component Value Date   NA 141 03/13/2023   K 4.4 03/13/2023   CO2 21 03/13/2023   GLUCOSE 147 (H) 03/13/2023   BUN 15 03/13/2023   CREATININE 0.95 03/13/2023   CALCIUM 10.2 03/13/2023   EGFR 60 03/13/2023   GFRNONAA 55 (L) 11/25/2019   Lab Results  Component Value Date   CHOL 189 11/08/2022   HDL 68 11/08/2022   LDLCALC 95 11/08/2022   TRIG 155 (H) 11/08/2022   CHOLHDL 2.8 11/08/2022   Lab Results  Component Value Date   TSH 1.420 11/08/2022   Lab Results  Component Value Date   HGBA1C 6.8 (H) 03/13/2023   Lab Results  Component Value Date   WBC 4.4 11/08/2022   HGB 13.0 11/08/2022   HCT 39.1 11/08/2022   MCV 89 11/08/2022   PLT 216 11/08/2022   Lab Results  Component Value Date   ALT 13 11/08/2022   AST 21 11/08/2022   ALKPHOS 123 (H) 11/08/2022   BILITOT 0.4 11/08/2022   Lab Results  Component Value Date   VD25OH 37.3 11/02/2021     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 03/09/2022   Stage 3a chronic kidney disease (HCC) 11/29/2021   Acute pain of right shoulder 09/24/2020   Statin myopathy 03/31/2020   Gastroesophageal reflux disease 09/19/2019   Special screening for malignant neoplasms, colon    Tubular adenoma of colon    Anxiety disorder 06/13/2019   Osteopenia determined by x-ray 06/06/2018   Essential hypertension 04/26/2018   Eczema 10/25/2017   Slow transit constipation 02/06/2017   Primary osteoarthritis of left hip 01/12/2017   OSA on CPAP 01/02/2017   Type II diabetes mellitus with complication (HCC) 06/24/2016   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   Hyperlipidemia associated with type 2 diabetes mellitus (HCC) 06/24/2016   Obesity (BMI 30.0-34.9) 06/24/2016   Vitamin D  deficiency 06/24/2016   History of shingles 06/24/2016   Allergic rhinitis 06/24/2016    Allergies  Allergen Reactions   Atorvastatin Other (See Comments)    Peripheral neuropathy and myalgia   Influenza Vaccines Rash    Past Surgical History:  Procedure Laterality Date   ABDOMINAL HYSTERECTOMY     BILATERAL CARPAL TUNNEL RELEASE     CATARACT EXTRACTION W/PHACO Right 11/12/2018   Procedure: CATARACT EXTRACTION PHACO AND INTRAOCULAR LENS PLACEMENT (IOC)  RIGHT DIABETIC;  Surgeon: Nevada Crane, MD;  Location: [facility];  Service: Ophthalmology;  Laterality: Right;  Diabetic - diet controlled sleep apnea   CERVICAL FUSION  2004   COLONOSCOPY WITH PROPOFOL N/A 07/22/2019   Procedure: COLONOSCOPY WITH BIOPSIES;  Surgeon: Midge Minium, MD;  Location: [facility];  Service: Endoscopy;  Laterality: N/A;  Diabetic - diet controlled priority 4   FOOT SURGERY     HAND SURGERY Left 06/2022   palmar cyst removed   LUMBAR DISC SURGERY  1998   POLYPECTOMY N/A 07/22/2019   Procedure: POLYPECTOMY;  Surgeon: Midge Minium, MD;  Location: [facility];  Service: Endoscopy;  Laterality: N/A;    Social History   Tobacco Use   Smoking status: Never   Smokeless tobacco: Never  Vaping Use  Vaping status: Never Used  Substance Use Topics   Alcohol use: No   Drug use: No     Medication list has been reviewed and updated.  No outpatient medications have been marked as taking for the 07/05/23 encounter (Orders Only) with Reubin Milan, MD.       05/05/2023   11:28 AM 03/13/2023   10:55 AM 02/03/2023    3:19 PM 11/08/2022    8:11 AM  GAD 7 : Generalized Anxiety Score  Nervous, Anxious, on Edge 0 0 0 0  Control/stop worrying 0 0 0 0  Worry too much - different things 0 0 0 0  Trouble relaxing 0 0 0 0  Restless 0 0 0 0  Easily annoyed or irritable 0 0 0 0  Afraid - awful might happen 0 0 0 0  Total GAD 7 Score 0 0 0 0  Anxiety Difficulty Not difficult at  all Not difficult at all Not difficult at all Not difficult at all       07/05/2023    2:17 PM 05/05/2023   11:27 AM 03/13/2023   10:55 AM  Depression screen PHQ 2/9  Decreased Interest 0 0 0  Down, Depressed, Hopeless 0 0 0  PHQ - 2 Score 0 0 0  Altered sleeping 0 0 0  Tired, decreased energy 0 0 1  Change in appetite 0 0 0  Feeling bad or failure about yourself  0 0 0  Trouble concentrating 0 0 0  Moving slowly or fidgety/restless 0 0 0  Suicidal thoughts 0 0 0  PHQ-9 Score 0 0 1  Difficult doing work/chores Not difficult at all Not difficult at all Not difficult at all    BP Readings from Last 3 Encounters:  05/05/23 128/78  03/13/23 124/70  02/03/23 128/78    Physical Exam  Wt Readings from Last 3 Encounters:  05/05/23 198 lb 3.2 oz (89.9 kg)  03/13/23 203 lb 9.6 oz (92.4 kg)  02/03/23 207 lb (93.9 kg)    There were no vitals taken for this visit.  Assessment and Plan:  Problem List Items Addressed This Visit   None   No follow-ups on file.    Reubin Milan, MD [facility] [place]

## 2023-07-05 NOTE — Patient Instructions (Addendum)
 Molly Ruiz , Thank you for taking time to come for your Medicare Wellness Visit. I appreciate your ongoing commitment to your health goals. Please review the following plan we discussed and let me know if I can assist you in the future.   Referrals/Orders/Follow-Ups/Clinician Recommendations: REFERRAL SENT FOR DIABETIC CLASS  This is a list of the screening recommended for you and due dates:  Health Maintenance  Topic Date Due   Flu Shot  08/07/2023*   Hemoglobin A1C  09/10/2023   Yearly kidney health urinalysis for diabetes  11/08/2023   Complete foot exam   11/08/2023   Eye exam for diabetics  12/06/2023   Yearly kidney function blood test for diabetes  03/12/2024   Mammogram  06/25/2024   Medicare Annual Wellness Visit  07/04/2024   Colon Cancer Screening  07/21/2024   DTaP/Tdap/Td vaccine (2 - Td or Tdap) 06/24/2026   Pneumonia Vaccine  Completed   DEXA scan (bone density measurement)  Completed   COVID-19 Vaccine  Completed   Zoster (Shingles) Vaccine  Completed   HPV Vaccine  Aged Out   Hepatitis C Screening  Discontinued  *Topic was postponed. The date shown is not the original due date.    Advanced directives: (ACP Link)Information on Advanced Care Planning can be found at [facility] Advance Health Care Directives Advance Health Care Directives ([email or web address]   Next Medicare Annual Wellness Visit scheduled for next year: Yes   07/10/24 @ 12:40 PM BY PHONE

## 2023-07-05 NOTE — Telephone Encounter (Signed)
-----   Message from Bari Edward sent at 07/05/2023  2:38 PM EST ----- Regarding: FW: REFERRAL I have put in a Psych referral but she still needs to see me next month for DM follow up.  It looks like she cancelled that appointment. ----- Message ----- From: Hal Hope, LPN Sent: 1/61/0960   2:32 PM EST To: Reubin Milan, MD Subject: REFERRAL                                       Hi Dr.Berglund, this lady would like a referral to "[facility]" for counseling/psych meds. She called them and they told her she would have to have a referral from her PCP. Thanks! Lorrie

## 2023-07-05 NOTE — Telephone Encounter (Signed)
 Requested Prescriptions  Pending Prescriptions Disp Refills   buPROPion (WELLBUTRIN XL) 300 MG 24 hr tablet [Pharmacy Med Name: buPROPion HCl ER (XL) 300 MG Oral Tablet Extended Release 24 Hour] 90 tablet 0    Sig: Take 1 tablet by mouth once daily     Psychiatry: Antidepressants - bupropion Passed - 07/05/2023  9:35 AM      Passed - Cr in normal range and within 360 days    Creatinine, Ser  Date Value Ref Range Status  03/13/2023 0.95 0.57 - 1.00 mg/dL Final         Passed - AST in normal range and within 360 days    AST  Date Value Ref Range Status  11/08/2022 21 0 - 40 IU/L Final         Passed - ALT in normal range and within 360 days    ALT  Date Value Ref Range Status  11/08/2022 13 0 - 32 IU/L Final         Passed - Last BP in normal range    BP Readings from Last 1 Encounters:  05/05/23 128/78         Passed - Valid encounter within last 6 months    Recent Outpatient Visits           2 months ago Right sided sciatica   [place] [facility] at [facility], Nyoka Cowden, MD   3 months ago Essential hypertension   [place] [facility] at [facility], Nyoka Cowden, MD   5 months ago Right hand pain   [place] [facility] at [facility], Nyoka Cowden, MD   7 months ago Annual physical exam   [facility] at [facility], Nyoka Cowden, MD   12 months ago Type II diabetes mellitus with complication [facility])    Primary Care & Sports Medicine at [facility], Nyoka Cowden, MD

## 2023-07-05 NOTE — Progress Notes (Signed)
 Subjective:   Molly Ruiz is a 83 y.o. who presents for a Medicare Wellness preventive visit.  Visit Complete: Virtual I connected with  Molly Ruiz on 07/05/23 by a audio enabled telemedicine application and verified that I am speaking with the correct person using two identifiers.  Patient Location: Home  Provider Location: Office/Clinic  I discussed the limitations of evaluation and management by telemedicine. The patient expressed understanding and agreed to proceed.  Vital Signs: Because this visit was a virtual/telehealth visit, some criteria may be missing or patient reported. Any vitals not documented were not able to be obtained and vitals that have been documented are patient reported.  VideoDeclined- This patient declined Librarian, academic. Therefore the visit was completed with audio only.  AWV Questionnaire: No: Patient Medicare AWV questionnaire was not completed prior to this visit.  Cardiac Risk Factors include: advanced age (>32men, >13 women);diabetes mellitus;dyslipidemia;obesity (BMI >30kg/m2);hypertension;sedentary lifestyle     Objective:    There were no vitals filed for this visit. There is no height or weight on file to calculate BMI.     07/05/2023    2:19 PM 06/30/2022   10:36 AM 05/24/2021    3:46 PM 05/13/2020   10:22 AM 07/22/2019    8:25 AM 05/13/2019   10:19 AM 04/18/2018   11:10 AM  Advanced Directives  Does Patient Have a Medical Advance Directive? Yes Yes Yes Yes Yes No Yes  Type of Estate agent of [place];Living will Healthcare Power of [place];Living will Healthcare Power of [place];Living will Healthcare Power of [place];Living will Healthcare Power of [employer] of [place];Living will  Does patient want to make changes to medical advance directive? No - Patient declined No - Patient declined   No - Patient declined Yes (MAU/Ambulatory/Procedural Areas - Information  given)   Copy of Healthcare Power of Attorney in Chart? Yes - validated most recent copy scanned in chart (See row information) Yes - validated most recent copy scanned in chart (See row information) Yes - validated most recent copy scanned in chart (See row information) Yes - validated most recent copy scanned in chart (See row information) Yes - validated most recent copy scanned in chart (See row information)  No - copy requested    Current Medications (verified) Outpatient Encounter Medications as of 07/05/2023  Medication Sig   acetaminophen (TYLENOL) 500 MG tablet Take 2 tablets (1,000 mg total) by mouth 2 (two) times daily.   amLODipine (NORVASC) 5 MG tablet Take 1 tablet by mouth once daily   Bisacodyl (LAXATIVE PO) Take by mouth. Generic OTC PRN   buPROPion (WELLBUTRIN XL) 300 MG 24 hr tablet Take 1 tablet by mouth once daily   CALCIUM CITRATE PO Take 1,200 mg by mouth daily.   cholecalciferol (VITAMIN D3) 25 MCG (1000 UNIT) tablet Take 1,000 Units by mouth daily.   ezetimibe (ZETIA) 10 MG tablet Take 1 tablet by mouth once daily   famotidine (PEPCID) 40 MG tablet Take 1 tablet (40 mg total) by mouth at bedtime.   glucose blood (ONETOUCH ULTRA) test strip Test Blood Sugar twice daily.   irbesartan (AVAPRO) 300 MG tablet Take 1 tablet by mouth once daily   meloxicam (MOBIC) 15 MG tablet Take 1 tablet by mouth once daily   Multiple Vitamins-Minerals (MULTIVITAMIN ADULTS 50+ PO) Take 1 tablet by mouth daily.   mupirocin ointment (BACTROBAN) 2 % Apply 1 Application topically 2 (two) times daily.   NON FORMULARY CPAP @@  12 cm H20   nystatin cream (MYCOSTATIN) Apply to affected area 2 times daily   Omega-3 Fatty Acids (FISH OIL) 1000 MG CAPS Take 1 capsule by mouth daily.   OneTouch Delica Lancets 33G MISC Inject 1 each into the skin daily.   REFRESH 1.4-0.6 % SOLN Place 1 drop into both eyes as needed.   No facility-administered encounter medications on file as of 07/05/2023.     Allergies (verified) Atorvastatin and Influenza vaccines   History: Past Medical History:  Diagnosis Date   Complication of anesthesia    Hair falls out.   Depression    Diabetes mellitus without complication (HCC)    Hyperlipidemia    Hypertension    Sleep apnea    CPAP   Wears dentures    partial upper   Past Surgical History:  Procedure Laterality Date   ABDOMINAL HYSTERECTOMY     BILATERAL CARPAL TUNNEL RELEASE     CATARACT EXTRACTION W/PHACO Right 11/12/2018   Procedure: CATARACT EXTRACTION PHACO AND INTRAOCULAR LENS PLACEMENT (IOC)  RIGHT DIABETIC;  Surgeon: Nevada Crane, MD;  Location: [facility];  Service: Ophthalmology;  Laterality: Right;  Diabetic - diet controlled sleep apnea   CERVICAL FUSION  2004   COLONOSCOPY WITH PROPOFOL N/A 07/22/2019   Procedure: COLONOSCOPY WITH BIOPSIES;  Surgeon: Midge Minium, MD;  Location: [facility];  Service: Endoscopy;  Laterality: N/A;  Diabetic - diet controlled priority 4   FOOT SURGERY     HAND SURGERY Left 06/2022   palmar cyst removed   LUMBAR DISC SURGERY  1998   POLYPECTOMY N/A 07/22/2019   Procedure: POLYPECTOMY;  Surgeon: Midge Minium, MD;  Location: [facility];  Service: Endoscopy;  Laterality: N/A;   Family History  Problem Relation Age of Onset   Diabetes Mother    Stroke Mother    Heart disease Sister    Stroke Brother    Prostate cancer Brother    Breast cancer Other 45   Social History   Socioeconomic History   Marital status: Widowed    Spouse name: Not on file   Number of children: 3   Years of education: 12   Highest education level: High school graduate  Occupational History   Occupation: Retired  Tobacco Use   Smoking status: Never   Smokeless tobacco: Never  Vaping Use   Vaping status: Never Used  Substance and Sexual Activity   Alcohol use: No   Drug use: No   Sexual activity: Not Currently  Other Topics Concern   Not on file  Social History  Narrative   Pt lives with her daughter   Social Drivers of Health   Financial Resource Strain: Low Risk  (07/05/2023)   Overall Financial Resource Strain (CARDIA)    Difficulty of Paying Living Expenses: Not hard at all  Food Insecurity: No Food Insecurity (07/05/2023)   Hunger Vital Sign    Worried About Running Out of Food in the Last Year: Never true    Ran Out of Food in the Last Year: Never true  Transportation Needs: No Transportation Needs (07/05/2023)   PRAPARE - Administrator, Civil Service (Medical): No    Lack of Transportation (Non-Medical): No  Physical Activity: Insufficiently Active (07/05/2023)   Exercise Vital Sign    Days of Exercise per Week: 2 days    Minutes of Exercise per Session: 30 min  Stress: No Stress Concern Present (07/05/2023)   [employer] of Occupational Health - Occupational  Stress Questionnaire    Feeling of Stress : Not at all  Social Connections: Moderately Isolated (07/05/2023)   Social Connection and Isolation Panel [NHANES]    Frequency of Communication with Friends and Family: More than three times a week    Frequency of Social Gatherings with Friends and Family: Three times a week    Attends Religious Services: More than 4 times per year    Active Member of Clubs or Organizations: No    Attends Banker Meetings: Never    Marital Status: Widowed    Tobacco Counseling Counseling given: Not Answered    Clinical Intake:  Pre-visit preparation completed: Yes  Pain : No/denies pain     BMI - recorded: 31 Nutritional Status: BMI > 30  Obese Nutritional Risks: None Diabetes: Yes CBG done?: No Did pt. bring in CBG monitor from home?: No  How often do you need to have someone help you when you read instructions, pamphlets, or other written materials from your doctor or pharmacy?: 1 - Never  Interpreter Needed?: No  Information entered by :: Kennedy Bucker, LPN   Activities of Daily Living      07/05/2023    2:21 PM  In your present state of health, do you have any difficulty performing the following activities:  Hearing? 0  Vision? 0  Difficulty concentrating or making decisions? 0  Walking or climbing stairs? 1  Dressing or bathing? 0  Doing errands, shopping? 0  Preparing Food and eating ? N  Using the Toilet? N  In the past six months, have you accidently leaked urine? N  Do you have problems with loss of bowel control? N  Managing your Medications? N  Managing your Finances? N  Housekeeping or managing your Housekeeping? N    Patient Care Team: Reubin Milan, MD as PCP - General (Internal Medicine) [facility] (Ophthalmology) Nevada Crane, MD as Consulting Physician (Ophthalmology)  Indicate any recent Medical Services you may have received from other than [facility] providers in the past year (date may be approximate).     Assessment:   This is a routine wellness examination for Janika.  Hearing/Vision screen Hearing Screening - Comments:: NO AIDS Vision Screening - Comments:: WEARS GLASSES ALL THE TIME- DR.KING   Goals Addressed             This Visit's Progress    Cut out extra servings         Depression Screen     07/05/2023    2:17 PM 05/05/2023   11:27 AM 03/13/2023   10:55 AM 02/03/2023    3:19 PM 11/08/2022    8:10 AM 07/08/2022   10:46 AM 06/30/2022   10:34 AM  PHQ 2/9 Scores  PHQ - 2 Score 0 0 0 0 0 0 0  PHQ- 9 Score 0 0 1 0 0 2 0    Fall Risk     07/05/2023    2:20 PM 05/05/2023   11:27 AM 03/13/2023   10:55 AM 02/03/2023    3:19 PM 11/08/2022    8:10 AM  Fall Risk   Falls in the past year? 1 0 1 1 1   Number falls in past yr: 0 0 1 0 1  Injury with Fall? 0 0 0 1 0  Risk for fall due to : History of fall(s) No Fall Risks History of fall(s) Impaired balance/gait;History of fall(s) History of fall(s);Impaired balance/gait  Follow up Falls prevention discussed;Falls evaluation completed Falls evaluation  completed Falls  evaluation completed Falls evaluation completed Falls evaluation completed    MEDICARE RISK AT HOME:  Medicare Risk at Home Any stairs in or around the home?: No If so, are there any without handrails?: No Home free of loose throw rugs in walkways, pet beds, electrical cords, etc?: Yes Adequate lighting in your home to reduce risk of falls?: Yes Life alert?: No Use of a cane, walker or w/c?: Yes (CANE OCCASIONALLY) Grab bars in the bathroom?: No Shower chair or bench in shower?: Yes Elevated toilet seat or a handicapped toilet?: No  TIMED UP AND GO:  Was the test performed?  No  Cognitive Function: 6CIT completed        07/05/2023    2:23 PM 06/30/2022   10:38 AM 05/13/2020   10:26 AM 05/13/2019   10:26 AM 04/18/2018   12:21 PM  6CIT Screen  What Year? 0 points 0 points 0 points 0 points 0 points  What month? 0 points 0 points 0 points 0 points 0 points  What time? 0 points 0 points 0 points 0 points 0 points  Count back from 20 0 points 0 points 0 points 0 points 0 points  Months in reverse 0 points 2 points 0 points 0 points 0 points  Repeat phrase 2 points 0 points 0 points 2 points 2 points  Total Score 2 points 2 points 0 points 2 points 2 points    Immunizations Immunization History  Administered Date(s) Administered   PFIZER(Purple Top)SARS-COV-2 Vaccination 07/04/2019, 07/30/2019, 03/21/2020   PNEUMOCOCCAL CONJUGATE-20 02/03/2022   Pfizer Covid-19 Vaccine Bivalent Booster 18yrs & up 10/25/2020   Pfizer Covid-19 Vaccine Bivalent Booster 5y-11y 10/29/2021   Pfizer(Comirnaty)Fall Seasonal Vaccine 12 years and older 03/13/2023   Pneumococcal Conjugate-13 06/24/2016   Pneumococcal Polysaccharide-23 08/23/2017   Tdap 06/24/2016   Zoster Recombinant(Shingrix) 10/29/2021, 02/03/2022    Screening Tests Health Maintenance  Topic Date Due   INFLUENZA VACCINE  08/07/2023 (Originally 12/08/2022)   HEMOGLOBIN A1C  09/10/2023   Diabetic kidney evaluation - Urine ACR   11/08/2023   FOOT EXAM  11/08/2023   OPHTHALMOLOGY EXAM  12/06/2023   Diabetic kidney evaluation - eGFR measurement  03/12/2024   MAMMOGRAM  06/25/2024   Medicare Annual Wellness (AWV)  07/04/2024   Colonoscopy  07/21/2024   DTaP/Tdap/Td (2 - Td or Tdap) 06/24/2026   Pneumonia Vaccine 13+ Years old  Completed   DEXA SCAN  Completed   COVID-19 Vaccine  Completed   Zoster Vaccines- Shingrix  Completed   HPV VACCINES  Aged Out   Hepatitis C Screening  Discontinued    Health Maintenance  There are no preventive care reminders to display for this patient. Health Maintenance Items Addressed: UP TO DATE, REFERRAL SENT FOR DIABETIC CLASS  Additional Screening:  Vision Screening: Recommended annual ophthalmology exams for early detection of glaucoma and other disorders of the eye.  Dental Screening: Recommended annual dental exams for proper oral hygiene  Community Resource Referral / Chronic Care Management: CRR required this visit?  No   CCM required this visit?  No     Plan:     I have personally reviewed and noted the following in the patient's chart:   Medical and social history Use of alcohol, tobacco or illicit drugs  Current medications and supplements including opioid prescriptions. Patient is not currently taking opioid prescriptions. Functional ability and status Nutritional status Physical activity Advanced directives List of other physicians Hospitalizations, surgeries, and ER visits in previous 12 months  Vitals Screenings to include cognitive, depression, and falls Referrals and appointments  In addition, I have reviewed and discussed with patient certain preventive protocols, quality metrics, and best practice recommendations. A written personalized care plan for preventive services as well as general preventive health recommendations were provided to patient.     Hal Hope, LPN   1/61/0960   After Visit Summary: (MyChart) Due to this being a  telephonic visit, the after visit summary with patients personalized plan was offered to patient via MyChart   Notes:  REFERRAL SENT FOR DIABETIC CLASS

## 2023-07-14 ENCOUNTER — Ambulatory Visit: Payer: Medicare (Managed Care) | Admitting: Internal Medicine

## 2023-07-14 ENCOUNTER — Encounter: Payer: Self-pay | Admitting: Internal Medicine

## 2023-07-14 VITALS — BP 118/78 | HR 86 | Ht 67.0 in | Wt 192.5 lb

## 2023-07-14 DIAGNOSIS — E1169 Type 2 diabetes mellitus with other specified complication: Secondary | ICD-10-CM

## 2023-07-14 DIAGNOSIS — N1831 Chronic kidney disease, stage 3a: Secondary | ICD-10-CM | POA: Diagnosis not present

## 2023-07-14 DIAGNOSIS — I10 Essential (primary) hypertension: Secondary | ICD-10-CM

## 2023-07-14 DIAGNOSIS — E118 Type 2 diabetes mellitus with unspecified complications: Secondary | ICD-10-CM | POA: Diagnosis not present

## 2023-07-14 DIAGNOSIS — E785 Hyperlipidemia, unspecified: Secondary | ICD-10-CM | POA: Diagnosis not present

## 2023-07-14 LAB — POCT GLYCOSYLATED HEMOGLOBIN (HGB A1C): Hemoglobin A1C: 6.4 % — AB (ref 4.0–5.6)

## 2023-07-14 NOTE — Assessment & Plan Note (Signed)
 Controlled BP with normal exam. Current regimen is Irbesartan and amlodipine. Will continue same medications; encourage continued reduced sodium diet.

## 2023-07-14 NOTE — Assessment & Plan Note (Signed)
 Stable for the past 5 years. Will recheck labs next visit at [facility]

## 2023-07-14 NOTE — Assessment & Plan Note (Addendum)
 Blood sugars stable without hypoglycemic symptoms or events. Currently managed with diet. She has eliminated meat from her diet and has lost 10 lbs.  She feels better than she has in a while. Lab Results  Component Value Date   HGBA1C 6.8 (H) 03/13/2023  A1C today = 6.4

## 2023-07-14 NOTE — Progress Notes (Signed)
 Date:  07/14/2023   Name:  Molly Ruiz   DOB:  07-09-1940   MRN:  [id number]   Chief Complaint: Hypertension and Diabetes  Hypertension This is a chronic problem. The problem is controlled. Pertinent negatives include no chest pain, headaches, palpitations or shortness of breath.  Diabetes Pertinent negatives for hypoglycemia include no headaches or tremors. Pertinent negatives for diabetes include no chest pain, no fatigue, no polydipsia and no polyuria.  Hyperlipidemia This is a chronic problem. The problem is uncontrolled. Pertinent negatives include no chest pain or shortness of breath. Current antihyperlipidemic treatment includes diet change.    Review of Systems  Constitutional:  Negative for appetite change, fatigue, fever and unexpected weight change.  HENT:  Negative for tinnitus and trouble swallowing.   Eyes:  Negative for visual disturbance.  Respiratory:  Negative for cough, chest tightness and shortness of breath.   Cardiovascular:  Negative for chest pain, palpitations and leg swelling.  Gastrointestinal:  Negative for abdominal pain.  Endocrine: Negative for polydipsia and polyuria.  Genitourinary:  Negative for dysuria and hematuria.  Musculoskeletal:  Negative for arthralgias.  Neurological:  Negative for tremors, numbness and headaches.  Psychiatric/Behavioral:  Negative for dysphoric mood.      Lab Results  Component Value Date   NA 141 03/13/2023   K 4.4 03/13/2023   CO2 21 03/13/2023   GLUCOSE 147 (H) 03/13/2023   BUN 15 03/13/2023   CREATININE 0.95 03/13/2023   CALCIUM 10.2 03/13/2023   EGFR 60 03/13/2023   GFRNONAA 55 (L) 11/25/2019   Lab Results  Component Value Date   CHOL 189 11/08/2022   HDL 68 11/08/2022   LDLCALC 95 11/08/2022   TRIG 155 (H) 11/08/2022   CHOLHDL 2.8 11/08/2022   Lab Results  Component Value Date   TSH 1.420 11/08/2022   Lab Results  Component Value Date   HGBA1C 6.4 (A) 07/14/2023   Lab Results  Component  Value Date   WBC 4.4 11/08/2022   HGB 13.0 11/08/2022   HCT 39.1 11/08/2022   MCV 89 11/08/2022   PLT 216 11/08/2022   Lab Results  Component Value Date   ALT 13 11/08/2022   AST 21 11/08/2022   ALKPHOS 123 (H) 11/08/2022   BILITOT 0.4 11/08/2022   Lab Results  Component Value Date   VD25OH 37.3 11/02/2021     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 03/09/2022   Stage 3a chronic kidney disease (HCC) 11/29/2021   Acute pain of right shoulder 09/24/2020   Statin myopathy 03/31/2020   Gastroesophageal reflux disease 09/19/2019   Special screening for malignant neoplasms, colon    Tubular adenoma of colon    Anxiety disorder 06/13/2019   Osteopenia determined by x-ray 06/06/2018   Essential hypertension 04/26/2018   Eczema 10/25/2017   Slow transit constipation 02/06/2017   Primary osteoarthritis of left hip 01/12/2017   OSA on CPAP 01/02/2017   Type II diabetes mellitus with complication (HCC) 06/24/2016   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   Hyperlipidemia associated with type 2 diabetes mellitus (HCC) 06/24/2016   Obesity (BMI 30.0-34.9) 06/24/2016   Vitamin D deficiency 06/24/2016   History of shingles 06/24/2016   Allergic rhinitis 06/24/2016    Allergies  Allergen Reactions   Atorvastatin Other (See Comments)    Peripheral neuropathy and myalgia   Influenza Vaccines Rash    Past Surgical History:  Procedure Laterality  Date   ABDOMINAL HYSTERECTOMY     BILATERAL CARPAL TUNNEL RELEASE     CATARACT EXTRACTION W/PHACO Right 11/12/2018   Procedure: CATARACT EXTRACTION PHACO AND INTRAOCULAR LENS PLACEMENT (IOC)  RIGHT DIABETIC;  Surgeon: Nevada Crane, MD;  Location: [facility];  Service: Ophthalmology;  Laterality: Right;  Diabetic - diet controlled sleep apnea   CERVICAL FUSION  2004   COLONOSCOPY WITH PROPOFOL N/A 07/22/2019   Procedure: COLONOSCOPY WITH BIOPSIES;   Surgeon: Midge Minium, MD;  Location: [facility];  Service: Endoscopy;  Laterality: N/A;  Diabetic - diet controlled priority 4   FOOT SURGERY     HAND SURGERY Left 06/2022   palmar cyst removed   LUMBAR DISC SURGERY  1998   POLYPECTOMY N/A 07/22/2019   Procedure: POLYPECTOMY;  Surgeon: Midge Minium, MD;  Location: [facility];  Service: Endoscopy;  Laterality: N/A;    Social History   Tobacco Use   Smoking status: Never   Smokeless tobacco: Never  Vaping Use   Vaping status: Never Used  Substance Use Topics   Alcohol use: No   Drug use: No     Medication list has been reviewed and updated.  Current Meds  Medication Sig   acetaminophen (TYLENOL) 500 MG tablet Take 2 tablets (1,000 mg total) by mouth 2 (two) times daily.   amLODipine (NORVASC) 5 MG tablet Take 1 tablet by mouth once daily   Bisacodyl (LAXATIVE PO) Take by mouth. Generic OTC PRN   buPROPion (WELLBUTRIN XL) 300 MG 24 hr tablet Take 1 tablet by mouth once daily   CALCIUM CITRATE PO Take 1,200 mg by mouth daily.   cholecalciferol (VITAMIN D3) 25 MCG (1000 UNIT) tablet Take 1,000 Units by mouth daily.   ezetimibe (ZETIA) 10 MG tablet Take 1 tablet by mouth once daily   famotidine (PEPCID) 40 MG tablet Take 1 tablet (40 mg total) by mouth at bedtime.   glucose blood (ONETOUCH ULTRA) test strip Test Blood Sugar twice daily.   irbesartan (AVAPRO) 300 MG tablet Take 1 tablet by mouth once daily   meloxicam (MOBIC) 15 MG tablet Take 1 tablet by mouth once daily   Multiple Vitamins-Minerals (MULTIVITAMIN ADULTS 50+ PO) Take 1 tablet by mouth daily.   mupirocin ointment (BACTROBAN) 2 % Apply 1 Application topically 2 (two) times daily.   NON FORMULARY CPAP @@ 12 cm H20   nystatin cream (MYCOSTATIN) Apply to affected area 2 times daily   Omega-3 Fatty Acids (FISH OIL) 1000 MG CAPS Take 1 capsule by mouth daily.   OneTouch Delica Lancets 33G MISC Inject 1 each into the skin daily.   REFRESH 1.4-0.6 %  SOLN Place 1 drop into both eyes as needed.       07/14/2023    4:05 PM 05/05/2023   11:28 AM 03/13/2023   10:55 AM 02/03/2023    3:19 PM  GAD 7 : Generalized Anxiety Score  Nervous, Anxious, on Edge 1 0 0 0  Control/stop worrying 0 0 0 0  Worry too much - different things 0 0 0 0  Trouble relaxing 0 0 0 0  Restless 0 0 0 0  Easily annoyed or irritable 0 0 0 0  Afraid - awful might happen 0 0 0 0  Total GAD 7 Score 1 0 0 0  Anxiety Difficulty Not difficult at all Not difficult at all Not difficult at all Not difficult at all       07/14/2023    4:05 PM  07/05/2023    2:17 PM 05/05/2023   11:27 AM  Depression screen PHQ 2/9  Decreased Interest 0 0 0  Down, Depressed, Hopeless 0 0 0  PHQ - 2 Score 0 0 0  Altered sleeping 0 0 0  Tired, decreased energy 1 0 0  Change in appetite 0 0 0  Feeling bad or failure about yourself  0 0 0  Trouble concentrating 0 0 0  Moving slowly or fidgety/restless 0 0 0  Suicidal thoughts 0 0 0  PHQ-9 Score 1 0 0  Difficult doing work/chores Not difficult at all Not difficult at all Not difficult at all    BP Readings from Last 3 Encounters:  07/14/23 118/78  05/05/23 128/78  03/13/23 124/70    Physical Exam Vitals and nursing note reviewed.  Constitutional:      General: She is not in acute distress.    Appearance: She is well-developed.  HENT:     Head: Normocephalic and atraumatic.  Neck:     Vascular: No carotid bruit.  Cardiovascular:     Rate and Rhythm: Normal rate and regular rhythm.     Pulses: Normal pulses.  Pulmonary:     Effort: Pulmonary effort is normal. No respiratory distress.     Breath sounds: No wheezing or rhonchi.  Musculoskeletal:     Cervical back: Normal range of motion.     Right lower leg: No edema.     Left lower leg: No edema.  Lymphadenopathy:     Cervical: No cervical adenopathy.  Skin:    General: Skin is warm and dry.     Findings: No rash.  Neurological:     General: No focal deficit present.      Mental Status: She is alert and oriented to person, place, and time.     Gait: Gait normal.  Psychiatric:        Attention and Perception: Attention normal.        Mood and Affect: Mood normal.        Behavior: Behavior normal.     Wt Readings from Last 3 Encounters:  07/14/23 192 lb 8 oz (87.3 kg)  05/05/23 198 lb 3.2 oz (89.9 kg)  03/13/23 203 lb 9.6 oz (92.4 kg)    BP 118/78   Pulse 86   Ht 5\' 7"  (1.702 m)   Wt 192 lb 8 oz (87.3 kg)   SpO2 95%   BMI 30.15 kg/m   Assessment and Plan:  Problem List Items Addressed This Visit       Unprioritized   Type II diabetes mellitus with complication (HCC) - Primary (Chronic)   Blood sugars stable without hypoglycemic symptoms or events. Currently managed with diet. She has eliminated meat from her diet and has lost 10 lbs.  She feels better than she has in a while. Lab Results  Component Value Date   HGBA1C 6.8 (H) 03/13/2023  A1C today = 6.4       Relevant Orders   POCT glycosylated hemoglobin (Hb A1C) (Completed)   Hyperlipidemia associated with type 2 diabetes mellitus (HCC) (Chronic)   Essential hypertension (Chronic)   Controlled BP with normal exam. Current regimen is Irbesartan and amlodipine. Will continue same medications; encourage continued reduced sodium diet.       Stage 3a chronic kidney disease (HCC) (Chronic)   Stable for the past 5 years. Will recheck labs next visit at [facility]       Return in about 4 months (around 11/13/2023)  for [facility].    Reubin Milan, MD [facility] [place]

## 2023-07-17 ENCOUNTER — Ambulatory Visit: Payer: Medicare (Managed Care) | Admitting: Internal Medicine

## 2023-08-14 ENCOUNTER — Ambulatory Visit: Payer: Medicare (Managed Care) | Admitting: Dietician

## 2023-08-18 ENCOUNTER — Ambulatory Visit (INDEPENDENT_AMBULATORY_CARE_PROVIDER_SITE_OTHER): Payer: Medicare (Managed Care) | Admitting: Internal Medicine

## 2023-08-18 ENCOUNTER — Encounter: Payer: Self-pay | Admitting: Internal Medicine

## 2023-08-18 VITALS — BP 129/78 | HR 75 | Temp 98.1°F | Ht 67.0 in | Wt 192.5 lb

## 2023-08-18 DIAGNOSIS — B379 Candidiasis, unspecified: Secondary | ICD-10-CM

## 2023-08-18 DIAGNOSIS — I10 Essential (primary) hypertension: Secondary | ICD-10-CM | POA: Diagnosis not present

## 2023-08-18 MED ORDER — FLUCONAZOLE 100 MG PO TABS: 100.0000 mg | ORAL_TABLET | ORAL | 0 refills | Status: AC

## 2023-08-18 MED ORDER — IRBESARTAN 300 MG PO TABS: 300.0000 mg | ORAL_TABLET | Freq: Every day | ORAL | 1 refills | Status: DC

## 2023-08-18 MED ORDER — NYSTATIN-TRIAMCINOLONE 100000-0.1 UNIT/GM-% EX OINT: 1.0000 | TOPICAL_OINTMENT | Freq: Two times a day (BID) | CUTANEOUS | 0 refills | Status: AC

## 2023-08-18 NOTE — Progress Notes (Signed)
 Date:  08/18/2023   Name:  Molly BETTENDORF   DOB:  1941/04/27   MRN:  [id number]   Chief Complaint: Rash (Patient said it started 2 days ago, lower part of her stomach, mildly itchy, redness )  Rash This is a new problem. The current episode started yesterday. The problem is unchanged. The affected locations include the abdomen. The rash is characterized by itchiness and redness. She was exposed to nothing. Pertinent negatives include no cough, diarrhea, fatigue or shortness of breath.  Hypertension This is a chronic problem. The problem is controlled. Pertinent negatives include no chest pain, headaches, palpitations or shortness of breath.    Review of Systems  Constitutional:  Negative for fatigue and unexpected weight change.  HENT:  Negative for trouble swallowing.   Eyes:  Negative for visual disturbance.  Respiratory:  Negative for cough, chest tightness, shortness of breath and wheezing.   Cardiovascular:  Negative for chest pain, palpitations and leg swelling.  Gastrointestinal:  Negative for abdominal pain, constipation and diarrhea.  Musculoskeletal:  Negative for arthralgias and myalgias.  Skin:  Positive for rash.  Neurological:  Negative for dizziness, weakness, light-headedness and headaches.     Lab Results  Component Value Date   NA 141 03/13/2023   K 4.4 03/13/2023   CO2 21 03/13/2023   GLUCOSE 147 (H) 03/13/2023   BUN 15 03/13/2023   CREATININE 0.95 03/13/2023   CALCIUM 10.2 03/13/2023   EGFR 60 03/13/2023   GFRNONAA 55 (L) 11/25/2019   Lab Results  Component Value Date   CHOL 189 11/08/2022   HDL 68 11/08/2022   LDLCALC 95 11/08/2022   TRIG 155 (H) 11/08/2022   CHOLHDL 2.8 11/08/2022   Lab Results  Component Value Date   TSH 1.420 11/08/2022   Lab Results  Component Value Date   HGBA1C 6.4 (A) 07/14/2023   Lab Results  Component Value Date   WBC 4.4 11/08/2022   HGB 13.0 11/08/2022   HCT 39.1 11/08/2022   MCV 89 11/08/2022   PLT 216  11/08/2022   Lab Results  Component Value Date   ALT 13 11/08/2022   AST 21 11/08/2022   ALKPHOS 123 (H) 11/08/2022   BILITOT 0.4 11/08/2022   Lab Results  Component Value Date   VD25OH 37.3 11/02/2021     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 03/09/2022   Stage 3a chronic kidney disease (HCC) 11/29/2021   Acute pain of right shoulder 09/24/2020   Statin myopathy 03/31/2020   Gastroesophageal reflux disease 09/19/2019   Special screening for malignant neoplasms, colon    Tubular adenoma of colon    Anxiety disorder 06/13/2019   Osteopenia determined by x-ray 06/06/2018   Essential hypertension 04/26/2018   Eczema 10/25/2017   Slow transit constipation 02/06/2017   Primary osteoarthritis of left hip 01/12/2017   OSA on CPAP 01/02/2017   Type II diabetes mellitus with complication (HCC) 06/24/2016   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   Hyperlipidemia associated with type 2 diabetes mellitus (HCC) 06/24/2016   Obesity (BMI 30.0-34.9) 06/24/2016   Vitamin D deficiency 06/24/2016   History of shingles 06/24/2016   Allergic rhinitis 06/24/2016    Allergies  Allergen Reactions   Atorvastatin Other (See Comments)    Peripheral neuropathy and myalgia   Influenza Vaccines Rash    Past Surgical History:  Procedure Laterality Date   ABDOMINAL HYSTERECTOMY  BILATERAL CARPAL TUNNEL RELEASE     CATARACT EXTRACTION W/PHACO Right 11/12/2018   Procedure: CATARACT EXTRACTION PHACO AND INTRAOCULAR LENS PLACEMENT (IOC)  RIGHT DIABETIC;  Surgeon: Nevada Crane, MD;  Location: [facility];  Service: Ophthalmology;  Laterality: Right;  Diabetic - diet controlled sleep apnea   CERVICAL FUSION  2004   COLONOSCOPY WITH PROPOFOL N/A 07/22/2019   Procedure: COLONOSCOPY WITH BIOPSIES;  Surgeon: Midge Minium, MD;  Location: [facility];  Service: Endoscopy;  Laterality: N/A;  Diabetic -  diet controlled priority 4   FOOT SURGERY     HAND SURGERY Left 06/2022   palmar cyst removed   LUMBAR DISC SURGERY  1998   POLYPECTOMY N/A 07/22/2019   Procedure: POLYPECTOMY;  Surgeon: Midge Minium, MD;  Location: [facility];  Service: Endoscopy;  Laterality: N/A;    Social History   Tobacco Use   Smoking status: Never   Smokeless tobacco: Never  Vaping Use   Vaping status: Never Used  Substance Use Topics   Alcohol use: No   Drug use: No     Medication list has been reviewed and updated.  Current Meds  Medication Sig   acetaminophen (TYLENOL) 500 MG tablet Take 2 tablets (1,000 mg total) by mouth 2 (two) times daily.   amLODipine (NORVASC) 5 MG tablet Take 1 tablet by mouth once daily   Bisacodyl (LAXATIVE PO) Take by mouth. Generic OTC PRN   buPROPion (WELLBUTRIN XL) 300 MG 24 hr tablet Take 1 tablet by mouth once daily   CALCIUM CITRATE PO Take 1,200 mg by mouth daily.   cholecalciferol (VITAMIN D3) 25 MCG (1000 UNIT) tablet Take 1,000 Units by mouth daily.   ezetimibe (ZETIA) 10 MG tablet Take 1 tablet by mouth once daily   famotidine (PEPCID) 40 MG tablet Take 1 tablet (40 mg total) by mouth at bedtime.   fluconazole (DIFLUCAN) 100 MG tablet Take 1 tablet (100 mg total) by mouth every other day for 5 days.   glucose blood (ONETOUCH ULTRA) test strip Test Blood Sugar twice daily.   meloxicam (MOBIC) 15 MG tablet Take 1 tablet by mouth once daily   Multiple Vitamins-Minerals (MULTIVITAMIN ADULTS 50+ PO) Take 1 tablet by mouth daily.   mupirocin ointment (BACTROBAN) 2 % Apply 1 Application topically 2 (two) times daily.   NON FORMULARY CPAP @@ 12 cm H20   nystatin cream (MYCOSTATIN) Apply to affected area 2 times daily   nystatin-triamcinolone ointment (MYCOLOG) Apply 1 Application topically 2 (two) times daily.   Omega-3 Fatty Acids (FISH OIL) 1000 MG CAPS Take 1 capsule by mouth daily.   OneTouch Delica Lancets 33G MISC Inject 1 each into the skin daily.    REFRESH 1.4-0.6 % SOLN Place 1 drop into both eyes as needed.   [DISCONTINUED] irbesartan (AVAPRO) 300 MG tablet Take 1 tablet by mouth once daily       07/14/2023    4:05 PM 05/05/2023   11:28 AM 03/13/2023   10:55 AM 02/03/2023    3:19 PM  GAD 7 : Generalized Anxiety Score  Nervous, Anxious, on Edge 1 0 0 0  Control/stop worrying 0 0 0 0  Worry too much - different things 0 0 0 0  Trouble relaxing 0 0 0 0  Restless 0 0 0 0  Easily annoyed or irritable 0 0 0 0  Afraid - awful might happen 0 0 0 0  Total GAD 7 Score 1 0 0 0  Anxiety Difficulty Not difficult  at all Not difficult at all Not difficult at all Not difficult at all       07/14/2023    4:05 PM 07/05/2023    2:17 PM 05/05/2023   11:27 AM  Depression screen PHQ 2/9  Decreased Interest 0 0 0  Down, Depressed, Hopeless 0 0 0  PHQ - 2 Score 0 0 0  Altered sleeping 0 0 0  Tired, decreased energy 1 0 0  Change in appetite 0 0 0  Feeling bad or failure about yourself  0 0 0  Trouble concentrating 0 0 0  Moving slowly or fidgety/restless 0 0 0  Suicidal thoughts 0 0 0  PHQ-9 Score 1 0 0  Difficult doing work/chores Not difficult at all Not difficult at all Not difficult at all    BP Readings from Last 3 Encounters:  08/18/23 129/78  07/14/23 118/78  05/05/23 128/78    Physical Exam Vitals and nursing note reviewed.  Constitutional:      General: She is not in acute distress.    Appearance: She is well-developed.  HENT:     Head: Normocephalic and atraumatic.  Cardiovascular:     Rate and Rhythm: Normal rate and regular rhythm.  Pulmonary:     Effort: Pulmonary effort is normal. No respiratory distress.  Skin:    General: Skin is warm and dry.     Findings: Erythema and rash present.          Comments: Candida type rash  Neurological:     Mental Status: She is alert and oriented to person, place, and time.  Psychiatric:        Mood and Affect: Mood normal.        Behavior: Behavior normal.     Wt  Readings from Last 3 Encounters:  08/18/23 192 lb 8 oz (87.3 kg)  07/14/23 192 lb 8 oz (87.3 kg)  05/05/23 198 lb 3.2 oz (89.9 kg)    BP 129/78   Pulse 75   Temp 98.1 F (36.7 C)   Ht 5\' 7"  (1.702 m)   Wt 192 lb 8 oz (87.3 kg)   SpO2 96%   BMI 30.15 kg/m   Assessment and Plan:  Problem List Items Addressed This Visit       Unprioritized   Essential hypertension (Chronic)   Blood pressure is well controlled.  Current medications amlodipine and irbesartan. Intermittent mild ankle edema noted at the end of the day. Will continue same regimen along with efforts to limit dietary sodium and increase water intake.       Relevant Medications   irbesartan (AVAPRO) 300 MG tablet   Other Visit Diagnoses       Candida infection    -  Primary   Relevant Medications   nystatin-triamcinolone ointment (MYCOLOG)   fluconazole (DIFLUCAN) 100 MG tablet       No follow-ups on file.    Reubin Milan, MD [facility] [place]

## 2023-08-18 NOTE — Assessment & Plan Note (Addendum)
 Blood pressure is well controlled.  Current medications amlodipine and irbesartan. Intermittent mild ankle edema noted at the end of the day. Will continue same regimen along with efforts to limit dietary sodium and increase water intake.

## 2023-08-21 ENCOUNTER — Ambulatory Visit
Admission: RE | Admit: 2023-08-21 | Discharge: 2023-08-21 | Disposition: A | Discharge status: Home / Self Care | Payer: Medicare (Managed Care) | Source: Ambulatory Visit | Attending: Internal Medicine | Admitting: Internal Medicine

## 2023-08-21 ENCOUNTER — Ambulatory Visit: Admission: RE | Admit: 2023-08-21 | Payer: Medicare (Managed Care) | Source: Home / Self Care

## 2023-08-21 ENCOUNTER — Ambulatory Visit (INDEPENDENT_AMBULATORY_CARE_PROVIDER_SITE_OTHER): Payer: Medicare (Managed Care) | Admitting: Internal Medicine

## 2023-08-21 ENCOUNTER — Encounter: Payer: Self-pay | Admitting: Internal Medicine

## 2023-08-21 ENCOUNTER — Ambulatory Visit: Payer: Self-pay

## 2023-08-21 ENCOUNTER — Ambulatory Visit: Payer: Medicare (Managed Care) | Admitting: Internal Medicine

## 2023-08-21 VITALS — BP 138/82 | HR 79 | Ht 67.0 in

## 2023-08-21 DIAGNOSIS — M5441 Lumbago with sciatica, right side: Secondary | ICD-10-CM

## 2023-08-21 MED ORDER — BACLOFEN 10 MG PO TABS: 10.0000 mg | ORAL_TABLET | Freq: Three times a day (TID) | ORAL | 0 refills | Status: DC

## 2023-08-21 MED ORDER — TRAMADOL HCL 50 MG PO TABS: 50.0000 mg | ORAL_TABLET | Freq: Three times a day (TID) | ORAL | 0 refills | Status: DC | PRN

## 2023-08-21 MED ORDER — PREDNISONE 10 MG PO TABS: ORAL_TABLET | ORAL | 0 refills | Status: AC

## 2023-08-21 NOTE — Progress Notes (Signed)
 Date:  08/21/2023   Name:  Molly Ruiz   DOB:  October 08, 1940   MRN:  [id number]   Chief Complaint: Back Pain (Patient said sharp pain radiates down her back leg down to her feet, since December pain comes and go)  Back Pain This is a chronic problem. Episode onset: since December. The problem has been waxing and waning since onset. The pain is present in the lumbar spine. The quality of the pain is described as aching. The pain radiates to the right foot. The pain is moderate. Pertinent negatives include no abdominal pain, bladder incontinence, bowel incontinence, fever or numbness.  She has hx of back problems - surgery in 1998.  This feels similar just on the right side.  No fall or injury.  Worsening over the past few days but started bothering her more in December.  Has aching down the lateral right thigh and pain in the right lower back.  No weakness or numbness, no loss of bowel or bladder control.   Review of Systems  Constitutional:  Negative for chills, fatigue and fever.  Respiratory:  Negative for chest tightness and shortness of breath.   Gastrointestinal:  Negative for abdominal pain, bowel incontinence, constipation, diarrhea and vomiting.  Genitourinary:  Negative for bladder incontinence and difficulty urinating.  Musculoskeletal:  Positive for arthralgias, back pain and gait problem. Negative for joint swelling.  Neurological:  Negative for numbness.  Psychiatric/Behavioral:  Positive for sleep disturbance. Negative for dysphoric mood. The patient is not nervous/anxious.      Lab Results  Component Value Date   NA 141 03/13/2023   K 4.4 03/13/2023   CO2 21 03/13/2023   GLUCOSE 147 (H) 03/13/2023   BUN 15 03/13/2023   CREATININE 0.95 03/13/2023   CALCIUM 10.2 03/13/2023   EGFR 60 03/13/2023   GFRNONAA 55 (L) 11/25/2019   Lab Results  Component Value Date   CHOL 189 11/08/2022   HDL 68 11/08/2022   LDLCALC 95 11/08/2022   TRIG 155 (H) 11/08/2022   CHOLHDL  2.8 11/08/2022   Lab Results  Component Value Date   TSH 1.420 11/08/2022   Lab Results  Component Value Date   HGBA1C 6.4 (A) 07/14/2023   Lab Results  Component Value Date   WBC 4.4 11/08/2022   HGB 13.0 11/08/2022   HCT 39.1 11/08/2022   MCV 89 11/08/2022   PLT 216 11/08/2022   Lab Results  Component Value Date   ALT 13 11/08/2022   AST 21 11/08/2022   ALKPHOS 123 (H) 11/08/2022   BILITOT 0.4 11/08/2022   Lab Results  Component Value Date   VD25OH 37.3 11/02/2021     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 03/09/2022   Stage 3a chronic kidney disease (HCC) 11/29/2021   Acute pain of right shoulder 09/24/2020   Statin myopathy 03/31/2020   Gastroesophageal reflux disease 09/19/2019   Special screening for malignant neoplasms, colon    Tubular adenoma of colon    Anxiety disorder 06/13/2019   Osteopenia determined by x-ray 06/06/2018   Essential hypertension 04/26/2018   Eczema 10/25/2017   Slow transit constipation 02/06/2017   Primary osteoarthritis of left hip 01/12/2017   OSA on CPAP 01/02/2017   Type II diabetes mellitus with complication (HCC) 06/24/2016   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   Hyperlipidemia associated with type 2 diabetes mellitus (HCC) 06/24/2016   Obesity (BMI 30.0-34.9)  06/24/2016   Vitamin D deficiency 06/24/2016   History of shingles 06/24/2016   Allergic rhinitis 06/24/2016    Allergies  Allergen Reactions   Atorvastatin Other (See Comments)    Peripheral neuropathy and myalgia   Influenza Vaccines Rash    Past Surgical History:  Procedure Laterality Date   ABDOMINAL HYSTERECTOMY     BILATERAL CARPAL TUNNEL RELEASE     CATARACT EXTRACTION W/PHACO Right 11/12/2018   Procedure: CATARACT EXTRACTION PHACO AND INTRAOCULAR LENS PLACEMENT (IOC)  RIGHT DIABETIC;  Surgeon: Rosa College, MD;  Location: [facility];  Service:  Ophthalmology;  Laterality: Right;  Diabetic - diet controlled sleep apnea   CERVICAL FUSION  2004   COLONOSCOPY WITH PROPOFOL N/A 07/22/2019   Procedure: COLONOSCOPY WITH BIOPSIES;  Surgeon: Marnee Sink, MD;  Location: [facility];  Service: Endoscopy;  Laterality: N/A;  Diabetic - diet controlled priority 4   FOOT SURGERY     HAND SURGERY Left 06/2022   palmar cyst removed   LUMBAR DISC SURGERY  1998   POLYPECTOMY N/A 07/22/2019   Procedure: POLYPECTOMY;  Surgeon: Marnee Sink, MD;  Location: [facility];  Service: Endoscopy;  Laterality: N/A;    Social History   Tobacco Use   Smoking status: Never   Smokeless tobacco: Never  Vaping Use   Vaping status: Never Used  Substance Use Topics   Alcohol use: No   Drug use: No     Medication list has been reviewed and updated.  Current Meds  Medication Sig   acetaminophen (TYLENOL) 500 MG tablet Take 2 tablets (1,000 mg total) by mouth 2 (two) times daily.   amLODipine (NORVASC) 5 MG tablet Take 1 tablet by mouth once daily   baclofen (LIORESAL) 10 MG tablet Take 1 tablet (10 mg total) by mouth 3 (three) times daily.   Bisacodyl (LAXATIVE PO) Take by mouth. Generic OTC PRN   buPROPion (WELLBUTRIN XL) 300 MG 24 hr tablet Take 1 tablet by mouth once daily   CALCIUM CITRATE PO Take 1,200 mg by mouth daily.   cholecalciferol (VITAMIN D3) 25 MCG (1000 UNIT) tablet Take 1,000 Units by mouth daily.   ezetimibe (ZETIA) 10 MG tablet Take 1 tablet by mouth once daily   famotidine (PEPCID) 40 MG tablet Take 1 tablet (40 mg total) by mouth at bedtime.   fluconazole (DIFLUCAN) 100 MG tablet Take 1 tablet (100 mg total) by mouth every other day for 5 days.   glucose blood (ONETOUCH ULTRA) test strip Test Blood Sugar twice daily.   irbesartan (AVAPRO) 300 MG tablet Take 1 tablet (300 mg total) by mouth daily.   meloxicam (MOBIC) 15 MG tablet Take 1 tablet by mouth once daily   Multiple Vitamins-Minerals (MULTIVITAMIN ADULTS 50+  PO) Take 1 tablet by mouth daily.   mupirocin ointment (BACTROBAN) 2 % Apply 1 Application topically 2 (two) times daily.   NON FORMULARY CPAP @@ 12 cm H20   nystatin cream (MYCOSTATIN) Apply to affected area 2 times daily   nystatin-triamcinolone ointment (MYCOLOG) Apply 1 Application topically 2 (two) times daily.   Omega-3 Fatty Acids (FISH OIL) 1000 MG CAPS Take 1 capsule by mouth daily.   OneTouch Delica Lancets 33G MISC Inject 1 each into the skin daily.   predniSONE (DELTASONE) 10 MG tablet Take 6 tablets (60 mg total) by mouth daily with breakfast for 2 days, THEN 5 tablets (50 mg total) daily with breakfast for 2 days, THEN 4 tablets (40 mg total) daily with  breakfast for 2 days, THEN 3 tablets (30 mg total) daily with breakfast for 2 days, THEN 2 tablets (20 mg total) daily with breakfast for 2 days, THEN 1 tablet (10 mg total) daily with breakfast for 2 days.   REFRESH 1.4-0.6 % SOLN Place 1 drop into both eyes as needed.   traMADol (ULTRAM) 50 MG tablet Take 1 tablet (50 mg total) by mouth every 8 (eight) hours as needed for up to 5 days for severe pain (pain score 7-10).       08/21/2023    3:15 PM 07/14/2023    4:05 PM 05/05/2023   11:28 AM 03/13/2023   10:55 AM  GAD 7 : Generalized Anxiety Score  Nervous, Anxious, on Edge 1 1 0 0  Control/stop worrying 0 0 0 0  Worry too much - different things 0 0 0 0  Trouble relaxing  0 0 0  Restless 0 0 0 0  Easily annoyed or irritable 0 0 0 0  Afraid - awful might happen 0 0 0 0  Total GAD 7 Score  1 0 0  Anxiety Difficulty Not difficult at all Not difficult at all Not difficult at all Not difficult at all       08/21/2023    3:15 PM 07/14/2023    4:05 PM 07/05/2023    2:17 PM  Depression screen PHQ 2/9  Decreased Interest 0 0 0  Down, Depressed, Hopeless 0 0 0  PHQ - 2 Score 0 0 0  Altered sleeping 0 0 0  Tired, decreased energy 1 1 0  Change in appetite 0 0 0  Feeling bad or failure about yourself  0 0 0  Trouble concentrating  0 0 0  Moving slowly or fidgety/restless 0 0 0  Suicidal thoughts 0 0 0  PHQ-9 Score 1 1 0  Difficult doing work/chores Not difficult at all Not difficult at all Not difficult at all    BP Readings from Last 3 Encounters:  08/21/23 138/82  08/18/23 129/78  07/14/23 118/78    Physical Exam Vitals and nursing note reviewed.  Constitutional:      General: She is not in acute distress.    Appearance: Normal appearance. She is well-developed.  HENT:     Head: Normocephalic and atraumatic.  Cardiovascular:     Rate and Rhythm: Normal rate and regular rhythm.  Pulmonary:     Effort: Pulmonary effort is normal. No respiratory distress.     Breath sounds: No wheezing or rhonchi.  Abdominal:     General: Abdomen is flat.     Palpations: Abdomen is soft.  Musculoskeletal:     Lumbar back: Spasms and tenderness present. Decreased range of motion. Positive right straight leg raise test. Negative left straight leg raise test.     Right knee: Normal.     Left knee: Normal.  Skin:    General: Skin is warm and dry.     Findings: No rash.  Neurological:     Mental Status: She is alert and oriented to person, place, and time.     Sensory: Sensation is intact.     Motor: Motor function is intact.     Gait: Gait abnormal (pain with ambulation).     Deep Tendon Reflexes:     Reflex Scores:      Patellar reflexes are 1+ on the right side and 1+ on the left side.      Achilles reflexes are 0 on the right side and  0 on the left side. Psychiatric:        Mood and Affect: Mood normal.        Behavior: Behavior normal.     Wt Readings from Last 3 Encounters:  08/18/23 192 lb 8 oz (87.3 kg)  07/14/23 192 lb 8 oz (87.3 kg)  05/05/23 198 lb 3.2 oz (89.9 kg)    BP 138/82   Pulse 79   Ht 5\' 7"  (1.702 m)   SpO2 98%   BMI 30.15 kg/m   Assessment and Plan:  Problem List Items Addressed This Visit   None Visit Diagnoses       Acute right-sided low back pain with right-sided sciatica     -  Primary   will treat with baclofen, prednisone taper and tramadol for severe pain will get imaging - consider MRI if worsening   Relevant Medications   baclofen (LIORESAL) 10 MG tablet   predniSONE (DELTASONE) 10 MG tablet   traMADol (ULTRAM) 50 MG tablet   Other Relevant Orders   DG Lumbar Spine Complete       No follow-ups on file.    Sheron Dixons, MD [facility] [place]

## 2023-08-21 NOTE — Telephone Encounter (Signed)
 Noted  Pt has a appt.  KP

## 2023-08-21 NOTE — Telephone Encounter (Signed)
  Chief Complaint: right leg pain Symptoms: severe lower back/right hip and leg pain Frequency: chronic, worsening x 2 days Pertinent Negatives: Patient denies swelling, rash, fever, chest pain, SOB. Disposition: [] ED /[] Urgent Care (no appt availability in office) / [x] Appointment(In office/virtual)/ []  [facility]/ [] Home Care/ [] Refused Recommended Disposition /[] [place] [facility]/ []  Follow-up with PCP Additional Notes: Patient states for a few months she has had intermittent lower back and right hip pain that radiates down her right leg. Patient states for the past 2 days it has worsened to the point it is almost too severe to walk due to pain. Patient states the Tylenol was not helping her pain and denies taking any OTC medications today for pain. Patient agreeable to acute visit this AM with PCP Gala Jubilee.  Copied from CRM [id number]. Topic: Clinical - Red Word Triage >> Aug 21, 2023  8:23 AM Baldemar Lev wrote: Red Word that prompted transfer to Nurse Triage: Severe pain in right leg. Can hardly walk. Pain from hip down to ankle. Reason for Disposition  [1] MODERATE pain (e.g., interferes with normal activities, limping) AND [2] present > 3 days  Answer Assessment - Initial Assessment Questions 1. ONSET: "When did the pain start?"      On and off for a few months. Patient seen 05/05/23 acute visit for the pain. Worsened x 2 days.  2. LOCATION: "Where is the pain located?"      Right leg: hip down to ankle.  3. PAIN: "How bad is the pain?"    (Scale 1-10; or mild, moderate, severe)   -  MILD (1-3): doesn't interfere with normal activities    -  MODERATE (4-7): interferes with normal activities (e.g., work or school) or awakens from sleep, limping    -  SEVERE (8-10): excruciating pain, unable to do any normal activities, unable to walk     Severe. No OTC pain medication taken today.  4. WORK OR EXERCISE: "Has there been any recent work or exercise that involved this part  of the body?"      Denies.  5. CAUSE: "What do you think is causing the leg pain?"     Patient seen in the past for sciatica.  6. OTHER SYMPTOMS: "Do you have any other symptoms?" (e.g., chest pain, back pain, breathing difficulty, swelling, rash, fever, numbness, weakness)     Center low back pain, numbness yesterday in right leg.  7. PREGNANCY: "Is there any chance you are pregnant?" "When was your last menstrual period?"     N/A.  Protocols used: Leg Pain-A-AH

## 2023-08-22 ENCOUNTER — Ambulatory Visit
Admission: RE | Admit: 2023-08-22 | Discharge: 2023-08-22 | Disposition: A | Discharge status: Home / Self Care | Payer: Medicare (Managed Care) | Attending: Internal Medicine | Admitting: Internal Medicine

## 2023-08-22 ENCOUNTER — Ambulatory Visit
Admission: RE | Admit: 2023-08-22 | Discharge: 2023-08-22 | Disposition: A | Discharge status: Home / Self Care | Payer: Medicare (Managed Care) | Source: Ambulatory Visit | Attending: Internal Medicine | Admitting: Internal Medicine

## 2023-08-22 ENCOUNTER — Encounter: Payer: Self-pay | Admitting: Internal Medicine

## 2023-08-25 ENCOUNTER — Ambulatory Visit (INDEPENDENT_AMBULATORY_CARE_PROVIDER_SITE_OTHER): Payer: Medicare (Managed Care) | Admitting: Internal Medicine

## 2023-08-25 ENCOUNTER — Encounter: Payer: Self-pay | Admitting: Internal Medicine

## 2023-08-25 VITALS — BP 138/76 | HR 79 | Ht 67.0 in | Wt 187.3 lb

## 2023-08-25 DIAGNOSIS — M5441 Lumbago with sciatica, right side: Secondary | ICD-10-CM | POA: Diagnosis not present

## 2023-08-25 MED ORDER — GABAPENTIN 100 MG PO CAPS
100.0000 mg | ORAL_CAPSULE | Freq: Two times a day (BID) | ORAL | 0 refills | Status: DC
Start: 2023-08-25 — End: 2023-09-11

## 2023-08-25 NOTE — Assessment & Plan Note (Addendum)
 She has not had any benefit from steroid taper or baclofen . The tramadol  at night has not been helpful for pain - in fact sleeping is more difficult. She is walking at home with crutches - in wheelchair today  Will stop Tramadol ; start gabapentin  100 mg at bedtime and 100 mg qAM if tolerated; can increase to 200 mg at bedtime Will get MRI - hopefully will be approved without PTx Rx for Walker given

## 2023-08-25 NOTE — Progress Notes (Signed)
 Date:  08/25/2023   Name:  Molly Ruiz   DOB:  20-Apr-1941   MRN:  [id number]   Chief Complaint: Leg Pain (Feels like nothing is working, tramadol  not working nor prednisone  )  Back Pain This is a recurrent problem. The problem occurs constantly. The pain is present in the lumbar spine. The quality of the pain is described as burning and aching. The pain radiates to the right foot. The pain is moderate. Pertinent negatives include no abdominal pain, chest pain or fever. She has tried NSAIDs, analgesics and muscle relaxant (and steroid taper) for the symptoms.    Review of Systems  Constitutional:  Negative for appetite change and fever.  Respiratory:  Negative for chest tightness and shortness of breath.   Cardiovascular:  Negative for chest pain.  Gastrointestinal:  Negative for abdominal pain.  Genitourinary:  Negative for difficulty urinating.  Musculoskeletal:  Positive for back pain, gait problem and myalgias.  Psychiatric/Behavioral:  Positive for sleep disturbance.      Lab Results  Component Value Date   NA 141 03/13/2023   K 4.4 03/13/2023   CO2 21 03/13/2023   GLUCOSE 147 (H) 03/13/2023   BUN 15 03/13/2023   CREATININE 0.95 03/13/2023   CALCIUM  10.2 03/13/2023   EGFR 60 03/13/2023   GFRNONAA 55 (L) 11/25/2019   Lab Results  Component Value Date   CHOL 189 11/08/2022   HDL 68 11/08/2022   LDLCALC 95 11/08/2022   TRIG 155 (H) 11/08/2022   CHOLHDL 2.8 11/08/2022   Lab Results  Component Value Date   TSH 1.420 11/08/2022   Lab Results  Component Value Date   HGBA1C 6.4 (A) 07/14/2023   Lab Results  Component Value Date   WBC 4.4 11/08/2022   HGB 13.0 11/08/2022   HCT 39.1 11/08/2022   MCV 89 11/08/2022   PLT 216 11/08/2022   Lab Results  Component Value Date   ALT 13 11/08/2022   AST 21 11/08/2022   ALKPHOS 123 (H) 11/08/2022   BILITOT 0.4 11/08/2022   Lab Results  Component Value Date   VD25OH 37.3 11/02/2021     Patient Active Problem  List   Diagnosis Date Noted   Acute right-sided low back pain with right-sided sciatica 08/25/2023   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 03/09/2022   Stage 3a chronic kidney disease (HCC) 11/29/2021   Acute pain of right shoulder 09/24/2020   Statin myopathy 03/31/2020   Gastroesophageal reflux disease 09/19/2019   Special screening for malignant neoplasms, colon    Tubular adenoma of colon    Anxiety disorder 06/13/2019   Osteopenia determined by x-ray 06/06/2018   Essential hypertension 04/26/2018   Eczema 10/25/2017   Slow transit constipation 02/06/2017   Primary osteoarthritis of left hip 01/12/2017   OSA on CPAP 01/02/2017   Type II diabetes mellitus with complication (HCC) 06/24/2016   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   Hyperlipidemia associated with type 2 diabetes mellitus (HCC) 06/24/2016   Obesity (BMI 30.0-34.9) 06/24/2016   Vitamin D  deficiency 06/24/2016   History of shingles 06/24/2016   Allergic rhinitis 06/24/2016    Allergies  Allergen Reactions   Atorvastatin  Other (See Comments)    Peripheral neuropathy and myalgia   Influenza Vaccines Rash    Past Surgical History:  Procedure Laterality Date   ABDOMINAL HYSTERECTOMY     BILATERAL CARPAL TUNNEL RELEASE     CATARACT EXTRACTION W/PHACO Right 11/12/2018   Procedure: CATARACT EXTRACTION  PHACO AND INTRAOCULAR LENS PLACEMENT (IOC)  RIGHT DIABETIC;  Surgeon: Rosa College, MD;  Location: [facility];  Service: Ophthalmology;  Laterality: Right;  Diabetic - diet controlled sleep apnea   CERVICAL FUSION  2004   COLONOSCOPY WITH PROPOFOL  N/A 07/22/2019   Procedure: COLONOSCOPY WITH BIOPSIES;  Surgeon: Marnee Sink, MD;  Location: [facility];  Service: Endoscopy;  Laterality: N/A;  Diabetic - diet controlled priority 4   FOOT SURGERY     HAND SURGERY Left 06/2022   palmar cyst removed   LUMBAR DISC SURGERY  1998   POLYPECTOMY N/A 07/22/2019   Procedure:  POLYPECTOMY;  Surgeon: Marnee Sink, MD;  Location: [facility];  Service: Endoscopy;  Laterality: N/A;    Social History   Tobacco Use   Smoking status: Never   Smokeless tobacco: Never  Vaping Use   Vaping status: Never Used  Substance Use Topics   Alcohol use: No   Drug use: No     Medication list has been reviewed and updated.  Current Meds  Medication Sig   acetaminophen  (TYLENOL ) 500 MG tablet Take 2 tablets (1,000 mg total) by mouth 2 (two) times daily.   amLODipine  (NORVASC ) 5 MG tablet Take 1 tablet by mouth once daily   baclofen  (LIORESAL ) 10 MG tablet Take 1 tablet (10 mg total) by mouth 3 (three) times daily.   Bisacodyl (LAXATIVE PO) Take by mouth. Generic OTC PRN   buPROPion  (WELLBUTRIN  XL) 300 MG 24 hr tablet Take 1 tablet by mouth once daily   CALCIUM  CITRATE PO Take 1,200 mg by mouth daily.   cholecalciferol (VITAMIN D3) 25 MCG (1000 UNIT) tablet Take 1,000 Units by mouth daily.   ezetimibe  (ZETIA ) 10 MG tablet Take 1 tablet by mouth once daily   famotidine  (PEPCID ) 40 MG tablet Take 1 tablet (40 mg total) by mouth at bedtime.   gabapentin  (NEURONTIN ) 100 MG capsule Take 1 capsule (100 mg total) by mouth 2 (two) times daily.   glucose blood (ONETOUCH ULTRA) test strip Test Blood Sugar twice daily.   irbesartan  (AVAPRO ) 300 MG tablet Take 1 tablet (300 mg total) by mouth daily.   meloxicam  (MOBIC ) 15 MG tablet Take 1 tablet by mouth once daily   Multiple Vitamins-Minerals (MULTIVITAMIN ADULTS 50+ PO) Take 1 tablet by mouth daily.   mupirocin  ointment (BACTROBAN ) 2 % Apply 1 Application topically 2 (two) times daily.   NON FORMULARY CPAP @@ 12 cm H20   nystatin  cream (MYCOSTATIN ) Apply to affected area 2 times daily   nystatin -triamcinolone  ointment (MYCOLOG) Apply 1 Application topically 2 (two) times daily.   Omega-3 Fatty Acids (FISH OIL) 1000 MG CAPS Take 1 capsule by mouth daily.   OneTouch Delica Lancets 33G MISC Inject 1 each into the skin daily.    predniSONE  (DELTASONE ) 10 MG tablet Take 6 tablets (60 mg total) by mouth daily with breakfast for 2 days, THEN 5 tablets (50 mg total) daily with breakfast for 2 days, THEN 4 tablets (40 mg total) daily with breakfast for 2 days, THEN 3 tablets (30 mg total) daily with breakfast for 2 days, THEN 2 tablets (20 mg total) daily with breakfast for 2 days, THEN 1 tablet (10 mg total) daily with breakfast for 2 days.   REFRESH 1.4-0.6 % SOLN Place 1 drop into both eyes as needed.   traMADol  (ULTRAM ) 50 MG tablet Take 1 tablet (50 mg total) by mouth every 8 (eight) hours as needed for up to 5 days for  severe pain (pain score 7-10).       08/25/2023    1:47 PM 08/21/2023    3:15 PM 07/14/2023    4:05 PM 05/05/2023   11:28 AM  GAD 7 : Generalized Anxiety Score  Nervous, Anxious, on Edge 0 1 1 0  Control/stop worrying 0 0 0 0  Worry too much - different things  0 0 0  Trouble relaxing   0 0  Restless  0 0 0  Easily annoyed or irritable  0 0 0  Afraid - awful might happen  0 0 0  Total GAD 7 Score   1 0  Anxiety Difficulty  Not difficult at all Not difficult at all Not difficult at all       08/25/2023    1:47 PM 08/21/2023    3:15 PM 07/14/2023    4:05 PM  Depression screen PHQ 2/9  Decreased Interest 0 0 0  Down, Depressed, Hopeless 0 0 0  PHQ - 2 Score 0 0 0  Altered sleeping  0 0  Tired, decreased energy  1 1  Change in appetite  0 0  Feeling bad or failure about yourself   0 0  Trouble concentrating  0 0  Moving slowly or fidgety/restless  0 0  Suicidal thoughts  0 0  PHQ-9 Score  1 1  Difficult doing work/chores  Not difficult at all Not difficult at all    BP Readings from Last 3 Encounters:  08/25/23 138/76  08/21/23 138/82  08/18/23 129/78    Physical Exam Cardiovascular:     Rate and Rhythm: Normal rate and regular rhythm.  Pulmonary:     Effort: Pulmonary effort is normal.     Breath sounds: Normal breath sounds.  Musculoskeletal:     Lumbar back: Tenderness  present. Positive right straight leg raise test. Negative left straight leg raise test.  Neurological:     Comments: Decreased foot flexion but normal strength Sensation intact     Wt Readings from Last 3 Encounters:  08/25/23 187 lb 5 oz (85 kg)  08/18/23 192 lb 8 oz (87.3 kg)  07/14/23 192 lb 8 oz (87.3 kg)    BP 138/76   Pulse 79   Ht 5\' 7"  (1.702 m)   Wt 187 lb 5 oz (85 kg)   SpO2 98%   BMI 29.34 kg/m   Assessment and Plan:  Problem List Items Addressed This Visit       Unprioritized   Acute right-sided low back pain with right-sided sciatica - Primary   She has not had any benefit from steroid taper or baclofen . The tramadol  at night has not been helpful for pain - in fact sleeping is more difficult. She is walking at home with crutches - in wheelchair today  Will stop Tramadol ; start gabapentin  100 mg at bedtime and 100 mg qAM if tolerated; can increase to 200 mg at bedtime Will get MRI - hopefully will be approved without PTx Rx for Walker given      Relevant Medications   gabapentin  (NEURONTIN ) 100 MG capsule   Other Relevant Orders   For home use only DME 4 wheeled rolling walker with seat ([id number])   MR LUMBAR SPINE W WO CONTRAST    No follow-ups on file.    Sheron Dixons, MD [facility] [place]

## 2023-08-25 NOTE — Patient Instructions (Signed)
 Take gabapentin  100 mg one hour before bedtime and in the AM if tolerated.  You can also increase to 2 at bedtime if needed.

## 2023-09-02 ENCOUNTER — Ambulatory Visit
Admission: RE | Admit: 2023-09-02 | Discharge: 2023-09-02 | Disposition: A | Discharge status: Home / Self Care | Payer: Medicare (Managed Care) | Source: Ambulatory Visit | Attending: Internal Medicine | Admitting: Internal Medicine

## 2023-09-02 DIAGNOSIS — M5441 Lumbago with sciatica, right side: Secondary | ICD-10-CM | POA: Diagnosis not present

## 2023-09-02 MED ORDER — GADOBUTROL 1 MMOL/ML IV SOLN
7.5000 mL | Freq: Once | INTRAVENOUS | Status: AC | PRN
  Administered 2023-09-02: 7.5 mL via INTRAVENOUS

## 2023-09-05 ENCOUNTER — Telehealth: Payer: Self-pay

## 2023-09-05 NOTE — Telephone Encounter (Signed)
 Spoke with patient and informed her and she verbalized understanding and will wait for your  call.

## 2023-09-05 NOTE — Telephone Encounter (Signed)
 Copied from CRM [id number]. Topic: Clinical - Lab/Test Results >> Sep 05, 2023 10:13 AM Jenice Mitts wrote: ,. Reason for CRM: Patient is calling because she would like a call regarding her latest MRI results

## 2023-09-06 ENCOUNTER — Ambulatory Visit: Payer: Self-pay

## 2023-09-06 NOTE — Telephone Encounter (Signed)
 Copied from CRM [id number]. Topic: Clinical - Red Word Triage >> Sep 06, 2023  1:38 PM Alysia Jumbo S wrote: [facility] that prompted transfer to Nurse Triage: b/l  leg and feet swelling   Chief Complaint: Feet/ankle swelling  Symptoms: Swelling of bilateral feet and ankles Frequency: Constant  Pertinent Negatives: Patient denies pain to swollen area, redness, fever, or shortness of breath  Disposition: [] ED /[] Urgent Care (no appt availability in office) / [x] Appointment(In office/virtual)/ []  [place] [facility]/ [] Home Care/ [] Refused Recommended Disposition /[] [place] [facility]/ []  Follow-up with PCP Additional Notes: Patient reports that this morning she began to experience bilateral foot and ankle swelling. She states that there is no pain to the swollen areas and denies any redness, fever, or shortness of breath. Patient denies any history of similar swelling. Appointment made for the patient tomorrow for evaluation of her symptoms.     Reason for Disposition  [1] MILD swelling of both ankles (i.e., pedal edema) AND [2] new-onset or worsening  Answer Assessment - Initial Assessment Questions 1. ONSET: "When did the swelling start?" (e.g., minutes, hours, days)     This morning  2. LOCATION: "What part of the leg is swollen?"  "Are both legs swollen or just one leg?"     Bilateral feet and ankles  3. SEVERITY: "How bad is the swelling?" (e.g., localized; mild, moderate, severe)   - Localized: Small area of swelling localized to one leg.   - MILD pedal edema: Swelling limited to foot and ankle, pitting edema < 1/4 inch (6 mm) deep, rest and elevation eliminate most or all swelling.   - MODERATE edema: Swelling of lower leg to knee, pitting edema > 1/4 inch (6 mm) deep, rest and elevation only partially reduce swelling.   - SEVERE edema: Swelling extends above knee, facial or hand swelling present.      Mild, swelling to feet and ankles  4. REDNESS: "Does the swelling look red or  infected?"     No 5. PAIN: "Is the swelling painful to touch?" If Yes, ask: "How painful is it?"   (Scale 1-10; mild, moderate or severe)     No pain in ankles or feet  6. FEVER: "Do you have a fever?" If Yes, ask: "What is it, how was it measured, and when did it start?"      No 7. CAUSE: "What do you think is causing the leg swelling?"     Unsure  8. MEDICAL HISTORY: "Do you have a history of blood clots (e.g., DVT), cancer, heart failure, kidney disease, or liver failure?"     No 9. RECURRENT SYMPTOM: "Have you had leg swelling before?" If Yes, ask: "When was the last time?" "What happened that time?"     No 10. OTHER SYMPTOMS: "Do you have any other symptoms?" (e.g., chest pain, difficulty breathing)       Right leg pain which is an ongoing problem  Protocols used: Leg Swelling and Edema-A-AH

## 2023-09-06 NOTE — Telephone Encounter (Signed)
 FYI

## 2023-09-07 ENCOUNTER — Ambulatory Visit (INDEPENDENT_AMBULATORY_CARE_PROVIDER_SITE_OTHER): Payer: Medicare (Managed Care) | Admitting: Internal Medicine

## 2023-09-07 ENCOUNTER — Encounter: Payer: Self-pay | Admitting: Internal Medicine

## 2023-09-07 VITALS — BP 129/77 | HR 81 | Ht 67.0 in

## 2023-09-07 DIAGNOSIS — R600 Localized edema: Secondary | ICD-10-CM

## 2023-09-07 DIAGNOSIS — M5441 Lumbago with sciatica, right side: Secondary | ICD-10-CM

## 2023-09-07 MED ORDER — TRAMADOL HCL 50 MG PO TABS: 50.0000 mg | ORAL_TABLET | Freq: Three times a day (TID) | ORAL | 0 refills | Status: AC | PRN

## 2023-09-07 NOTE — Assessment & Plan Note (Signed)
 Slightly improved with gabapentin  and Tramadol . MRI still pending - will call the reading room to expedite it.

## 2023-09-07 NOTE — Patient Instructions (Signed)
 Continue tramadol  as needed.  Continue gabapentin  if tolerated.  Elevate legs - "toes above the nose"  Wear compression stockings during the day to reduce the leg swelling.

## 2023-09-07 NOTE — Progress Notes (Signed)
 Date:  09/07/2023   Name:  Molly Ruiz   DOB:  1940-08-26   MRN:  [id number]   Chief Complaint: No chief complaint on file.  Back Pain This is a recurrent problem. The current episode started 1 to 4 weeks ago. The problem occurs constantly. Progression since onset: may be slightly better. The pain is present in the lumbar spine. The quality of the pain is described as aching. The pain radiates to the right thigh. The pain is moderate. The symptoms are aggravated by lying down and twisting. Pertinent negatives include no chest pain, fever or headaches. Treatments tried: hx surgery; recent gabapentin  tx has helped some.  LE Edema - onset 2 days ago, mild and slightly improved today. She has not been able to sleep in the bed and does not have a recliner.  She sits in a wing chair or on the sofa, sometimes reclines on the sofa. She has not been wearing compression stockings.  Review of Systems  Constitutional:  Negative for chills and fever.  Respiratory:  Negative for chest tightness and shortness of breath.   Cardiovascular:  Positive for leg swelling. Negative for chest pain and palpitations.  Musculoskeletal:  Positive for back pain.  Neurological:  Negative for dizziness and headaches.     Lab Results  Component Value Date   NA 141 03/13/2023   K 4.4 03/13/2023   CO2 21 03/13/2023   GLUCOSE 147 (H) 03/13/2023   BUN 15 03/13/2023   CREATININE 0.95 03/13/2023   CALCIUM  10.2 03/13/2023   EGFR 60 03/13/2023   GFRNONAA 55 (L) 11/25/2019   Lab Results  Component Value Date   CHOL 189 11/08/2022   HDL 68 11/08/2022   LDLCALC 95 11/08/2022   TRIG 155 (H) 11/08/2022   CHOLHDL 2.8 11/08/2022   Lab Results  Component Value Date   TSH 1.420 11/08/2022   Lab Results  Component Value Date   HGBA1C 6.4 (A) 07/14/2023   Lab Results  Component Value Date   WBC 4.4 11/08/2022   HGB 13.0 11/08/2022   HCT 39.1 11/08/2022   MCV 89 11/08/2022   PLT 216 11/08/2022   Lab Results   Component Value Date   ALT 13 11/08/2022   AST 21 11/08/2022   ALKPHOS 123 (H) 11/08/2022   BILITOT 0.4 11/08/2022   Lab Results  Component Value Date   VD25OH 37.3 11/02/2021     Patient Active Problem List   Diagnosis Date Noted   Acute right-sided low back pain with right-sided sciatica 08/25/2023   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 03/09/2022   Stage 3a chronic kidney disease (HCC) 11/29/2021   Acute pain of right shoulder 09/24/2020   Statin myopathy 03/31/2020   Gastroesophageal reflux disease 09/19/2019   Special screening for malignant neoplasms, colon    Tubular adenoma of colon    Anxiety disorder 06/13/2019   Osteopenia determined by x-ray 06/06/2018   Essential hypertension 04/26/2018   Eczema 10/25/2017   Slow transit constipation 02/06/2017   Primary osteoarthritis of left hip 01/12/2017   OSA on CPAP 01/02/2017   Type II diabetes mellitus with complication (HCC) 06/24/2016   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   Hyperlipidemia associated with type 2 diabetes mellitus (HCC) 06/24/2016   Obesity (BMI 30.0-34.9) 06/24/2016   Vitamin D  deficiency 06/24/2016   History of shingles 06/24/2016   Allergic rhinitis 06/24/2016    Allergies  Allergen Reactions   Atorvastatin  Other (See Comments)  Peripheral neuropathy and myalgia   Influenza Vaccines Rash    Past Surgical History:  Procedure Laterality Date   ABDOMINAL HYSTERECTOMY     BILATERAL CARPAL TUNNEL RELEASE     CATARACT EXTRACTION W/PHACO Right 11/12/2018   Procedure: CATARACT EXTRACTION PHACO AND INTRAOCULAR LENS PLACEMENT (IOC)  RIGHT DIABETIC;  Surgeon: Rosa College, MD;  Location: [facility];  Service: Ophthalmology;  Laterality: Right;  Diabetic - diet controlled sleep apnea   CERVICAL FUSION  2004   COLONOSCOPY WITH PROPOFOL  N/A 07/22/2019   Procedure: COLONOSCOPY WITH BIOPSIES;  Surgeon: Marnee Sink, MD;  Location: [facility];   Service: Endoscopy;  Laterality: N/A;  Diabetic - diet controlled priority 4   FOOT SURGERY     HAND SURGERY Left 06/2022   palmar cyst removed   LUMBAR DISC SURGERY  1998   POLYPECTOMY N/A 07/22/2019   Procedure: POLYPECTOMY;  Surgeon: Marnee Sink, MD;  Location: [facility];  Service: Endoscopy;  Laterality: N/A;    Social History   Tobacco Use   Smoking status: Never   Smokeless tobacco: Never  Vaping Use   Vaping status: Never Used  Substance Use Topics   Alcohol use: No   Drug use: No     Medication list has been reviewed and updated.  Current Meds  Medication Sig   acetaminophen  (TYLENOL ) 500 MG tablet Take 2 tablets (1,000 mg total) by mouth 2 (two) times daily.   amLODipine  (NORVASC ) 5 MG tablet Take 1 tablet by mouth once daily   baclofen  (LIORESAL ) 10 MG tablet Take 1 tablet (10 mg total) by mouth 3 (three) times daily.   Bisacodyl (LAXATIVE PO) Take by mouth. Generic OTC PRN   buPROPion  (WELLBUTRIN  XL) 300 MG 24 hr tablet Take 1 tablet by mouth once daily   CALCIUM  CITRATE PO Take 1,200 mg by mouth daily.   cholecalciferol (VITAMIN D3) 25 MCG (1000 UNIT) tablet Take 1,000 Units by mouth daily.   ezetimibe  (ZETIA ) 10 MG tablet Take 1 tablet by mouth once daily   famotidine  (PEPCID ) 40 MG tablet Take 1 tablet (40 mg total) by mouth at bedtime.   gabapentin  (NEURONTIN ) 100 MG capsule Take 1 capsule (100 mg total) by mouth 2 (two) times daily.   glucose blood (ONETOUCH ULTRA) test strip Test Blood Sugar twice daily.   irbesartan  (AVAPRO ) 300 MG tablet Take 1 tablet (300 mg total) by mouth daily.   meloxicam  (MOBIC ) 15 MG tablet Take 1 tablet by mouth once daily   Multiple Vitamins-Minerals (MULTIVITAMIN ADULTS 50+ PO) Take 1 tablet by mouth daily.   mupirocin  ointment (BACTROBAN ) 2 % Apply 1 Application topically 2 (two) times daily.   NON FORMULARY CPAP @@ 12 cm H20   nystatin  cream (MYCOSTATIN ) Apply to affected area 2 times daily   nystatin -triamcinolone   ointment (MYCOLOG) Apply 1 Application topically 2 (two) times daily.   Omega-3 Fatty Acids (FISH OIL) 1000 MG CAPS Take 1 capsule by mouth daily.   OneTouch Delica Lancets 33G MISC Inject 1 each into the skin daily.   REFRESH 1.4-0.6 % SOLN Place 1 drop into both eyes as needed.       09/07/2023   11:01 AM 08/25/2023    1:47 PM 08/21/2023    3:15 PM 07/14/2023    4:05 PM  GAD 7 : Generalized Anxiety Score  Nervous, Anxious, on Edge 1 0 1 1  Control/stop worrying 0 0 0 0  Worry too much - different things 0  0 0  Trouble relaxing 1   0  Restless 1  0 0  Easily annoyed or irritable 1  0 0  Afraid - awful might happen 0  0 0  Total GAD 7 Score 4   1  Anxiety Difficulty Not difficult at all  Not difficult at all Not difficult at all       09/07/2023   11:01 AM 08/25/2023    1:47 PM 08/21/2023    3:15 PM  Depression screen PHQ 2/9  Decreased Interest 0 0 0  Down, Depressed, Hopeless 0 0 0  PHQ - 2 Score 0 0 0  Altered sleeping 1  0  Tired, decreased energy 1  1  Change in appetite 0  0  Feeling bad or failure about yourself  0  0  Trouble concentrating 0  0  Moving slowly or fidgety/restless 0  0  Suicidal thoughts 0  0  PHQ-9 Score 2  1  Difficult doing work/chores Not difficult at all  Not difficult at all    BP Readings from Last 3 Encounters:  09/07/23 129/77  08/25/23 138/76  08/21/23 138/82    Physical Exam Vitals and nursing note reviewed.  Constitutional:      General: She is not in acute distress.    Appearance: Normal appearance. She is well-developed.  HENT:     Head: Normocephalic and atraumatic.  Cardiovascular:     Rate and Rhythm: Normal rate and regular rhythm.  Pulmonary:     Effort: Pulmonary effort is normal. No respiratory distress.     Breath sounds: No wheezing or rhonchi.  Musculoskeletal:     Right lower leg: No edema.     Left lower leg: No edema.  Skin:    General: Skin is warm and dry.     Findings: No rash.  Neurological:     Mental  Status: She is alert and oriented to person, place, and time.  Psychiatric:        Mood and Affect: Mood normal.        Behavior: Behavior normal.     Wt Readings from Last 3 Encounters:  08/25/23 187 lb 5 oz (85 kg)  08/18/23 192 lb 8 oz (87.3 kg)  07/14/23 192 lb 8 oz (87.3 kg)    BP 129/77   Pulse 81   Ht 5\' 7"  (1.702 m)   SpO2 96%   BMI 29.34 kg/m   Assessment and Plan:  Problem List Items Addressed This Visit       Unprioritized   Acute right-sided low back pain with right-sided sciatica   Slightly improved with gabapentin  and Tramadol . MRI still pending - will call the reading room to expedite it.      Relevant Medications   traMADol  (ULTRAM ) 50 MG tablet   Other Visit Diagnoses       Localized edema    -  Primary   likely due to dependent position; possible SE to gabapentin . recommend elevation, light compression stockings on in AM and off PM       No follow-ups on file.    Sheron Dixons, MD [facility] [place]

## 2023-09-08 ENCOUNTER — Encounter: Payer: Self-pay | Admitting: Internal Medicine

## 2023-09-08 ENCOUNTER — Other Ambulatory Visit: Payer: Self-pay | Admitting: Internal Medicine

## 2023-09-08 DIAGNOSIS — M5441 Lumbago with sciatica, right side: Secondary | ICD-10-CM

## 2023-09-08 NOTE — Progress Notes (Unsigned)
 Referring Physician:  Sheron Dixons, MD [address] [place],  [place] [address]  Primary Physician:  Sheron Dixons, MD  History of Present Illness: 09/11/2023 [name] is here today with a chief complaint of acute on chronic back pain.  She states approximately 2-1/2 weeks ago she woke up with severe pain extending from her low back down the front of her right leg stopping around the level of her ankle.  This has been associated with sharp shooting pain and numbness.  She feels as though her leg could give out on her and has been using hand crutches to walk.  At baseline she has back pain that extends in the both legs, making them feel heavy.  She feels as though it does not allow her to walk a long distance, but is worse at sometimes compared to others.  It is not constant.  She denies any shooting pain down her left leg.  She denies any saddle anesthesia, or incontinence.  She has been using Tylenol , baclofen , gabapentin , meloxicam  and tramadol  intermittently and not consistently.    Duration: 2 1/2 weeks Severity: 9/10  Precipitating: aggravated by walking Modifying factors: made better by nothing Weakness: none Timing: constant  Bowel/Bladder Dysfunction: none  Conservative measures:  Physical therapy: has not participated in Multimodal medical therapy including regular antiinflammatories: tylenol , baclofen , gabapentin , meloxicam , tramadol  Injections: no epidural steroid injections  Past Surgery: Back Surgery in 1998  Molly Ruiz has no symptoms of cervical myelopathy.  The symptoms are causing a significant impact on the patient's life.   Review of Systems:  A 10 point review of systems is negative, except for the pertinent positives and negatives detailed in the HPI.  Past Medical History: Past Medical History:  Diagnosis Date   Complication of anesthesia    Hair falls out.   Depression    Diabetes mellitus without complication (HCC)     Hyperlipidemia    Hypertension    Sleep apnea    CPAP   Wears dentures    partial upper    Past Surgical History: Past Surgical History:  Procedure Laterality Date   ABDOMINAL HYSTERECTOMY     BILATERAL CARPAL TUNNEL RELEASE     CATARACT EXTRACTION W/PHACO Right 11/12/2018   Procedure: CATARACT EXTRACTION PHACO AND INTRAOCULAR LENS PLACEMENT (IOC)  RIGHT DIABETIC;  Surgeon: Rosa College, MD;  Location: [facility];  Service: Ophthalmology;  Laterality: Right;  Diabetic - diet controlled sleep apnea   CERVICAL FUSION  2004   COLONOSCOPY WITH PROPOFOL  N/A 07/22/2019   Procedure: COLONOSCOPY WITH BIOPSIES;  Surgeon: Marnee Sink, MD;  Location: [facility];  Service: Endoscopy;  Laterality: N/A;  Diabetic - diet controlled priority 4   FOOT SURGERY     HAND SURGERY Left 06/2022   palmar cyst removed   LUMBAR DISC SURGERY  1998   POLYPECTOMY N/A 07/22/2019   Procedure: POLYPECTOMY;  Surgeon: Marnee Sink, MD;  Location: [facility];  Service: Endoscopy;  Laterality: N/A;    Allergies: Allergies as of 09/11/2023 - Review Complete 09/11/2023  Allergen Reaction Noted   Atorvastatin  Other (See Comments) 04/26/2018   Influenza vaccines Rash 06/24/2016    Medications: Outpatient Encounter Medications as of 09/11/2023  Medication Sig   acetaminophen  (TYLENOL ) 500 MG tablet Take 2 tablets (1,000 mg total) by mouth 2 (two) times daily.   amLODipine  (NORVASC ) 5 MG tablet Take 1 tablet by mouth once daily   Bisacodyl (LAXATIVE PO) Take by  mouth. Generic OTC PRN   buPROPion  (WELLBUTRIN  XL) 300 MG 24 hr tablet Take 1 tablet by mouth once daily   CALCIUM  CITRATE PO Take 1,200 mg by mouth daily.   cholecalciferol (VITAMIN D3) 25 MCG (1000 UNIT) tablet Take 1,000 Units by mouth daily.   ezetimibe  (ZETIA ) 10 MG tablet Take 1 tablet by mouth once daily   famotidine  (PEPCID ) 40 MG tablet Take 1 tablet (40 mg total) by mouth at bedtime.   glucose blood (ONETOUCH  ULTRA) test strip Test Blood Sugar twice daily.   irbesartan  (AVAPRO ) 300 MG tablet Take 1 tablet (300 mg total) by mouth daily.   meloxicam  (MOBIC ) 15 MG tablet Take 1 tablet by mouth once daily   Multiple Vitamins-Minerals (MULTIVITAMIN ADULTS 50+ PO) Take 1 tablet by mouth daily.   mupirocin  ointment (BACTROBAN ) 2 % Apply 1 Application topically 2 (two) times daily.   NON FORMULARY CPAP @@ 12 cm H20   nystatin  cream (MYCOSTATIN ) Apply to affected area 2 times daily   nystatin -triamcinolone  ointment (MYCOLOG) Apply 1 Application topically 2 (two) times daily.   Omega-3 Fatty Acids (FISH OIL) 1000 MG CAPS Take 1 capsule by mouth daily.   OneTouch Delica Lancets 33G MISC Inject 1 each into the skin daily.   REFRESH 1.4-0.6 % SOLN Place 1 drop into both eyes as needed.   traMADol  (ULTRAM ) 50 MG tablet Take 1 tablet (50 mg total) by mouth every 8 (eight) hours as needed for up to 5 days for severe pain (pain score 7-10).   [DISCONTINUED] baclofen  (LIORESAL ) 10 MG tablet Take 1 tablet (10 mg total) by mouth 3 (three) times daily.   [DISCONTINUED] gabapentin  (NEURONTIN ) 100 MG capsule Take 1 capsule (100 mg total) by mouth 2 (two) times daily.   Facility-Administered Encounter Medications as of 09/11/2023  Medication   cyclobenzaprine (FLEXERIL) tablet 5 mg    Social History: Social History   Tobacco Use   Smoking status: Never   Smokeless tobacco: Never  Vaping Use   Vaping status: Never Used  Substance Use Topics   Alcohol use: No   Drug use: No    Family Medical History: Family History  Problem Relation Age of Onset   Diabetes Mother    Stroke Mother    Heart disease Sister    Stroke Brother    Prostate cancer Brother    Breast cancer Other 45    Physical Examination: @VITALWITHPAIN @  General: Patient is well developed, well nourished, calm, collected, and in no apparent distress. Attention to examination is appropriate.  Psychiatric: Patient is  non-anxious.  Head:  Pupils equal, round, and reactive to light.  ENT:  Oral mucosa appears well hydrated.  Neck:   Supple.  Full range of motion.  Respiratory: Patient is breathing without any difficulty.  Extremities: No edema.  Vascular: Palpable dorsal pedal pulses.  Skin:   On exposed skin, there are no abnormal skin lesions.  NEUROLOGICAL:     Awake, alert, oriented to person, place, and time.  Speech is clear and fluent. Fund of knowledge is appropriate.   Cranial Nerves: Pupils equal round and reactive to light.  Facial tone is symmetric.    ROM of spine: Patient has tenderness palpation of her lumbar paraspinals and extending out near her right hip.  Strength:  Side Iliopsoas Quads Hamstring PF DF EHL  R 5 5 5 5 5 5   L 5 5 5 5 5 5    Reflexes are 1+ at bilateral patella, absent Achilles.  Clonus is not present.  Toes are down-going.  Bilateral upper and lower extremity sensation is intact to light touch, some allodynia present Did not witness gait, patient in wheelchair  Medical Decision Making  Imaging: MRI Lumbar spine 09/07/23:  IMPRESSION: 1. 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 Bulging of the disc with biforaminal protrusions. Severe multifactorial stenosis at the disc level. Bilateral foraminal stenosis could compress either or both L4 nerves. The facet arthritis would likely be painful. 2. L2-3: Disc bulge. Facet and ligamentous hypertrophy. Mild multifactorial stenosis but without evidence of focal neural compression. 3. L3-4: Disc bulge. Facet and ligamentous hypertrophy. Mild stenosis of both lateral recesses but no definite neural compression. 4. L5-S1: Disc bulge. Mild facet degeneration. Mild proximal foraminal stenosis but without definite compression of the exiting L5 nerves.    I have personally reviewed the images and agree with the above interpretation.  Assessment and Plan: Ms. Sweat is a pleasant 83 y.o.  female is here today with a chief complaint of acute on chronic back pain.  She previously had surgery at L4-5 and 9098.  She has had no problems since.  She states approximately 2-1/2 weeks ago she woke up with severe pain extending from her low back down the front of her right leg stopping around the level of her ankle.  This has been associated with sharp shooting pain and numbness.  She feels as though her leg could give out on her and has been using hand crutches to walk.  At baseline she has back pain that extends in the both legs, making them feel heavy.  She feels as though it does not allow her to walk a long distance, but is worse at sometimes compared to others.  It is not constant.  She denies any shooting pain down her left leg.  She denies any saddle anesthesia, or incontinence.  She has been using Tylenol , baclofen , gabapentin , meloxicam  and tramadol  intermittently and not consistently.  On examination she is full strength.  She does have tenderness palpation of her lumbar paraspinals extending out near her right hip.  MRI is reviewed which shows multilevel degenerative changes as well as severe stenosis at L4-5 as well as chronic facet arthropathy.  It was a pleasure to see patient in clinic today.  Plan for x-rays today to include flexion and extension.  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  Pain clinic referral sent for L4 ESI.  In the meantime referral was also sent for physical therapy.  We went over patient's pain regimen in depth.  Plan on increasing Tylenol  to 1000 mg 3 times a day.  Introducing meloxicam  at 7.5 mg once a day given good current renal function.  Plan to switch muscle relaxer to Flexeril.  Patient to stop gabapentin  due to side effects.  Plan to see back in approximately 6 weeks.  Encouraged patient to reach out to us  in the meantime for any questions or concerns.  Red flag symptoms were reviewed.   Thank you  for involving me in the care of this patient.   I spent a total of 60 minutes in both face-to-face and non-face-to-face activities for this visit on the date of this encounter including preparing to see the patient, review of outside x-rays and MRI, obtaining and reviewing separately obtained history, performing medical examination, counseling the patient and her family, ordering additional medication and tests, placing referrals, documenting clinical information, independently interpreting results, and care  coordination  Ludwig Safer, PA-C Dept. of Neurosurgery

## 2023-09-08 NOTE — Progress Notes (Unsigned)
 Date:  09/08/2023   Name:  Molly Ruiz   DOB:  May 06, 1941   MRN:  [id number]   Chief Complaint: No chief complaint on file.  HPI  Review of Systems   Lab Results  Component Value Date   NA 141 03/13/2023   K 4.4 03/13/2023   CO2 21 03/13/2023   GLUCOSE 147 (H) 03/13/2023   BUN 15 03/13/2023   CREATININE 0.95 03/13/2023   CALCIUM  10.2 03/13/2023   EGFR 60 03/13/2023   GFRNONAA 55 (L) 11/25/2019   Lab Results  Component Value Date   CHOL 189 11/08/2022   HDL 68 11/08/2022   LDLCALC 95 11/08/2022   TRIG 155 (H) 11/08/2022   CHOLHDL 2.8 11/08/2022   Lab Results  Component Value Date   TSH 1.420 11/08/2022   Lab Results  Component Value Date   HGBA1C 6.4 (A) 07/14/2023   Lab Results  Component Value Date   WBC 4.4 11/08/2022   HGB 13.0 11/08/2022   HCT 39.1 11/08/2022   MCV 89 11/08/2022   PLT 216 11/08/2022   Lab Results  Component Value Date   ALT 13 11/08/2022   AST 21 11/08/2022   ALKPHOS 123 (H) 11/08/2022   BILITOT 0.4 11/08/2022   Lab Results  Component Value Date   VD25OH 37.3 11/02/2021     Patient Active Problem List   Diagnosis Date Noted   Acute right-sided low back pain with right-sided sciatica 08/25/2023   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 03/09/2022   Stage 3a chronic kidney disease (HCC) 11/29/2021   Acute pain of right shoulder 09/24/2020   Statin myopathy 03/31/2020   Gastroesophageal reflux disease 09/19/2019   Special screening for malignant neoplasms, colon    Tubular adenoma of colon    Anxiety disorder 06/13/2019   Osteopenia determined by x-ray 06/06/2018   Essential hypertension 04/26/2018   Eczema 10/25/2017   Slow transit constipation 02/06/2017   Primary osteoarthritis of left hip 01/12/2017   OSA on CPAP 01/02/2017   Type II diabetes mellitus with complication (HCC) 06/24/2016   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   Hyperlipidemia associated with type 2 diabetes mellitus (HCC)  06/24/2016   Obesity (BMI 30.0-34.9) 06/24/2016   Vitamin D  deficiency 06/24/2016   History of shingles 06/24/2016   Allergic rhinitis 06/24/2016    Allergies  Allergen Reactions   Atorvastatin  Other (See Comments)    Peripheral neuropathy and myalgia   Influenza Vaccines Rash    Past Surgical History:  Procedure Laterality Date   ABDOMINAL HYSTERECTOMY     BILATERAL CARPAL TUNNEL RELEASE     CATARACT EXTRACTION W/PHACO Right 11/12/2018   Procedure: CATARACT EXTRACTION PHACO AND INTRAOCULAR LENS PLACEMENT (IOC)  RIGHT DIABETIC;  Surgeon: Rosa College, MD;  Location: [facility];  Service: Ophthalmology;  Laterality: Right;  Diabetic - diet controlled sleep apnea   CERVICAL FUSION  2004   COLONOSCOPY WITH PROPOFOL  N/A 07/22/2019   Procedure: COLONOSCOPY WITH BIOPSIES;  Surgeon: Marnee Sink, MD;  Location: [facility];  Service: Endoscopy;  Laterality: N/A;  Diabetic - diet controlled priority 4   FOOT SURGERY     HAND SURGERY Left 06/2022   palmar cyst removed   LUMBAR DISC SURGERY  1998   POLYPECTOMY N/A 07/22/2019   Procedure: POLYPECTOMY;  Surgeon: Marnee Sink, MD;  Location: [facility];  Service: Endoscopy;  Laterality: N/A;    Social History   Tobacco Use   Smoking status:  Never   Smokeless tobacco: Never  Vaping Use   Vaping status: Never Used  Substance Use Topics   Alcohol use: No   Drug use: No     Medication list has been reviewed and updated.  No outpatient medications have been marked as taking for the 09/08/23 encounter (Orders Only) with Sheron Dixons, MD.       09/07/2023   11:01 AM 08/25/2023    1:47 PM 08/21/2023    3:15 PM 07/14/2023    4:05 PM  GAD 7 : Generalized Anxiety Score  Nervous, Anxious, on Edge 1 0 1 1  Control/stop worrying 0 0 0 0  Worry too much - different things 0  0 0  Trouble relaxing 1   0  Restless 1  0 0  Easily annoyed or irritable 1  0 0  Afraid - awful might happen 0  0 0  Total GAD  7 Score 4   1  Anxiety Difficulty Not difficult at all  Not difficult at all Not difficult at all       09/07/2023   11:01 AM 08/25/2023    1:47 PM 08/21/2023    3:15 PM  Depression screen PHQ 2/9  Decreased Interest 0 0 0  Down, Depressed, Hopeless 0 0 0  PHQ - 2 Score 0 0 0  Altered sleeping 1  0  Tired, decreased energy 1  1  Change in appetite 0  0  Feeling bad or failure about yourself  0  0  Trouble concentrating 0  0  Moving slowly or fidgety/restless 0  0  Suicidal thoughts 0  0  PHQ-9 Score 2  1  Difficult doing work/chores Not difficult at all  Not difficult at all    BP Readings from Last 3 Encounters:  09/07/23 129/77  08/25/23 138/76  08/21/23 138/82    Physical Exam  Wt Readings from Last 3 Encounters:  08/25/23 187 lb 5 oz (85 kg)  08/18/23 192 lb 8 oz (87.3 kg)  07/14/23 192 lb 8 oz (87.3 kg)    There were no vitals taken for this visit.  Assessment and Plan:  Problem List Items Addressed This Visit   None   No follow-ups on file.    Sheron Dixons, MD [facility] [place]

## 2023-09-11 ENCOUNTER — Encounter: Payer: Self-pay | Admitting: Physician Assistant

## 2023-09-11 ENCOUNTER — Ambulatory Visit: Payer: Medicare (Managed Care) | Admitting: Physician Assistant

## 2023-09-11 ENCOUNTER — Ambulatory Visit
Admission: RE | Admit: 2023-09-11 | Discharge: 2023-09-11 | Disposition: A | Discharge status: Home / Self Care | Payer: Medicare (Managed Care) | Source: Ambulatory Visit | Attending: Physician Assistant | Admitting: Physician Assistant

## 2023-09-11 VITALS — BP 134/80 | Ht 67.0 in | Wt 187.0 lb

## 2023-09-11 DIAGNOSIS — M5416 Radiculopathy, lumbar region: Secondary | ICD-10-CM | POA: Insufficient documentation

## 2023-09-11 DIAGNOSIS — M48062 Spinal stenosis, lumbar region with neurogenic claudication: Secondary | ICD-10-CM | POA: Diagnosis not present

## 2023-09-11 MED ORDER — CYCLOBENZAPRINE HCL 5 MG PO TABS
5.0000 mg | ORAL_TABLET | Freq: Three times a day (TID) | ORAL | Status: DC | PRN
Start: 2023-09-11 — End: 2023-09-15

## 2023-09-15 ENCOUNTER — Telehealth: Payer: Self-pay

## 2023-09-15 MED ORDER — CYCLOBENZAPRINE HCL 5 MG PO TABS: 5.0000 mg | ORAL_TABLET | Freq: Three times a day (TID) | ORAL | 3 refills | Status: DC | PRN

## 2023-09-15 NOTE — Telephone Encounter (Signed)
 Received authorization request from [employer] for flexeril /cyclobenzaprine . Key: [id number].  This has been approved and is valid 08/16/23-09/14/24. Case ID [id number]. Approval faxed to [facility].

## 2023-09-15 NOTE — Addendum Note (Signed)
 Addended by: Epiphany Seltzer on: 09/15/2023 04:52 PM   Modules accepted: Orders

## 2023-09-25 ENCOUNTER — Other Ambulatory Visit: Payer: Self-pay | Admitting: Internal Medicine

## 2023-09-25 DIAGNOSIS — M5441 Lumbago with sciatica, right side: Secondary | ICD-10-CM

## 2023-09-26 ENCOUNTER — Ambulatory Visit
Payer: Medicare (Managed Care) | Attending: Student in an Organized Health Care Education/Training Program | Admitting: Student in an Organized Health Care Education/Training Program

## 2023-09-26 ENCOUNTER — Encounter: Payer: Self-pay | Admitting: Student in an Organized Health Care Education/Training Program

## 2023-09-26 DIAGNOSIS — M48062 Spinal stenosis, lumbar region with neurogenic claudication: Secondary | ICD-10-CM | POA: Diagnosis not present

## 2023-09-26 DIAGNOSIS — M5416 Radiculopathy, lumbar region: Secondary | ICD-10-CM | POA: Insufficient documentation

## 2023-09-26 DIAGNOSIS — M48061 Spinal stenosis, lumbar region without neurogenic claudication: Secondary | ICD-10-CM | POA: Insufficient documentation

## 2023-09-26 NOTE — Progress Notes (Signed)
 Safety precautions to be maintained throughout the outpatient stay will include: orient to surroundings, keep bed in low position, maintain call bell within reach at all times, provide assistance with transfer out of bed and ambulation.

## 2023-09-26 NOTE — Telephone Encounter (Signed)
 Requested medication (s) are due for refill today -no  Requested medication (s) are on the active medication list -no  Future visit scheduled -yes  Last refill: 09/07/23 #15  Notes to clinic: no longer listed on current medication list, non delegated Rx  Requested Prescriptions  Pending Prescriptions Disp Refills   traMADol  (ULTRAM ) 50 MG tablet [Pharmacy Med Name: traMADol  HCl 50 MG Oral Tablet] 15 tablet 0    Sig: TAKE 1 TABLET BY MOUTH EVERY 8 HOURS AS NEEDED FOR  UP  TO  5  DAYS  FOR  SEVERE  PAIN  (PAIN  SCORE  7-10)     Not Delegated - Analgesics:  Opioid Agonists Failed - 09/26/2023  2:20 PM      Failed - This refill cannot be delegated      Failed - Urine Drug Screen completed in last 360 days      Passed - Valid encounter within last 3 months    Recent Outpatient Visits           2 weeks ago Localized edema   [place] [facility] at [facility], Chales [place], MD   1 month ago Acute right-sided low back pain with right-sided sciatica   [place] [facility] at [facility], Chales [place], MD   1 month ago Acute right-sided low back pain with right-sided sciatica   [place] [facility] at [facility], Chales [place], MD   1 month ago Candida infection   [place] [facility] at [facility], Chales [place], MD   2 months ago Type II diabetes mellitus with complication [facility])   [place] [facility] at [facility], Chales [place], MD       Future Appointments             In 1 month Gala Jubilee, Chales [place], MD [facility] at [facility], [facility]               Requested Prescriptions  Pending Prescriptions Disp Refills   traMADol  (ULTRAM ) 50 MG tablet [Pharmacy Med Name: traMADol  HCl 50 MG Oral Tablet] 15 tablet 0    Sig: TAKE 1 TABLET BY MOUTH EVERY 8 HOURS AS NEEDED FOR  UP  TO  5  DAYS  FOR   SEVERE  PAIN  (PAIN  SCORE  7-10)     Not Delegated - Analgesics:  Opioid Agonists Failed - 09/26/2023  2:20 PM      Failed - This refill cannot be delegated      Failed - Urine Drug Screen completed in last 360 days      Passed - Valid encounter within last 3 months    Recent Outpatient Visits           2 weeks ago Localized edema   [place] [facility] at [facility], Chales Colorado, MD   1 month ago Acute right-sided low back pain with right-sided sciatica   [place] [facility] at [facility], Chales [place], MD   1 month ago Acute right-sided low back pain with right-sided sciatica   [facility] at [facility], Chales [place], MD   1 month ago Candida infection   [facility] at [facility], Chales [place], MD  2 months ago Type II diabetes mellitus with complication [facility])   [place] [facility] Medicine at [facility], Chales [place], MD       Future Appointments             In 1 month Gala Jubilee, Chales [place], MD [facility] Medicine at [facility], [facility]

## 2023-09-26 NOTE — Telephone Encounter (Signed)
 Please review

## 2023-09-26 NOTE — Progress Notes (Signed)
 PROVIDER NOTE: Interpretation of information contained herein should be left to medically-trained personnel. Specific patient instructions are provided elsewhere under "Patient Instructions" section of medical record. This document was created in part using AI and STT-dictation technology, any transcriptional errors that may result from this process are unintentional.  Patient: Molly Ruiz  Service: E/M Encounter  Provider: Cephus Collin, MD  DOB: 05/29/1940  Delivery: Face-to-face  Specialty: Interventional Pain Management  MRN: [id number]  Setting: Ambulatory outpatient facility  Specialty designation: 09  Type: New Patient  Location: Outpatient office facility  PCP: Sheron Dixons, MD  DOS: 09/26/2023    Referring Prov.: Ludwig Safer, PA-C   Primary Reason(s) for Visit: Encounter for initial evaluation of one or more chronic problems (new to examiner) potentially causing chronic pain, and posing a threat to normal musculoskeletal function. (Level of risk: High) CC: Hip Pain (Right) and Leg Pain (Right lower numbness )  HPI  Molly Ruiz is a 83 y.o. year old, female patient, who comes for the first time to our practice referred by Ludwig Safer, PA-C for our initial evaluation of her chronic pain. She has Type II diabetes mellitus with complication (HCC); Sciatica associated with disorder of lumbar spine; Hyperlipidemia associated with type 2 diabetes mellitus (HCC); Obesity (BMI 30.0-34.9); Vitamin D  deficiency; History of shingles; Allergic rhinitis; OSA on CPAP; Primary osteoarthritis of left hip; Slow transit constipation; Eczema; Essential hypertension; Osteopenia determined by x-ray; Anxiety disorder; Special screening for malignant neoplasms, colon; Tubular adenoma of colon; Gastroesophageal reflux disease; Statin myopathy; Acute pain of right shoulder; Stage 3a chronic kidney disease (HCC); 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 Today she comes in for evaluation of her Hip Pain (Right) and Leg Pain (Right lower numbness )  Pain Assessment: Location: Right Hip Radiating: Denies Onset: More than a month ago Duration: Chronic pain Quality: Constant, Sharp, Numbness, Aching ("Agonizing, feeling of weight, and hot") Severity: 2 /10 (subjective, self-reported pain score)  Effect on ADL: Limits ADLs Timing: Constant Modifying factors: reesting BP: (!) 164/70  HR: 74  Onset and Duration: Present less than 3 months Cause of pain: Unknown Severity: Getting better, NAS-11 at its worse: 10/10, NAS-11 at its best: 5/10, NAS-11 now: 2/10, and NAS-11 on the average: 9/10 Timing: Night Aggravating Factors: Prolonged sitting, Walking, Walking uphill, and Walking downhill Alleviating Factors: Resting Associated Problems: Numbness, Temperature changes, and Tingling Quality of Pain: Agonizing, Feeling of weight, and Hot Previous Examinations or Tests: X-rays Previous Treatments: The patient denies tx  Molly Ruiz is being evaluated for possible interventional pain management therapies for the treatment of her chronic pain.  Discussed the use of AI scribe software for clinical note transcription with the patient, who gave verbal consent to proceed.  History of Present Illness   Molly Ruiz is an 83 year old female who presents with back pain radiating to the right leg. She was referred by Dr. Vito Grippe, a neurosurgeon, for evaluation of her back pain.  She has been experiencing severe pain in her lower back radiating to her right leg, specifically from her hip to her bottom and down the front of her leg. The pain has been present for about a month. She describes numbness in the front of her right leg from the ankle halfway up to the knee, but not above the knee.  Occasionally,  she experiences numbness in her left leg, but it is less frequent and less severe than in the right leg. The numbness in the left leg sometimes resolves, leaving the leg sore.  She has a history of back surgery in 1999 for severe stenosis at L4, L5, during which her sciatic nerve was reported to be rubbing against the spine. No hardware was placed during the surgery.  She has been using crutches due to difficulty walking and reports that the pain has been so severe that she could not get out of bed or walk without support.  No current use of blood thinners.      Meds   Current Outpatient Medications:    acetaminophen  (TYLENOL ) 500 MG tablet, Take 2 tablets (1,000 mg total) by mouth 2 (two) times daily., Disp: 30 tablet, Rfl: 0   amLODipine  (NORVASC ) 5 MG tablet, Take 1 tablet by mouth once daily, Disp: 90 tablet, Rfl: 3   Bisacodyl (LAXATIVE PO), Take by mouth. Generic OTC PRN, Disp: , Rfl:    buPROPion  (WELLBUTRIN  XL) 300 MG 24 hr tablet, Take 1 tablet by mouth once daily, Disp: 90 tablet, Rfl: 0   CALCIUM  CITRATE PO, Take 1,200 mg by mouth daily., Disp: , Rfl:    cholecalciferol (VITAMIN D3) 25 MCG (1000 UNIT) tablet, Take 1,000 Units by mouth daily., Disp: , Rfl:    ezetimibe  (ZETIA ) 10 MG tablet, Take 1 tablet by mouth once daily, Disp: 90 tablet, Rfl: 3   famotidine  (PEPCID ) 40 MG tablet, Take 1 tablet (40 mg total) by mouth at bedtime., Disp: 90 tablet, Rfl: 3   glucose blood (ONETOUCH ULTRA) test strip, Test Blood Sugar twice daily., Disp: 100 each, Rfl: 12   irbesartan  (AVAPRO ) 300 MG tablet, Take 1 tablet (300 mg total) by mouth daily., Disp: 90 tablet, Rfl: 1   meloxicam  (MOBIC ) 15 MG tablet, Take 1 tablet by mouth once daily, Disp: 30 tablet, Rfl: 0   Multiple Vitamins-Minerals (MULTIVITAMIN ADULTS 50+ PO), Take 1 tablet by mouth daily., Disp: , Rfl:    mupirocin  ointment (BACTROBAN ) 2 %, Apply 1 Application topically 2 (two) times daily., Disp: 22 g, Rfl: 3   NON FORMULARY, CPAP  @@ 12 cm H20, Disp: , Rfl:    nystatin  cream (MYCOSTATIN ), Apply to affected area 2 times daily, Disp: 30 g, Rfl: 1   nystatin -triamcinolone  ointment (MYCOLOG), Apply 1 Application topically 2 (two) times daily., Disp: 30 g, Rfl: 0   Omega-3 Fatty Acids (FISH OIL) 1000 MG CAPS, Take 1 capsule by mouth daily., Disp: , Rfl:    OneTouch Delica Lancets 33G MISC, Inject 1 each into the skin daily., Disp: 100 each, Rfl: 0   REFRESH 1.4-0.6 % SOLN, Place 1 drop into both eyes as needed., Disp: , Rfl:    cyclobenzaprine  (FLEXERIL ) 5 MG tablet, Take 1 tablet (5 mg total) by mouth 3 (three) times daily as needed for muscle spasms. (Patient not taking: Reported on 09/26/2023), Disp: 90 tablet, Rfl: 3  Imaging Review  MR SHOULDER RIGHT WO CONTRAST  Narrative CLINICAL DATA:  Right shoulder lump for 1 year, increasing in size  EXAM: MRI OF THE RIGHT SHOULDER WITHOUT CONTRAST  TECHNIQUE: Multiplanar, multisequence MR imaging of the shoulder was performed. No intravenous contrast was administered.  COMPARISON:  X-ray 11/24/2020  FINDINGS: Rotator cuff: High-grade bursal sided tear of the anterior aspect of the distal supraspinatus tendon involving up to 75% of the tendon depth in the region of the critical zone. Tear measures approximately 14  mm in AP dimension. Background of severe supraspinatus and infraspinatus tendinosis. Moderate-severe subscapularis tendinosis without discrete tear. Teres minor intact.  Muscles:  No significant rotator cuff muscle atrophy.  Biceps long head: Nonvisualization of the long head biceps tendon, likely torn and retracted.  Acromioclavicular Joint: Severe arthropathy of the [facility] joint with bulky marginal osteophytes and prominent degenerative subchondral cysts. There are multiple periarticular ganglion cysts, the largest of which is located superior to the [facility] joint and measures 2.3 x 1.3 x 2.0 cm. Small volume subacromial-subdeltoid bursal fluid.  Glenohumeral  Joint: Diffuse chondral thinning and surface irregularity. No joint effusion.  Labrum:  Superior labrum appears degenerated.  Bones: Degenerative subchondral marrow signal changes at the [facility] joint. Osteophytic changes at the lesser tuberosity. No acute fracture or dislocation. No suspicious marrow replacing bone lesion.  Other: No solid mass within the soft tissues.  IMPRESSION: 1. Severe AC joint arthropathy with multiple periarticular ganglion cysts, the largest of which is located superior to the [facility] joint and measures up to 2.3 cm. This finding most likely accounts for patient's palpable abnormality. 2. High-grade bursal sided tear of the anterior aspect of the distal supraspinatus tendon involving up to 75% of the tendon depth in the region of the critical zone. Background of severe supraspinatus and infraspinatus tendinosis. 3. Moderate-severe subscapularis tendinosis without discrete tear. 4. Nonvisualization of the long head biceps tendon, likely torn and retracted.   Electronically Signed By: Leverne Reading D.O. On: 12/22/2020 16:08  DG Shoulder Right  Narrative CLINICAL DATA:  Swelling right acromioclavicular joint area for 3 years, increasing over last 6 months  EXAM: RIGHT SHOULDER - 2+ VIEW  COMPARISON:  None.  FINDINGS: Frontal, transscapular, and axillary views of the right shoulder are obtained. No fracture, subluxation, or dislocation. There is marked narrowing of the acromial humeral interval consistent with chronic longstanding rotator cuff tear. Significant hypertrophic changes are seen of the acromioclavicular joint, which may account for the area of swelling. Prominent spurring of the acromion process. Mild glenohumeral joint space narrowing. Right chest is clear.  IMPRESSION: 1. Significant hypertrophic changes of the acromioclavicular joint, which may account for the palpable abnormality. 2. Narrowing of the acromial humeral interval and  spurring of the undersurface of the acromion process, compatible with chronic rotator cuff tear. 3. Mild glenohumeral joint space narrowing.   Electronically Signed By: Bobbye Burrow M.D. On: 11/24/2020 21:34   MR LUMBAR SPINE W WO CONTRAST  Narrative CLINICAL DATA:  Spondyloarthropathy, lumbar spine. Chronic low back pain worsening over the last 3 months.  EXAM: MRI LUMBAR SPINE WITHOUT AND WITH CONTRAST  TECHNIQUE: Multiplanar and multiecho pulse sequences of the lumbar spine were obtained without and with intravenous contrast.  CONTRAST:  7.5mL GADAVIST  GADOBUTROL  1 MMOL/ML IV SOLN  COMPARISON:  Radiography 08/22/2023  FINDINGS: Segmentation:  5 lumbar type vertebral bodies.  Alignment:  Degenerative anterolisthesis at L4-5 of 5-6 mm.  Vertebrae: No fracture or focal bone lesion. No edematous endplate marrow changes. Some edema associated with the facet arthritis at L4-5.  Conus medullaris and cauda equina: Conus extends to the L1-2 level. Conus and cauda equina appear normal.  Paraspinal and other soft tissues: Negative  Disc levels:  Mild non-compressive disc bulges at T12-L1 and L1-2. Mild facet and ligamentous hypertrophy at L1-2 but no compressive stenosis.  L2-3: Moderate bulging of the disc. Facet and ligamentous hypertrophy. Mild multifactorial stenosis but without evidence of focal neural compression.  L3-4: Mild bulging of the disc. Mild facet and ligamentous  hypertrophy. Mild stenosis of both lateral recesses but no definite neural compression.  L4-5: Chronic facet arthropathy with anterolisthesis of 5-6 mm. Bulging of the disc with biforaminal protrusions. Severe multifactorial stenosis at the disc level. Bilateral foraminal stenosis could compress either or both L4 nerves. The facet arthritis would likely be painful.  L5-S1: Endplate osteophytes and bulging of the disc. Mild facet degeneration. No central canal stenosis. Mild proximal  foraminal stenosis but without definite compression of the exiting L5 nerves.  IMPRESSION: 1. 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 Bulging of the disc with biforaminal protrusions. Severe multifactorial stenosis at the disc level. Bilateral foraminal stenosis could compress either or both L4 nerves. The facet arthritis would likely be painful. 2. L2-3: Disc bulge. Facet and ligamentous hypertrophy. Mild multifactorial stenosis but without evidence of focal neural compression. 3. L3-4: Disc bulge. Facet and ligamentous hypertrophy. Mild stenosis of both lateral recesses but no definite neural compression. 4. L5-S1: Disc bulge. Mild facet degeneration. Mild proximal foraminal stenosis but without definite compression of the exiting L5 nerves.   Electronically Signed By: Bettylou Brunner M.D. On: 09/07/2023 16:12   DG Lumbar Spine Complete  Narrative CLINICAL DATA:  Lumbar radiculopathy. Right-sided back pain radiating to the right leg  EXAM: LUMBAR SPINE - COMPLETE 4+ VIEW  COMPARISON:  Radiograph 08/22/2023.  MRI 09/02/2023  FINDINGS: Five non-rib-bearing lumbar vertebra. 8 mm anterolisthesis of L4 on L5 is unchanged on flexion and extension. No evidence of acute fracture or compression deformity. Multilevel spondylosis with degenerative disc disease and facet hypertrophy.  IMPRESSION: 1. Grade 1 anterolisthesis of L4 on L5 without evidence of instability. 2. Multilevel spondylosis.   Electronically Signed By: Chadwick Colonel M.D. On: 09/20/2023 13:13  DG Hip Unilat W OR W/O Pelvis 2-3 Views Left  Narrative CLINICAL DATA:  Left groin pain.  EXAM: DG HIP (WITH OR WITHOUT PELVIS) 2-3V LEFT  COMPARISON:  None.  FINDINGS: There is moderate bilateral hip osteoarthritis. There is osteopenia which limits detection of nondisplaced fractures. There is no acute displaced fracture. There are calcifications projecting about the left hip  which may represent myositis ossificans versus multiple intra-articular loose bodies.  IMPRESSION: Moderate bilateral hip osteoarthritis. No acute displaced fracture.   Electronically Signed By: Laird Pih M.D. On: 07/25/2020 14:01   DG Hand Complete Right  Narrative CLINICAL DATA:  Fall, pain.  EXAM: RIGHT HAND - COMPLETE 3 VIEW  COMPARISON:  None Available.  FINDINGS: There is no evidence of fracture or dislocation. Osteoarthritis identified of the distal interphalangeal joints and the interphalangeal joint of the thumb. First metacarpal phalangeal degenerative changes as well. No osteolytic or osteoblastic lesions. No subluxations.  IMPRESSION: Multifocal osteoarthritis.  No acute osseous abnormalities.   Electronically Signed By: Sydell Eva M.D. On: 02/24/2023 18:40   Complexity Note: Imaging results reviewed.                         ROS  Cardiovascular: High blood pressure Pulmonary or Respiratory: Snoring  and Temporary stoppage of breathing during sleep Neurological: No reported neurological signs or symptoms such as seizures, abnormal skin sensations, urinary and/or fecal incontinence, being born with an abnormal open spine and/or a tethered spinal cord Psychological-Psychiatric: No reported psychological or psychiatric signs or symptoms such as difficulty sleeping, anxiety, depression, delusions or hallucinations (schizophrenial), mood swings (bipolar disorders) or suicidal ideations or attempts Gastrointestinal: Reflux or heatburn Genitourinary: No reported renal or genitourinary signs or symptoms such as  difficulty voiding or producing urine, peeing blood, non-functioning kidney, kidney stones, difficulty emptying the bladder, difficulty controlling the flow of urine, or chronic kidney disease Hematological: No reported hematological signs or symptoms such as prolonged bleeding, low or poor functioning platelets, bruising or bleeding easily,  hereditary bleeding problems, low energy levels due to low hemoglobin or being anemic Endocrine: High blood sugar controlled without the use of insulin (NIDDM) Rheumatologic: No reported rheumatological signs and symptoms such as fatigue, joint pain, tenderness, swelling, redness, heat, stiffness, decreased range of motion, with or without associated rash Musculoskeletal: Negative for myasthenia gravis, muscular dystrophy, multiple sclerosis or malignant hyperthermia Work History: Disabled  Allergies  Molly Ruiz is allergic to atorvastatin  and influenza vaccines.  Laboratory Chemistry Profile   Renal Lab Results  Component Value Date   BUN 15 03/13/2023   CREATININE 0.95 03/13/2023   BCR 16 03/13/2023   GFRAA 63 11/25/2019   GFRNONAA 55 (L) 11/25/2019   SPECGRAV 1.010 10/23/2020   PHUR 6.0 10/23/2020   PROTEINUR Negative 10/23/2020     Electrolytes Lab Results  Component Value Date   NA 141 03/13/2023   K 4.4 03/13/2023   CL 103 03/13/2023   CALCIUM  10.2 03/13/2023     Hepatic Lab Results  Component Value Date   AST 21 11/08/2022   ALT 13 11/08/2022   ALBUMIN 4.2 11/08/2022   ALKPHOS 123 (H) 11/08/2022     ID Lab Results  Component Value Date   SARSCOV2NAA NEGATIVE 07/18/2019     Bone Lab Results  Component Value Date   VD25OH 37.3 11/02/2021     Endocrine Lab Results  Component Value Date   GLUCOSE 147 (H) 03/13/2023   GLUCOSEU NEGATIVE 06/14/2016   HGBA1C 6.4 (A) 07/14/2023   TSH 1.420 11/08/2022     Neuropathy Lab Results  Component Value Date   VITAMINB12 399 06/24/2016   HGBA1C 6.4 (A) 07/14/2023     CNS No results found for: "COLORCSF", "APPEARCSF", "RBCCOUNTCSF", "WBCCSF", "POLYSCSF", "LYMPHSCSF", "EOSCSF", "PROTEINCSF", "GLUCCSF", "JCVIRUS", "CSFOLI", "IGGCSF", "LABACHR", "ACETBL"   Inflammation (CRP: Acute  ESR: Chronic) No results found for: "CRP", "ESRSEDRATE", "LATICACIDVEN"   Rheumatology No results found for: "RF", "ANA",  "LABURIC", "URICUR", "LYMEIGGIGMAB", "LYMEABIGMQN", "HLAB27"   Coagulation Lab Results  Component Value Date   PLT 216 11/08/2022     Cardiovascular Lab Results  Component Value Date   HGB 13.0 11/08/2022   HCT 39.1 11/08/2022     Screening Lab Results  Component Value Date   SARSCOV2NAA NEGATIVE 07/18/2019     Cancer No results found for: "CEA", "CA125", "LABCA2"   Allergens No results found for: "ALMOND", "APPLE", "ASPARAGUS", "AVOCADO", "BANANA", "BARLEY", "BASIL", "BAYLEAF", "GREENBEAN", "LIMABEAN", "WHITEBEAN", "BEEFIGE", "REDBEET", "BLUEBERRY", "BROCCOLI", "CABBAGE", "MELON", "CARROT", "CASEIN", "CASHEWNUT", "CAULIFLOWER", "CELERY"     Note: Lab results reviewed.  PFSH  Drug: Molly Ruiz  reports no history of drug use. Alcohol:  reports no history of alcohol use. Tobacco:  reports that she has never smoked. She has never used smokeless tobacco. Medical:  has a past medical history of Complication of anesthesia, Depression, Diabetes mellitus without complication (HCC), Hyperlipidemia, Hypertension, Sleep apnea, and Wears dentures. Family: family history includes Breast cancer (age of onset: 66) in an other family member; Diabetes in her mother; Heart disease in her sister; Prostate cancer in her brother; Stroke in her brother and mother.  Past Surgical History:  Procedure Laterality Date   ABDOMINAL HYSTERECTOMY     BILATERAL CARPAL TUNNEL RELEASE     CATARACT EXTRACTION  W/PHACO Right 11/12/2018   Procedure: CATARACT EXTRACTION PHACO AND INTRAOCULAR LENS PLACEMENT (IOC)  RIGHT DIABETIC;  Surgeon: Rosa College, MD;  Location: [facility];  Service: Ophthalmology;  Laterality: Right;  Diabetic - diet controlled sleep apnea   CERVICAL FUSION  2004   COLONOSCOPY WITH PROPOFOL  N/A 07/22/2019   Procedure: COLONOSCOPY WITH BIOPSIES;  Surgeon: Marnee Sink, MD;  Location: [facility];  Service: Endoscopy;  Laterality: N/A;  Diabetic - diet  controlled priority 4   FOOT SURGERY     HAND SURGERY Left 06/2022   palmar cyst removed   LUMBAR DISC SURGERY  1998   POLYPECTOMY N/A 07/22/2019   Procedure: POLYPECTOMY;  Surgeon: Marnee Sink, MD;  Location: [facility];  Service: Endoscopy;  Laterality: N/A;   Active Ambulatory Problems    Diagnosis Date Noted   Type II diabetes mellitus with complication (HCC) 06/24/2016   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   Hyperlipidemia associated with type 2 diabetes mellitus (HCC) 06/24/2016   Obesity (BMI 30.0-34.9) 06/24/2016   Vitamin D  deficiency 06/24/2016   History of shingles 06/24/2016   Allergic rhinitis 06/24/2016   OSA on CPAP 01/02/2017   Primary osteoarthritis of left hip 01/12/2017   Slow transit constipation 02/06/2017   Eczema 10/25/2017   Essential hypertension 04/26/2018   Osteopenia determined by x-ray 06/06/2018   Anxiety disorder 06/13/2019   Special screening for malignant neoplasms, colon    Tubular adenoma of colon    Gastroesophageal reflux disease 09/19/2019   Statin myopathy 03/31/2020   Acute pain of right shoulder 09/24/2020   Stage 3a chronic kidney disease (HCC) 11/29/2021   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 03/09/2022   Acute right-sided low back pain with right-sided sciatica 08/25/2023   Lumbar radiculopathy 09/26/2023   Foraminal stenosis of lumbar region 09/26/2023   Spinal stenosis, lumbar region, with neurogenic claudication 09/26/2023   Resolved Ambulatory Problems    Diagnosis Date Noted   Need for pneumococcal vaccination 06/24/2016   Need for diphtheria-tetanus-pertussis (Tdap) vaccine 06/24/2016   Current use of proton pump inhibitor 06/24/2016   Past Medical History:  Diagnosis Date   Complication of anesthesia    Depression    Diabetes mellitus without complication (HCC)    Hyperlipidemia    Hypertension    Sleep apnea    Wears dentures    Constitutional Exam  General appearance: Well  nourished, well developed, and well hydrated. In no apparent acute distress Vitals:   09/26/23 0936  BP: (!) 164/70  Pulse: 74  Resp: 16  Temp: (!) 97.3 F (36.3 C)  TempSrc: Temporal  SpO2: 100%  Weight: 187 lb (84.8 kg)  Height: 5' 6.5" (1.689 m)   BMI Assessment: Estimated body mass index is 29.73 kg/m as calculated from the following:   Height as of this encounter: 5' 6.5" (1.689 m).   Weight as of this encounter: 187 lb (84.8 kg).  BMI interpretation table: BMI level Category Range association with higher incidence of chronic pain  <18 kg/m2 Underweight   18.5-24.9 kg/m2 Ideal body weight   25-29.9 kg/m2 Overweight Increased incidence by 20%  30-34.9 kg/m2 Obese (Class I) Increased incidence by 68%  35-39.9 kg/m2 Severe obesity (Class II) Increased incidence by 136%  >40 kg/m2 Extreme obesity (Class III) Increased incidence by 254%   Patient's current BMI Ideal Body weight  Body mass index is 29.73 kg/m. Ideal body weight: 60.5 kg (133 lb 4.3 oz) Adjusted ideal body weight: 70.2  kg (154 lb 12.2 oz)   BMI Readings from Last 4 Encounters:  09/26/23 29.73 kg/m  09/11/23 29.29 kg/m  09/07/23 29.34 kg/m  08/25/23 29.34 kg/m   Wt Readings from Last 4 Encounters:  09/26/23 187 lb (84.8 kg)  09/11/23 187 lb (84.8 kg)  08/25/23 187 lb 5 oz (85 kg)  08/18/23 192 lb 8 oz (87.3 kg)    Psych/Mental status: Alert, oriented x 3 (person, place, & time)       Eyes: PERLA Respiratory: No evidence of acute respiratory distress  Lumbar Spine Area Exam  Skin & Axial Inspection: No masses, redness, or swelling Alignment: Symmetrical Functional ROM: Pain restricted ROM affecting both sides Stability: No instability detected Muscle Tone/Strength: Functionally intact. No obvious neuro-muscular anomalies detected. Sensory (Neurological): Dermatomal pain pattern R>L Palpation: No palpable anomalies       Provocative Tests:  Lateral bending test: (+) ipsilateral radicular  pain, on the right. Positive for right-sided foraminal stenosis.  Gait & Posture Assessment  Ambulation: Patient came in today in a wheel chair Gait: Antalgic gait (limping) Posture: Difficulty standing up straight, due to pain  Lower Extremity Exam    Side: Right lower extremity  Side: Left lower extremity  Stability: No instability observed          Stability: No instability observed          Skin & Extremity Inspection: Skin color, temperature, and hair growth are WNL. No peripheral edema or cyanosis. No masses, redness, swelling, asymmetry, or associated skin lesions. No contractures.  Skin & Extremity Inspection: Skin color, temperature, and hair growth are WNL. No peripheral edema or cyanosis. No masses, redness, swelling, asymmetry, or associated skin lesions. No contractures.  Functional ROM: Pain restricted ROM for hip and knee joints          Functional ROM: Unrestricted ROM                  Muscle Tone/Strength: Functionally intact. No obvious neuro-muscular anomalies detected.  Muscle Tone/Strength: Functionally intact. No obvious neuro-muscular anomalies detected.  Sensory (Neurological): Dermatomal pain pattern        Sensory (Neurological): Unimpaired        DTR: Patellar: deferred today Achilles: deferred today Plantar: deferred today  DTR: Patellar: deferred today Achilles: deferred today Plantar: deferred today  Palpation: No palpable anomalies  Palpation: No palpable anomalies    Assessment  Primary Diagnosis & Pertinent Problem List: Diagnoses of Lumbar radiculopathy, Foraminal stenosis of lumbar region, and Spinal stenosis, lumbar region, with neurogenic claudication were pertinent to this visit.  Visit Diagnosis (New problems to examiner): 1. Lumbar radiculopathy   2. Foraminal stenosis of lumbar region   3. 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 She had back surgery in 1999 for severe stenosis at L4, L5.  Recent MRI shows chronic facet arthropathy with anterolisthesis of 5 to 6 mm.  There are severe multifactorial stenosis at L4-L5 and severe bilateral foraminal stenosis compressing both L4 nerves.  She also has severe facet arthritis along    Procedure Orders         Lumbar Transforaminal Epidural    Provider-requested follow-up: Return in about 2 weeks (around 10/10/2023) for B/L L4  TF ESI, in clinic (PO Valium 5mg ).  Future Appointments  Date Time Provider Department Center  11/13/2023  2:20 PM Sheron Dixons, MD [facility]  07/10/2024  2:40 PM [facility]M-ANNUAL WELLNESS VISIT [facility]   I discussed the assessment and treatment plan with the patient. The patient was provided an opportunity to ask questions and all were answered. The patient agreed with the plan and demonstrated an understanding of the instructions.  Patient advised to call back or seek an in-person evaluation if the symptoms or condition worsens.  Duration of encounter: .  Total time on encounter, as per AMA guidelines included both the face-to-face and non-face-to-face time personally spent by the physician and/or other qualified health care professional(s) on the day of the encounter (includes time in activities that require the physician or other qualified health care professional and does not include time in activities normally performed by clinical staff). Physician's time may include the following activities when performed: Preparing to see the patient (e.g., pre-charting review of records, searching for previously ordered imaging, lab work, and nerve conduction tests) Review of prior analgesic pharmacotherapies. Reviewing PMP Interpreting ordered tests  (e.g., lab work, imaging, nerve conduction tests) Performing post-procedure evaluations, including interpretation of diagnostic procedures Obtaining and/or reviewing separately obtained history Performing a medically appropriate examination and/or evaluation Counseling and educating the patient/family/caregiver Ordering medications, tests, or procedures Referring and communicating with other health care professionals (when not separately reported) Documenting clinical information in the electronic or other health record Independently interpreting results (not separately reported) and communicating results to the patient/ family/caregiver Care coordination (not separately reported)  Note by: Cephus Collin, MD (TTS and AI technology used. I apologize for any typographical errors that were not detected and corrected.) Date: 09/26/2023; Time: 10:57 AM

## 2023-09-26 NOTE — Patient Instructions (Signed)
GENERAL RISKS AND COMPLICATIONS ° °What are the risk, side effects and possible complications? °Generally speaking, most procedures are safe.  However, with any procedure there are risks, side effects, and the possibility of complications.  The risks and complications are dependent upon the sites that are lesioned, or the type of nerve block to be performed.  The closer the procedure is to the spine, the more serious the risks are.  Great care is taken when placing the radio frequency needles, block needles or lesioning probes, but sometimes complications can occur. °Infection: Any time there is an injection through the skin, there is a risk of infection.  This is why sterile conditions are used for these blocks.  There are four possible types of infection. °Localized skin infection. °Central Nervous System Infection-This can be in the form of Meningitis, which can be deadly. °Epidural Infections-This can be in the form of an epidural abscess, which can cause pressure inside of the spine, causing compression of the spinal cord with subsequent paralysis. This would require an emergency surgery to decompress, and there are no guarantees that the patient would recover from the paralysis. °Discitis-This is an infection of the intervertebral discs.  It occurs in about 1% of discography procedures.  It is difficult to treat and it may lead to surgery. ° °      2. Pain: the needles have to go through skin and soft tissues, will cause soreness. °      3. Damage to internal structures:  The nerves to be lesioned may be near blood vessels or   ° other nerves which can be potentially damaged. °      4. Bleeding: Bleeding is more common if the patient is taking blood thinners such as  aspirin, Coumadin, Ticiid, Plavix, etc., or if he/she have some genetic predisposition  such as hemophilia. Bleeding into the spinal canal can cause compression of the spinal  cord with subsequent paralysis.  This would require an emergency  surgery to  decompress and there are no guarantees that the patient would recover from the  paralysis. °      5. Pneumothorax:  Puncturing of a lung is a possibility, every time a needle is introduced in  the area of the chest or upper back.  Pneumothorax refers to free air around the  collapsed lung(s), inside of the thoracic cavity (chest cavity).  Another two possible  complications related to a similar event would include: Hemothorax and Chylothorax.   These are variations of the Pneumothorax, where instead of air around the collapsed  lung(s), you may have blood or chyle, respectively. °      6. Spinal headaches: They may occur with any procedures in the area of the spine. °      7. Persistent CSF (Cerebro-Spinal Fluid) leakage: This is a rare problem, but may occur  with prolonged intrathecal or epidural catheters either due to the formation of a fistulous  track or a dural tear. °      8. Nerve damage: By working so close to the spinal cord, there is always a possibility of  nerve damage, which could be as serious as a permanent spinal cord injury with  paralysis. °      9. Death:  Although rare, severe deadly allergic reactions known as "Anaphylactic  reaction" can occur to any of the medications used. °     10. Worsening of the symptoms:  We can always make thing worse. ° °What are the chances   of something like this happening? Chances of any of this occuring are extremely low.  By statistics, you have more of a chance of getting killed in a motor vehicle accident: while driving to the hospital than any of the above occurring .  Nevertheless, you should be aware that they are possibilities.  In general, it is similar to taking a shower.  Everybody knows that you can slip, hit your head and get killed.  Does that mean that you should not shower again?  Nevertheless always keep in mind that statistics do not mean anything if you happen to be on the wrong side of them.  Even if a procedure has a 1 (one) in a  1,000,000 (million) chance of going wrong, it you happen to be that one..Also, keep in mind that by statistics, you have more of a chance of having something go wrong when taking medications.  Who should not have this procedure? If you are on a blood thinning medication (e.g. Coumadin, Plavix, see list of "Blood Thinners"), or if you have an active infection going on, you should not have the procedure.  If you are taking any blood thinners, please inform your physician.  How should I prepare for this procedure? Do not eat or drink anything at least six hours prior to the procedure. Bring a driver with you .  It cannot be a taxi. Come accompanied by an adult that can drive you back, and that is strong enough to help you if your legs get weak or numb from the local anesthetic. Take all of your medicines the morning of the procedure with just enough water to swallow them. If you have diabetes, make sure that you are scheduled to have your procedure done first thing in the morning, whenever possible. If you have diabetes, take only half of your insulin dose and notify our nurse that you have done so as soon as you arrive at the clinic. If you are diabetic, but only take blood sugar pills (oral hypoglycemic), then do not take them on the morning of your procedure.  You may take them after you have had the procedure. Do not take aspirin or any aspirin-containing medications, at least eleven (11) days prior to the procedure.  They may prolong bleeding. Wear loose fitting clothing that may be easy to take off and that you would not mind if it got stained with Betadine or blood. Do not wear any jewelry or perfume Remove any nail coloring.  It will interfere with some of our monitoring equipment.  NOTE: Remember that this is not meant to be interpreted as a complete list of all possible complications.  Unforeseen problems may occur.  BLOOD THINNERS The following drugs contain aspirin or other products,  which can cause increased bleeding during surgery and should not be taken for 2 weeks prior to and 1 week after surgery.  If you should need take something for relief of minor pain, you may take acetaminophen which is found in Tylenol,m Datril, Anacin-3 and Panadol. It is not blood thinner. The products listed below are.  Do not take any of the products listed below in addition to any listed on your instruction sheet.  A.P.C or A.P.C with Codeine Codeine Phosphate Capsules #3 Ibuprofen Ridaura  ABC compound Congesprin Imuran rimadil  Advil Cope Indocin Robaxisal  Alka-Seltzer Effervescent Pain Reliever and Antacid Coricidin or Coricidin-D  Indomethacin Rufen  Alka-Seltzer plus Cold Medicine Cosprin Ketoprofen S-A-C Tablets  Anacin Analgesic Tablets or Capsules Coumadin   Korlgesic Salflex  Anacin Extra Strength Analgesic tablets or capsules CP-2 Tablets Lanoril Salicylate  Anaprox Cuprimine Capsules Levenox Salocol  Anexsia-D Dalteparin Magan Salsalate  Anodynos Darvon compound Magnesium Salicylate Sine-off  Ansaid Dasin Capsules Magsal Sodium Salicylate  Anturane Depen Capsules Marnal Soma  APF Arthritis pain formula Dewitt's Pills Measurin Stanback  Argesic Dia-Gesic Meclofenamic Sulfinpyrazone  Arthritis Bayer Timed Release Aspirin Diclofenac Meclomen Sulindac  Arthritis pain formula Anacin Dicumarol Medipren Supac  Analgesic (Safety coated) Arthralgen Diffunasal Mefanamic Suprofen  Arthritis Strength Bufferin Dihydrocodeine Mepro Compound Suprol  Arthropan liquid Dopirydamole Methcarbomol with Aspirin Synalgos  ASA tablets/Enseals Disalcid Micrainin Tagament  Ascriptin Doan's Midol Talwin  Ascriptin A/D Dolene Mobidin Tanderil  Ascriptin Extra Strength Dolobid Moblgesic Ticlid  Ascriptin with Codeine Doloprin or Doloprin with Codeine Momentum Tolectin  Asperbuf Duoprin Mono-gesic Trendar  Aspergum Duradyne Motrin or Motrin IB Triminicin  Aspirin plain, buffered or enteric coated  Durasal Myochrisine Trigesic  Aspirin Suppositories Easprin Nalfon Trillsate  Aspirin with Codeine Ecotrin Regular or Extra Strength Naprosyn Uracel  Atromid-S Efficin Naproxen Ursinus  Auranofin Capsules Elmiron Neocylate Vanquish  Axotal Emagrin Norgesic Verin  Azathioprine Empirin or Empirin with Codeine Normiflo Vitamin E  Azolid Emprazil Nuprin Voltaren  Bayer Aspirin plain, buffered or children's or timed BC Tablets or powders Encaprin Orgaran Warfarin Sodium  Buff-a-Comp Enoxaparin Orudis Zorpin  Buff-a-Comp with Codeine Equegesic Os-Cal-Gesic   Buffaprin Excedrin plain, buffered or Extra Strength Oxalid   Bufferin Arthritis Strength Feldene Oxphenbutazone   Bufferin plain or Extra Strength Feldene Capsules Oxycodone with Aspirin   Bufferin with Codeine Fenoprofen Fenoprofen Pabalate or Pabalate-SF   Buffets II Flogesic Panagesic   Buffinol plain or Extra Strength Florinal or Florinal with Codeine Panwarfarin   Buf-Tabs Flurbiprofen Penicillamine   Butalbital Compound Four-way cold tablets Penicillin   Butazolidin Fragmin Pepto-Bismol   Carbenicillin Geminisyn Percodan   Carna Arthritis Reliever Geopen Persantine   Carprofen Gold's salt Persistin   Chloramphenicol Goody's Phenylbutazone   Chloromycetin Haltrain Piroxlcam   Clmetidine heparin Plaquenil   Cllnoril Hyco-pap Ponstel   Clofibrate Hydroxy chloroquine Propoxyphen         Before stopping any of these medications, be sure to consult the physician who ordered them.  Some, such as Coumadin (Warfarin) are ordered to prevent or treat serious conditions such as "deep thrombosis", "pumonary embolisms", and other heart problems.  The amount of time that you may need off of the medication may also vary with the medication and the reason for which you were taking it.  If you are taking any of these medications, please make sure you notify your pain physician before you undergo any procedures.         Moderate Conscious  Sedation, Adult Sedation is the use of medicines to help you relax and not feel pain. Moderate conscious sedation is a type of sedation that makes you less alert than normal. You are still able to respond to instructions, touch, or both. This type of sedation is used during short medical and dental procedures. It is milder than deep sedation, which is a type of sedation you cannot be easily woken up from. It is also milder than general anesthesia, which is the use of medicines to make you fall asleep. Moderate conscious sedation lets you return to your normal activities sooner. Tell a health care provider about: Any allergies you have. All medicines you are taking, including vitamins, herbs, steroids, eye drops, creams, and over-the-counter medicines. Any problems you or family members have had with anesthesia.  Any bleeding problems you have. Any surgeries you have had. Any medical conditions you have. Whether you are pregnant or may be pregnant. Any recent alcohol, tobacco, or drug use. What are the risks? Your health care provider will talk with you about risks. These may include: Oversedation. This is when you get too much medicine. Nausea or vomiting. Allergic reaction to medicines. Trouble breathing. If this happens, a breathing tube may be used. It will be removed when you can breathe better on your own. Heart trouble. Lung trouble. Emergence delirium. This is when you feel confused while the sedation wears off. This gets better with time. What happens before the procedure? When to stop eating and drinking Follow instructions from your health care provider about what you may eat and drink. These may include: 8 hours before your procedure Stop eating most foods. Do not eat meat, fried foods, or fatty foods. Eat only light foods, such as toast or crackers. All liquids are okay except energy drinks and alcohol. 6 hours before your procedure Stop eating. Drink only clear liquids, such  as water, clear fruit juice, black coffee, plain tea, and sports drinks. Do not drink energy drinks or alcohol. 2 hours before your procedure Stop drinking all liquids. You may be allowed to take medicines with small sips of water. If you do not follow your health care provider's instructions, your procedure may be delayed or canceled. Medicines Ask your health care provider about: Changing or stopping your regular medicines. These include any diabetes medicines or blood thinners you take. Taking medicines such as aspirin and ibuprofen. These medicines can thin your blood. Do not take them unless your health care provider tells you to. Taking over-the-counter medicines, vitamins, herbs, and supplements. Tests and exams You may have an exam or testing. You may have a blood or urine sample taken. General instructions Do not use any products that contain nicotine or tobacco for at least 4 weeks before the procedure. These products include cigarettes, chewing tobacco, and vaping devices, such as e-cigarettes. If you need help quitting, ask your health care provider. If you will be going home right after the procedure, plan to have a responsible adult: Take you home from the hospital or clinic. You will not be allowed to drive. Care for you for the time you are told. What happens during the procedure?  You will be given the sedative. It may be given: As a pill you can take by mouth. It can also be put into the rectum. As a spray through the nose. As an injection into muscle. As an injection into a vein through an IV. You may be given oxygen as needed. Your blood pressure, heart rate, breathing rate, and blood oxygen level will be monitored during the procedure. The medical or dental procedure will be done. The procedure may vary among health care providers and hospitals. What happens after the procedure? Your blood pressure, heart rate, breathing rate, and blood oxygen level will be  monitored until you leave the hospital or clinic. You will get fluids through an IV as needed. Do not drive or operate machinery until your health care provider says that it is safe. This information is not intended to replace advice given to you by your health care provider. Make sure you discuss any questions you have with your health care provider. Document Revised: 11/08/2021 Document Reviewed: 11/08/2021 Elsevier Patient Education  2024 [employer] Epidural Steroid Injection Patient Information  Description: The epidural space surrounds the nerves as  they exit the spinal cord.  In some patients, the nerves can be compressed and inflamed by a bulging disc or a tight spinal canal (spinal stenosis).  By injecting steroids into the epidural space, we can bring irritated nerves into direct contact with a potentially helpful medication.  These steroids act directly on the irritated nerves and can reduce swelling and inflammation which often leads to decreased pain.  Epidural steroids may be injected anywhere along the spine and from the neck to the low back depending upon the location of your pain.   After numbing the skin with local anesthetic (like Novocaine), a small needle is passed into the epidural space slowly.  You may experience a sensation of pressure while this is being done.  The entire block usually last less than 10 minutes.  Conditions which may be treated by epidural steroids:  Low back and leg pain Neck and arm pain Spinal stenosis Post-laminectomy syndrome Herpes zoster (shingles) pain Pain from compression fractures  Preparation for the injection:  Do not eat any solid food or dairy products within 8 hours of your appointment.  You may drink clear liquids up to 3 hours before appointment.  Clear liquids include water, black coffee, juice or soda.  No milk or cream please. You may take your regular medication, including pain medications, with a sip of water before your  appointment  Diabetics should hold regular insulin (if taken separately) and take 1/2 normal NPH dos the morning of the procedure.  Carry some sugar containing items with you to your appointment. A driver must accompany you and be prepared to drive you home after your procedure.  Bring all your current medications with your. An IV may be inserted and sedation may be given at the discretion of the physician.   A blood pressure cuff, EKG and other monitors will often be applied during the procedure.  Some patients may need to have extra oxygen administered for a short period. You will be asked to provide medical information, including your allergies, prior to the procedure.  We must know immediately if you are taking blood thinners (like Coumadin/Warfarin)  Or if you are allergic to IV iodine contrast (dye). We must know if you could possible be pregnant.  Possible side-effects: Bleeding from needle site Infection (rare, may require surgery) Nerve injury (rare) Numbness & tingling (temporary) Difficulty urinating (rare, temporary) Spinal headache ( a headache worse with upright posture) Light -headedness (temporary) Pain at injection site (several days) Decreased blood pressure (temporary) Weakness in arm/leg (temporary) Pressure sensation in back/neck (temporary)  Call if you experience: Fever/chills associated with headache or increased back/neck pain. Headache worsened by an upright position. New onset weakness or numbness of an extremity below the injection site Hives or difficulty breathing (go to the emergency room) Inflammation or drainage at the infection site Severe back/neck pain Any new symptoms which are concerning to you  Please note:  Although the local anesthetic injected can often make your back or neck feel good for several hours after the injection, the pain will likely return.  It takes 3-7 days for steroids to work in the epidural space.  You may not notice any pain  relief for at least that one week.  If effective, we will often do a series of three injections spaced 3-6 weeks apart to maximally decrease your pain.  After the initial series, we generally will wait several months before considering a repeat injection of the same type.  If you have any  questions, please call [phone] [facility]

## 2023-09-28 ENCOUNTER — Other Ambulatory Visit: Payer: Self-pay

## 2023-09-28 ENCOUNTER — Other Ambulatory Visit: Payer: Self-pay | Admitting: Internal Medicine

## 2023-09-28 ENCOUNTER — Emergency Department: Payer: Medicare (Managed Care)

## 2023-09-28 ENCOUNTER — Emergency Department
Admission: EM | Admit: 2023-09-28 | Discharge: 2023-09-28 | Disposition: A | Discharge status: Home / Self Care | Payer: Medicare (Managed Care)

## 2023-09-28 DIAGNOSIS — E1122 Type 2 diabetes mellitus with diabetic chronic kidney disease: Secondary | ICD-10-CM | POA: Diagnosis not present

## 2023-09-28 DIAGNOSIS — W07XXXA Fall from chair, initial encounter: Secondary | ICD-10-CM | POA: Diagnosis not present

## 2023-09-28 DIAGNOSIS — I129 Hypertensive chronic kidney disease with stage 1 through stage 4 chronic kidney disease, or unspecified chronic kidney disease: Secondary | ICD-10-CM | POA: Diagnosis not present

## 2023-09-28 DIAGNOSIS — W19XXXA Unspecified fall, initial encounter: Secondary | ICD-10-CM

## 2023-09-28 DIAGNOSIS — N189 Chronic kidney disease, unspecified: Secondary | ICD-10-CM | POA: Diagnosis not present

## 2023-09-28 DIAGNOSIS — S0003XA Contusion of scalp, initial encounter: Secondary | ICD-10-CM | POA: Diagnosis not present

## 2023-09-28 DIAGNOSIS — F419 Anxiety disorder, unspecified: Secondary | ICD-10-CM

## 2023-09-28 DIAGNOSIS — S0990XA Unspecified injury of head, initial encounter: Secondary | ICD-10-CM | POA: Diagnosis present

## 2023-09-28 LAB — CBC WITH DIFFERENTIAL/PLATELET
Abs Immature Granulocytes: 0.01 10*3/uL (ref 0.00–0.07)
Basophils Absolute: 0.1 10*3/uL (ref 0.0–0.1)
Basophils Relative: 1 %
Eosinophils Absolute: 0 10*3/uL (ref 0.0–0.5)
Eosinophils Relative: 1 %
HCT: 38.8 % (ref 36.0–46.0)
Hemoglobin: 13.1 g/dL (ref 12.0–15.0)
Immature Granulocytes: 0 %
Lymphocytes Relative: 38 %
Lymphs Abs: 1.8 10*3/uL (ref 0.7–4.0)
MCH: 31.5 pg (ref 26.0–34.0)
MCHC: 33.8 g/dL (ref 30.0–36.0)
MCV: 93.3 fL (ref 80.0–100.0)
Monocytes Absolute: 0.5 10*3/uL (ref 0.1–1.0)
Monocytes Relative: 10 %
Neutro Abs: 2.4 10*3/uL (ref 1.7–7.7)
Neutrophils Relative %: 50 %
Platelets: 244 10*3/uL (ref 150–400)
RBC: 4.16 MIL/uL (ref 3.87–5.11)
RDW: 13.7 % (ref 11.5–15.5)
WBC: 4.8 10*3/uL (ref 4.0–10.5)
nRBC: 0 % (ref 0.0–0.2)

## 2023-09-28 LAB — BASIC METABOLIC PANEL WITH GFR
Anion gap: 10 (ref 5–15)
BUN: 11 mg/dL (ref 8–23)
CO2: 26 mmol/L (ref 22–32)
Calcium: 9.7 mg/dL (ref 8.9–10.3)
Chloride: 102 mmol/L (ref 98–111)
Creatinine, Ser: 0.83 mg/dL (ref 0.44–1.00)
GFR, Estimated: >60 mL/min (ref >60)
Glucose, Bld: 125 mg/dL — ABNORMAL HIGH (ref 70–99)
Potassium: 4.4 mmol/L (ref 3.5–5.1)
Sodium: 138 mmol/L (ref 135–145)

## 2023-09-28 LAB — PROTIME-INR
INR: 1.1 (ref 0.8–1.2)
Prothrombin Time: 14 s (ref 11.4–15.2)

## 2023-09-28 MED ORDER — ACETAMINOPHEN 500 MG PO TABS
1000.0000 mg | ORAL_TABLET | Freq: Once | ORAL | Status: AC
  Administered 2023-09-28: 1000 mg via ORAL
  Filled 2023-09-28: qty 2

## 2023-09-28 NOTE — ED Provider Notes (Signed)
 [facility] Provider Note    Event Date/Time   First MD Initiated Contact with Patient 09/28/23 1350     (approximate)   History   Fall and Head Injury  Pt states that she was attempting to sit down in the chair but was over to far and missed the chair, pt fell into a seated position but hit the left side of her head on the closet door, pt denies being on blood thinners and denies neck pain, denies loc   HPI Molly Ruiz is a 83 y.o. female PMH DM2, hyperlipidemia, hypertension, CKD presents for evaluation after a fall - Patient was in the restroom attempting to sit down on a chair, missed the chair and fell backwards hitting her head on the wall.  No LOC.  Not on blood thinners.  Does have mild headache since then and has noticed a hematoma to her left occiput.  No neck pain. - Otherwise has been in her usual state of health.  No recent infectious symptoms.  No dizziness. - Last p.o. intake 8 AM - No focal weakness or numbness       Physical Exam   Triage Vital Signs: ED Triage Vitals  Encounter Vitals Group     BP 09/28/23 1240 (!) 160/73     Systolic BP Percentile --      Diastolic BP Percentile --      Pulse Rate 09/28/23 1240 88     Resp 09/28/23 1240 16     Temp 09/28/23 1240 97.8 F (36.6 C)     Temp Source 09/28/23 1240 Oral     SpO2 09/28/23 1240 100 %     Weight 09/28/23 1241 187 lb (84.8 kg)     Height 09/28/23 1241 5\' 6"  (1.676 m)     Head Circumference --      Peak Flow --      Pain Score 09/28/23 1241 1     Pain Loc --      Pain Education --      Exclude from Growth Chart --     Most recent vital signs: Vitals:   09/28/23 1240  BP: (!) 160/73  Pulse: 88  Resp: 16  Temp: 97.8 F (36.6 C)  SpO2: 100%     General: Awake, no distress.  HEENT: Small hematoma to left occiput, no lacerations Neck:  No midline neck pain, full range of motion CV:  Good peripheral perfusion. RRR, RP 2+ Resp:  Normal effort.  CTAB Abd:  No distention. Nontender to deep palpation throughout Pelvis:  stable Ext:  No tenderness or deformities throughout b/l upper and lower extremities   ED Results / Procedures / Treatments   Labs (all labs ordered are listed, but only abnormal results are displayed) Labs Reviewed  PROTIME-INR  CBC WITH DIFFERENTIAL/PLATELET  BASIC METABOLIC PANEL WITH GFR     EKG  N/a   RADIOLOGY Radiology pending.    PROCEDURES:  Critical Care performed: No  Procedures   MEDICATIONS ORDERED IN ED: Medications  acetaminophen  (TYLENOL ) tablet 1,000 mg (1,000 mg Oral Given 09/28/23 1511)     IMPRESSION / MDM / ASSESSMENT AND PLAN / ED COURSE  I reviewed the triage vital signs and the nursing notes.                              DDX/MDM/AP: Differential diagnosis includes, but is not limited to, mechanical fall with associated  head trauma.  No concern for underlying organic etiology of fall with very well-appearing patient and otherwise negative ROS.  Consider possibility of skull fracture or intracranial hemorrhage given external evidence of trauma and patient's advanced age--will screen CT head.  No clinical concern for C-spine injury at this time--several hours out from event with no concerning findings on exam.  Plan: - CT head - tylenol  - reassess  Patient's presentation is most consistent with acute presentation with potential threat to life or bodily function.   ED course below.  Patient signed out to oncoming ED provider pending CT head.  Stable for discharge home from my perspective.      FINAL CLINICAL IMPRESSION(S) / ED DIAGNOSES   Final diagnoses:  Fall, initial encounter  Contusion of scalp, initial encounter     Rx / DC Orders   ED Discharge Orders     None        Note:  This document was prepared using Dragon voice recognition software and may include unintentional dictation errors.   Collis Deaner, MD 09/28/23 [phone]

## 2023-09-28 NOTE — Discharge Instructions (Addendum)
 Your evaluation in the emergency department was reassuring, the CT imaging did not show any fracture or bleed.

## 2023-09-28 NOTE — ED Triage Notes (Signed)
 Pt states that she was attempting to sit down in the chair but was over to far and missed the chair, pt fell into a seated position but hit the left side of her head on the closet door, pt denies being on blood thinners and denies neck pain, denies loc

## 2023-09-29 NOTE — Telephone Encounter (Signed)
 Requested Prescriptions  Pending Prescriptions Disp Refills   buPROPion  (WELLBUTRIN  XL) 300 MG 24 hr tablet [Pharmacy Med Name: buPROPion  HCl ER (XL) 300 MG Oral Tablet Extended Release 24 Hour] 90 tablet 0    Sig: Take 1 tablet by mouth once daily     Psychiatry: Antidepressants - bupropion  Failed - 09/29/2023  6:07 PM      Failed - Last BP in normal range    BP Readings from Last 1 Encounters:  09/28/23 (!) 153/68         Passed - Cr in normal range and within 360 days    Creatinine, Ser  Date Value Ref Range Status  09/28/2023 0.83 0.44 - 1.00 mg/dL Final         Passed - AST in normal range and within 360 days    AST  Date Value Ref Range Status  11/08/2022 21 0 - 40 IU/L Final         Passed - ALT in normal range and within 360 days    ALT  Date Value Ref Range Status  11/08/2022 13 0 - 32 IU/L Final         Passed - Valid encounter within last 6 months    Recent Outpatient Visits           3 weeks ago Localized edema   [place] [facility] at [facility], Chales Colorado, MD   1 month ago Acute right-sided low back pain with right-sided sciatica   [place] [facility] at [facility], Chales Colorado, MD   1 month ago Acute right-sided low back pain with right-sided sciatica   [place] [facility] at [facility], Chales Colorado, MD   1 month ago Candida infection   [place] [facility] at [facility], Chales Colorado, MD   2 months ago Type II diabetes mellitus with complication [facility])   [place] [facility] at [facility], Chales Colorado, MD       Future Appointments             In 1 month Gala Jubilee, Chales Colorado, MD [facility] at [facility], [facility]

## 2023-10-04 ENCOUNTER — Encounter: Payer: Self-pay | Admitting: Internal Medicine

## 2023-10-04 ENCOUNTER — Telehealth: Payer: Self-pay | Admitting: Student in an Organized Health Care Education/Training Program

## 2023-10-04 ENCOUNTER — Ambulatory Visit (INDEPENDENT_AMBULATORY_CARE_PROVIDER_SITE_OTHER): Payer: Medicare (Managed Care) | Admitting: Internal Medicine

## 2023-10-04 VITALS — BP 134/74 | HR 84 | Ht 66.0 in | Wt 188.2 lb

## 2023-10-04 DIAGNOSIS — E118 Type 2 diabetes mellitus with unspecified complications: Secondary | ICD-10-CM

## 2023-10-04 DIAGNOSIS — I10 Essential (primary) hypertension: Secondary | ICD-10-CM

## 2023-10-04 DIAGNOSIS — S0003XA Contusion of scalp, initial encounter: Secondary | ICD-10-CM | POA: Diagnosis not present

## 2023-10-04 MED ORDER — LANCETS MISC. MISC: 1.0000 | Freq: Three times a day (TID) | 0 refills | Status: AC

## 2023-10-04 MED ORDER — BLOOD GLUCOSE MONITORING SUPPL DEVI: 1.0000 | Freq: Three times a day (TID) | 0 refills | Status: AC

## 2023-10-04 MED ORDER — BLOOD GLUCOSE TEST VI STRP: 1.0000 | ORAL_STRIP | Freq: Three times a day (TID) | 0 refills | Status: AC

## 2023-10-04 MED ORDER — LANCET DEVICE MISC
1.0000 | Freq: Three times a day (TID) | 0 refills | Status: AC
Start: 2023-10-04 — End: 2023-11-03

## 2023-10-04 NOTE — Assessment & Plan Note (Signed)
 Blood pressure is well controlled.  Current medications irbesartan  and amlodipine . Will continue same regimen along with efforts to limit dietary sodium.

## 2023-10-04 NOTE — Progress Notes (Signed)
 Date:  10/04/2023   Name:  Molly Ruiz   DOB:  [birth year]   MRN:  [id number]   Chief Complaint: Hospitalization Follow-up (Requesting a new RX for glucometer) Fall - fell at home by missing the chair seat and hit the left side of her head on the closet door.  She went to ER - head CT was negative.  She had no other injuries.  Now she feels well with no HA or pain.  Diabetes She presents for her follow-up diabetic visit. She has type 2 diabetes mellitus. Her disease course has been stable. Pertinent negatives for hypoglycemia include no dizziness, headaches or nervousness/anxiousness. Pertinent negatives for diabetes include no chest pain, no fatigue and no weakness. Symptoms are resolved.  Back Pain This is a new problem. The current episode started more than 1 month ago. The problem has been gradually improving since onset. The pain is present in the lumbar spine. The quality of the pain is described as aching and burning. The pain radiates to the right foot. The pain is mild. Pertinent negatives include no abdominal pain, chest pain, headaches or weakness. Treatments tried: seeing pain management - contemplating ESI.    Review of Systems  Constitutional:  Negative for fatigue and unexpected weight change.  HENT:  Negative for trouble swallowing.   Eyes:  Negative for visual disturbance.  Respiratory:  Negative for cough, chest tightness, shortness of breath and wheezing.   Cardiovascular:  Negative for chest pain, palpitations and leg swelling.  Gastrointestinal:  Negative for abdominal pain, constipation and diarrhea.  Musculoskeletal:  Positive for back pain. Negative for arthralgias and myalgias.  Neurological:  Negative for dizziness, weakness, light-headedness and headaches.  Psychiatric/Behavioral:  Negative for dysphoric mood and sleep disturbance. The patient is not nervous/anxious.      Lab Results  Component Value Date   NA 138 09/28/2023   K 4.4 09/28/2023   CO2 26  09/28/2023   GLUCOSE 125 (H) 09/28/2023   BUN 11 09/28/2023   CREATININE 0.83 09/28/2023   CALCIUM  9.7 09/28/2023   EGFR 60 03/13/2023   GFRNONAA >60 09/28/2023   Lab Results  Component Value Date   CHOL 189 11/08/2022   HDL 68 11/08/2022   LDLCALC 95 11/08/2022   TRIG 155 (H) 11/08/2022   CHOLHDL 2.8 11/08/2022   Lab Results  Component Value Date   TSH 1.420 11/08/2022   Lab Results  Component Value Date   HGBA1C 6.4 (A) 07/14/2023   Lab Results  Component Value Date   WBC 4.8 09/28/2023   HGB 13.1 09/28/2023   HCT 38.8 09/28/2023   MCV 93.3 09/28/2023   PLT 244 09/28/2023   Lab Results  Component Value Date   ALT 13 11/08/2022   AST 21 11/08/2022   ALKPHOS 123 (H) 11/08/2022   BILITOT 0.4 11/08/2022   Lab Results  Component Value Date   VD25OH 37.3 11/02/2021     Patient Active Problem List   Diagnosis Date Noted   Lumbar radiculopathy 09/26/2023   Foraminal stenosis of lumbar region 09/26/2023   Spinal stenosis, lumbar region, with neurogenic claudication 09/26/2023   Acute right-sided low back pain with right-sided sciatica 08/25/2023   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 03/09/2022   Stage 3a chronic kidney disease (HCC) 11/29/2021   Acute pain of right shoulder 09/24/2020   Statin myopathy 03/31/2020   Gastroesophageal reflux disease 09/19/2019   Special screening for malignant neoplasms, colon    Tubular adenoma of colon  Anxiety disorder 06/13/2019   Osteopenia determined by x-ray 06/06/2018   Essential hypertension 04/26/2018   Eczema 10/25/2017   Slow transit constipation 02/06/2017   Primary osteoarthritis of left hip 01/12/2017   OSA on CPAP 01/02/2017   Type II diabetes mellitus with complication (HCC) 06/24/2016   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   Hyperlipidemia associated with type 2 diabetes mellitus (HCC) 06/24/2016   Obesity (BMI 30.0-34.9) 06/24/2016   Vitamin D  deficiency 06/24/2016   History  of shingles 06/24/2016   Allergic rhinitis 06/24/2016    Allergies  Allergen Reactions   Atorvastatin  Other (See Comments)    Peripheral neuropathy and myalgia   Influenza Vaccines Rash    Past Surgical History:  Procedure Laterality Date   ABDOMINAL HYSTERECTOMY     BILATERAL CARPAL TUNNEL RELEASE     CATARACT EXTRACTION W/PHACO Right 11/12/2018   Procedure: CATARACT EXTRACTION PHACO AND INTRAOCULAR LENS PLACEMENT (IOC)  RIGHT DIABETIC;  Surgeon: Rosa College, MD;  Location[facility];  Service: Ophthalmology;  Laterality: Right;  Diabetic - diet controlled sleep apnea   CERVICAL FUSION  2004   COLONOSCOPY WITH PROPOFOL  N/A 07/22/2019   Procedure: COLONOSCOPY WITH BIOPSIES;  Surgeon: Marnee Sink, MD;  Location: [facility];  Service: Endoscopy;  Laterality: N/A;  Diabetic - diet controlled priority 4   FOOT SURGERY     HAND SURGERY Left 06/2022   palmar cyst removed   LUMBAR DISC SURGERY  1998   POLYPECTOMY N/A 07/22/2019   Procedure: POLYPECTOMY;  Surgeon: Marnee Sink, MD;  Location: [facility];  Service: Endoscopy;  Laterality: N/A;    Social History   Tobacco Use   Smoking status: Never   Smokeless tobacco: Never  Vaping Use   Vaping status: Never Used  Substance Use Topics   Alcohol use: No   Drug use: No     Medication list has been reviewed and updated.  Current Meds  Medication Sig   acetaminophen  (TYLENOL ) 500 MG tablet Take 2 tablets (1,000 mg total) by mouth 2 (two) times daily.   amLODipine  (NORVASC ) 5 MG tablet Take 1 tablet by mouth once daily   Bisacodyl (LAXATIVE PO) Take by mouth. Generic OTC PRN   Blood Glucose Monitoring Suppl DEVI 1 each by Does not apply route in the morning, at noon, and at bedtime. May substitute to any manufacturer covered by patient's insurance.   buPROPion  (WELLBUTRIN  XL) 300 MG 24 hr tablet Take 1 tablet by mouth once daily   CALCIUM  CITRATE PO Take 1,200 mg by mouth daily.    cholecalciferol (VITAMIN D3) 25 MCG (1000 UNIT) tablet Take 1,000 Units by mouth daily.   ezetimibe  (ZETIA ) 10 MG tablet Take 1 tablet by mouth once daily   famotidine  (PEPCID ) 40 MG tablet Take 1 tablet (40 mg total) by mouth at bedtime.   Glucose Blood (BLOOD GLUCOSE TEST STRIPS) STRP 1 each by In Vitro route in the morning, at noon, and at bedtime. May substitute to any manufacturer covered by patient's insurance.   glucose blood (ONETOUCH ULTRA) test strip Test Blood Sugar twice daily.   irbesartan  (AVAPRO ) 300 MG tablet Take 1 tablet (300 mg total) by mouth daily.   Lancet Device MISC 1 each by Does not apply route in the morning, at noon, and at bedtime. May substitute to any manufacturer covered by patient's insurance.   Lancets Misc. MISC 1 each by Does not apply route in the morning, at noon, and at bedtime. May  substitute to any manufacturer covered by patient's insurance.   meloxicam  (MOBIC ) 15 MG tablet Take 1 tablet by mouth once daily   Multiple Vitamins-Minerals (MULTIVITAMIN ADULTS 50+ PO) Take 1 tablet by mouth daily.   mupirocin  ointment (BACTROBAN ) 2 % Apply 1 Application topically 2 (two) times daily.   NON FORMULARY CPAP @@ 12 cm H20   nystatin  cream (MYCOSTATIN ) Apply to affected area 2 times daily   nystatin -triamcinolone  ointment (MYCOLOG) Apply 1 Application topically 2 (two) times daily.   Omega-3 Fatty Acids (FISH OIL) 1000 MG CAPS Take 1 capsule by mouth daily.   OneTouch Delica Lancets 33G MISC Inject 1 each into the skin daily.   REFRESH 1.4-0.6 % SOLN Place 1 drop into both eyes as needed.       10/04/2023    2:06 PM 09/07/2023   11:01 AM 08/25/2023    1:47 PM 08/21/2023    3:15 PM  GAD 7 : Generalized Anxiety Score  Nervous, Anxious, on Edge 0 1 0 1  Control/stop worrying 0 0 0 0  Worry too much - different things 0 0  0  Trouble relaxing 0 1    Restless 3 1  0  Easily annoyed or irritable 0 1  0  Afraid - awful might happen 0 0  0  Total GAD 7 Score 3 4     Anxiety Difficulty Not difficult at all Not difficult at all  Not difficult at all       10/04/2023    2:05 PM 09/26/2023    9:45 AM 09/07/2023   11:01 AM  Depression screen PHQ 2/9  Decreased Interest 0 0 0  Down, Depressed, Hopeless 0 0 0  PHQ - 2 Score 0 0 0  Altered sleeping 0  1  Tired, decreased energy 1  1  Change in appetite 0  0  Feeling bad or failure about yourself  0  0  Trouble concentrating 0  0  Moving slowly or fidgety/restless 0  0  Suicidal thoughts 0  0  PHQ-9 Score 1  2  Difficult doing work/chores Not difficult at all  Not difficult at all    BP Readings from Last 3 Encounters:  10/04/23 134/74  09/28/23 (!) 153/68  09/26/23 (!) 164/70    Physical Exam Vitals and nursing note reviewed.  Constitutional:      General: She is not in acute distress.    Appearance: Normal appearance. She is well-developed.  HENT:     Head: Normocephalic and atraumatic.   Cardiovascular:     Rate and Rhythm: Normal rate and regular rhythm.  Pulmonary:     Effort: Pulmonary effort is normal. No respiratory distress.     Breath sounds: No wheezing or rhonchi.  Musculoskeletal:     Cervical back: Normal range of motion.     Right lower leg: No edema.     Left lower leg: No edema.  Lymphadenopathy:     Cervical: No cervical adenopathy.  Skin:    General: Skin is warm and dry.     Findings: No rash.  Neurological:     Mental Status: She is alert and oriented to person, place, and time.  Psychiatric:        Mood and Affect: Mood normal.        Behavior: Behavior normal.     Wt Readings from Last 3 Encounters:  10/04/23 188 lb 4 oz (85.4 kg)  09/28/23 187 lb (84.8 kg)  09/26/23 187 lb (84.8  kg)    BP 134/74   Pulse 84   Ht 5\' 6"  (1.676 m)   Wt 188 lb 4 oz (85.4 kg)   SpO2 97%   BMI 30.38 kg/m   Assessment and Plan:  Problem List Items Addressed This Visit       Unprioritized   Type II diabetes mellitus with complication (HCC) - Primary (Chronic)    BS controlled. Will send Rx for new glucometer.      Relevant Medications   Blood Glucose Monitoring Suppl DEVI   Glucose Blood (BLOOD GLUCOSE TEST STRIPS) STRP   Lancet Device MISC   Lancets Misc. MISC   Essential hypertension (Chronic)   Blood pressure is well controlled.  Current medications irbesartan  and amlodipine . Will continue same regimen along with efforts to limit dietary sodium.       Other Visit Diagnoses       Contusion of scalp, initial encounter       resolving; no issues noted       No follow-ups on file.    Sheron Dixons, MD [facility] [place]

## 2023-10-04 NOTE — Telephone Encounter (Signed)
 Informed patient that Tramadol  will not be prescribed at this time.

## 2023-10-04 NOTE — Assessment & Plan Note (Signed)
 BS controlled. Will send Rx for new glucometer.

## 2023-10-04 NOTE — Telephone Encounter (Addendum)
 Patient is calling to see if Dr Rhesa Celeste will refill her Tramadol . Her PCP will no longer fill it. She has procedure appt scheduled next Wednesday. Please advise patient

## 2023-10-05 ENCOUNTER — Other Ambulatory Visit: Payer: Self-pay | Admitting: Internal Medicine

## 2023-10-05 DIAGNOSIS — F419 Anxiety disorder, unspecified: Secondary | ICD-10-CM

## 2023-10-11 ENCOUNTER — Ambulatory Visit
Admission: RE | Admit: 2023-10-11 | Discharge: 2023-10-11 | Disposition: A | Discharge status: Home / Self Care | Payer: Medicare (Managed Care) | Source: Ambulatory Visit | Attending: Student in an Organized Health Care Education/Training Program | Admitting: Student in an Organized Health Care Education/Training Program

## 2023-10-11 ENCOUNTER — Ambulatory Visit
Payer: Medicare (Managed Care) | Attending: Student in an Organized Health Care Education/Training Program | Admitting: Student in an Organized Health Care Education/Training Program

## 2023-10-11 DIAGNOSIS — M48061 Spinal stenosis, lumbar region without neurogenic claudication: Secondary | ICD-10-CM | POA: Diagnosis not present

## 2023-10-11 DIAGNOSIS — M48062 Spinal stenosis, lumbar region with neurogenic claudication: Secondary | ICD-10-CM | POA: Insufficient documentation

## 2023-10-11 DIAGNOSIS — M5416 Radiculopathy, lumbar region: Secondary | ICD-10-CM | POA: Insufficient documentation

## 2023-10-11 MED ORDER — LIDOCAINE HCL 2 % IJ SOLN
20.0000 mL | Freq: Once | INTRAMUSCULAR | Status: AC
  Administered 2023-10-11: 400 mg
  Filled 2023-10-11: qty 40

## 2023-10-11 MED ORDER — LIDOCAINE HCL 2 % IJ SOLN: 20.0000 mL | Freq: Once | INTRAMUSCULAR | Status: DC

## 2023-10-11 MED ORDER — DEXAMETHASONE SODIUM PHOSPHATE 10 MG/ML IJ SOLN
10.0000 mg | Freq: Once | INTRAMUSCULAR | Status: AC
  Administered 2023-10-11: 10 mg
  Filled 2023-10-11: qty 1

## 2023-10-11 MED ORDER — IOHEXOL 180 MG/ML  SOLN: 10.0000 mL | Freq: Once | INTRAMUSCULAR | Status: DC

## 2023-10-11 MED ORDER — ROPIVACAINE HCL 2 MG/ML IJ SOLN: 1.0000 mL | Freq: Once | INTRAMUSCULAR | Status: DC

## 2023-10-11 MED ORDER — ROPIVACAINE HCL 2 MG/ML IJ SOLN
1.0000 mL | Freq: Once | INTRAMUSCULAR | Status: AC
  Administered 2023-10-11: 1 mL via EPIDURAL
  Filled 2023-10-11: qty 20

## 2023-10-11 MED ORDER — SODIUM CHLORIDE 0.9% FLUSH
1.0000 mL | Freq: Once | INTRAVENOUS | Status: AC
  Administered 2023-10-11: 1 mL

## 2023-10-11 MED ORDER — IOHEXOL 180 MG/ML  SOLN
10.0000 mL | Freq: Once | INTRAMUSCULAR | Status: AC
  Administered 2023-10-11: 10 mL via EPIDURAL
  Filled 2023-10-11: qty 20

## 2023-10-11 MED ORDER — DEXAMETHASONE SODIUM PHOSPHATE 10 MG/ML IJ SOLN: 10.0000 mg | Freq: Once | INTRAMUSCULAR | Status: AC

## 2023-10-11 MED ORDER — SODIUM CHLORIDE 0.9% FLUSH: 1.0000 mL | Freq: Once | INTRAVENOUS | Status: DC

## 2023-10-11 NOTE — Progress Notes (Signed)
 PROVIDER NOTE: Interpretation of information contained herein should be left to medically-trained personnel. Specific patient instructions are provided elsewhere under "Patient Instructions" section of medical record. This document was created in part using STT-dictation technology, any transcriptional errors that may result from this process are unintentional.  Patient: Molly Ruiz Type: Established DOB: February 06, 1941 MRN: [id number] PCP: Sheron Dixons, MD  Service: Procedure DOS: 10/11/2023 Setting: Ambulatory Location: Ambulatory outpatient facility Delivery: Face-to-face Provider: Cephus Collin, MD Specialty: Interventional Pain Management Specialty designation: 09 Location: Outpatient facility Ref. Prov.: Sheron Dixons, MD       Interventional Therapy   Procedure: Lumbar trans-foraminal epidural steroid injection (L-TFESI) #1  Laterality: Right (-RT)  Level: L4 nerve root(s) Imaging: Fluoroscopy-guided         Anesthesia: Local anesthesia (1-2% Lidocaine ) DOS: 10/11/2023  Performed by: Cephus Collin, MD  Purpose: Diagnostic/Therapeutic Indications: Lumbar radicular pain severe enough to impact quality of life or function. 1. Lumbar radiculopathy   2. Foraminal stenosis of lumbar region   3. Spinal stenosis, lumbar region, with neurogenic claudication    NAS-11 Pain score:   Pre-procedure: 2/10   Post-procedure: 0-No pain/10     Position / Prep / Materials:  Position: Prone  Prep solution: ChloraPrep (2% chlorhexidine gluconate and 70% isopropyl alcohol) Prep Area: Entire Posterior Lumbosacral Area.  From the lower tip of the scapula down to the tailbone and from flank to flank. Materials:  Tray: Block Needle(s):  Type: Spinal  Gauge (G): 22  Length: 3.5-in  Qty: 1     H&P (Pre-op Assessment):  [name] is a 83 y.o. (year old), female patient, seen today for interventional treatment. She  has a past surgical history that includes Abdominal hysterectomy;  Bilateral carpal tunnel release; Foot surgery; Lumbar disc surgery (1998); Cervical fusion (2004); Cataract extraction w/PHACO (Right, 11/12/2018); Colonoscopy with propofol  (N/A, 07/22/2019); polypectomy (N/A, 07/22/2019); and Hand surgery (Left, 06/2022). [name] has a current medication list which includes the following prescription(s): acetaminophen , amlodipine , bisacodyl, blood glucose monitoring suppl, bupropion , calcium  citrate, cholecalciferol, ezetimibe , famotidine , blood glucose test strips, onetouch ultra, irbesartan , lancet device, lancets misc., meloxicam , multiple vitamins-minerals, mupirocin  ointment, NON FORMULARY, nystatin  cream, nystatin -triamcinolone  ointment, fish oil, onetouch delica lancets 33g, and refresh, and the following Facility-Administered Medications: iohexol, lidocaine , ropivacaine (pf) 2 mg/ml (0.2%), and sodium chloride flush. Her primarily concern today is the Leg Pain  Initial Vital Signs:  Pulse/HCG Rate: 77ECG Heart Rate: 86 Temp: (!) 97.5 F (36.4 C) Resp: 16 BP: (!) 142/73 SpO2: 100 %  BMI: Estimated body mass index is 28.59 kg/m as calculated from the following:   Height as of this encounter: 5\' 8"  (1.727 m).   Weight as of this encounter: 188 lb (85.3 kg).  Risk Assessment: Allergies: Reviewed. She is allergic to atorvastatin  and influenza vaccines.  Allergy Precautions: None required Coagulopathies: Reviewed. None identified.  Blood-thinner therapy: None at this time Active Infection(s): Reviewed. None identified. [name] is afebrile  Site Confirmation: [name] was asked to confirm the procedure and laterality before marking the site Procedure checklist: Completed Consent: Before the procedure and under the influence of no sedative(s), amnesic(s), or anxiolytics, the patient was informed of the treatment options, risks and possible complications. To fulfill our ethical and legal obligations, as recommended by the American Medical Association's  Code of Ethics, I have informed the patient of my clinical impression; the nature and purpose of the treatment or procedure; the risks, benefits, and possible complications of the intervention; the alternatives, including doing  nothing; the risk(s) and benefit(s) of the alternative treatment(s) or procedure(s); and the risk(s) and benefit(s) of doing nothing. The patient was provided information about the general risks and possible complications associated with the procedure. These may include, but are not limited to: failure to achieve desired goals, infection, bleeding, organ or nerve damage, allergic reactions, paralysis, and death. In addition, the patient was informed of those risks and complications associated to Spine-related procedures, such as failure to decrease pain; infection (i.e.: Meningitis, epidural or intraspinal abscess); bleeding (i.e.: epidural hematoma, subarachnoid hemorrhage, or any other type of intraspinal or peri-dural bleeding); organ or nerve damage (i.e.: Any type of peripheral nerve, nerve root, or spinal cord injury) with subsequent damage to sensory, motor, and/or autonomic systems, resulting in permanent pain, numbness, and/or weakness of one or several areas of the body; allergic reactions; (i.e.: anaphylactic reaction); and/or death. Furthermore, the patient was informed of those risks and complications associated with the medications. These include, but are not limited to: allergic reactions (i.e.: anaphylactic or anaphylactoid reaction(s)); adrenal axis suppression; blood sugar elevation that in diabetics may result in ketoacidosis or comma; water  retention that in patients with history of congestive heart failure may result in shortness of breath, pulmonary edema, and decompensation with resultant heart failure; weight gain; swelling or edema; medication-induced neural toxicity; particulate matter embolism and blood vessel occlusion with resultant organ, and/or nervous system  infarction; and/or aseptic necrosis of one or more joints. Finally, the patient was informed that Medicine is not an exact science; therefore, there is also the possibility of unforeseen or unpredictable risks and/or possible complications that may result in a catastrophic outcome. The patient indicated having understood very clearly. We have given the patient no guarantees and we have made no promises. Enough time was given to the patient to ask questions, all of which were answered to the patient's satisfaction. Ms. Barnard has indicated that she wanted to continue with the procedure. Attestation: I, the ordering provider, attest that I have discussed with the patient the benefits, risks, side-effects, alternatives, likelihood of achieving goals, and potential problems during recovery for the procedure that I have provided informed consent. Date  Time: 10/11/2023 10:32 AM  Pre-Procedure Preparation:  Monitoring: As per clinic protocol. Respiration, ETCO2, SpO2, BP, heart rate and rhythm monitor placed and checked for adequate function Safety Precautions: Patient was assessed for positional comfort and pressure points before starting the procedure. Time-out: I initiated and conducted the "Time-out" before starting the procedure, as per protocol. The patient was asked to participate by confirming the accuracy of the "Time Out" information. Verification of the correct person, site, and procedure were performed and confirmed by me, the nursing staff, and the patient. "Time-out" conducted as per Joint Commission's Universal Protocol (UP.01.01.01). Time: 1121 Start Time: 1121 hrs.  Description/Narrative of Procedure:          Target: The 6 o'clock position under the pedicle, on the affected side. Region: Posterolateral Lumbosacral Approach: Posterior Percutaneous Paravertebral approach.  Rationale (medical necessity): procedure needed and proper for the diagnosis and/or treatment of the patient's medical  symptoms and needs. Procedural Technique Safety Precautions: Aspiration looking for blood return was conducted prior to all injections. At no point did we inject any substances, as a needle was being advanced. No attempts were made at seeking any paresthesias. Safe injection practices and needle disposal techniques used. Medications properly checked for expiration dates. SDV (single dose vial) medications used. Description of the Procedure: Protocol guidelines were followed. The patient was placed  in position over the procedure table. The target area was identified and the area prepped in the usual manner. Skin & deeper tissues infiltrated with local anesthetic. Appropriate amount of time allowed to pass for local anesthetics to take effect. The procedure needles were then advanced to the target area. Proper needle placement secured. Negative aspiration confirmed. Solution injected in intermittent fashion, asking for systemic symptoms every 0.5cc of injectate. The needles were then removed and the area cleansed, making sure to leave some of the prepping solution back to take advantage of its long term bactericidal properties.  Vitals:   10/11/23 1038 10/11/23 1116 10/11/23 1121 10/11/23 1124  BP: (!) 142/73 (!) 159/78 (!) 145/70 (!) 159/81  Pulse: 77     Resp: 16 18 18 17   Temp: (!) 97.5 F (36.4 C)     TempSrc: Temporal     SpO2: 100% 100% 100% 100%  Weight: 188 lb (85.3 kg)     Height: 5\' 8"  (1.727 m)       Start Time: 1121 hrs. End Time: 1124 hrs.  Imaging Guidance (Spinal):          Type of Imaging Technique: Fluoroscopy Guidance (Spinal) Indication(s): Fluoroscopy guidance for needle placement to enhance accuracy in procedures requiring precise needle localization for targeted delivery of medication in or near specific anatomical locations not easily accessible without such real-time imaging assistance. Exposure Time: Please see nurses notes. Contrast: Before injecting any contrast, we  confirmed that the patient did not have an allergy to iodine, shellfish, or radiological contrast. Once satisfactory needle placement was completed at the desired level, radiological contrast was injected. Contrast injected under live fluoroscopy. No contrast complications. See chart for type and volume of contrast used. Fluoroscopic Guidance: I was personally present during the use of fluoroscopy. "Tunnel Vision Technique" used to obtain the best possible view of the target area. Parallax error corrected before commencing the procedure. "Direction-depth-direction" technique used to introduce the needle under continuous pulsed fluoroscopy. Once target was reached, antero-posterior, oblique, and lateral fluoroscopic projection used confirm needle placement in all planes. Images permanently stored in EMR. Interpretation: I personally interpreted the imaging intraoperatively. Adequate needle placement confirmed in multiple planes. Appropriate spread of contrast into desired area was observed. No evidence of afferent or efferent intravascular uptake. No intrathecal or subarachnoid spread observed. Permanent images saved into the patient's record.  Post-operative Assessment:  Post-procedure Vital Signs:  Pulse/HCG Rate: 7793 Temp: (!) 97.5 F (36.4 C) Resp: 17 BP: (!) 159/81 SpO2: 100 %  EBL: None  Complications: No immediate post-treatment complications observed by team, or reported by patient.  Note: The patient tolerated the entire procedure well. A repeat set of vitals were taken after the procedure and the patient was kept under observation following institutional policy, for this type of procedure. Post-procedural neurological assessment was performed, showing return to baseline, prior to discharge. The patient was provided with post-procedure discharge instructions, including a section on how to identify potential problems. Should any problems arise concerning this procedure, the patient was given  instructions to immediately contact us , at any time, without hesitation. In any case, we plan to contact the patient by telephone for a follow-up status report regarding this interventional procedure.  Comments:  No additional relevant information.  Plan of Care (POC)  Orders:  Orders Placed This Encounter  Procedures   DG PAIN CLINIC C-ARM 1-60 MIN NO REPORT    Intraoperative interpretation by procedural physician at [facility].    Standing Status:  Standing    Number of Occurrences:   1    Reason for exam::   Assistance in needle guidance and placement for procedures requiring needle placement in or near specific anatomical locations not easily accessible without such assistance.   DG PAIN CLINIC C-ARM 1-60 MIN NO REPORT    Intraoperative interpretation by procedural physician at [facility].    Standing Status:   Standing    Number of Occurrences:   1    Reason for exam::   Assistance in needle guidance and placement for procedures requiring needle placement in or near specific anatomical locations not easily accessible without such assistance.    Medications ordered for procedure: Meds ordered this encounter  Medications   iohexol (OMNIPAQUE) 180 MG/ML injection 10 mL    Must be Myelogram-compatible. If not available, you may substitute with a water -soluble, non-ionic, hypoallergenic, myelogram-compatible radiological contrast medium.   lidocaine  (XYLOCAINE ) 2 % (with pres) injection 400 mg   sodium chloride flush (NS) 0.9 % injection 1 mL   ropivacaine (PF) 2 mg/mL (0.2%) (NAROPIN) injection 1 mL   dexamethasone  (DECADRON ) injection 10 mg   iohexol (OMNIPAQUE) 180 MG/ML injection 10 mL    Must be Myelogram-compatible. If not available, you may substitute with a water -soluble, non-ionic, hypoallergenic, myelogram-compatible radiological contrast medium.   lidocaine  (XYLOCAINE ) 2 % (with pres) injection 400 mg   sodium chloride flush (NS) 0.9 % injection 1  mL   ropivacaine (PF) 2 mg/mL (0.2%) (NAROPIN) injection 1 mL   dexamethasone  (DECADRON ) injection 10 mg   Medications administered: We administered iohexol, lidocaine , sodium chloride flush, ropivacaine (PF) 2 mg/mL (0.2%), and dexamethasone .  See the medical record for exact dosing, route, and time of administration.    Follow-up plan:   Return in about 4 weeks (around 11/08/2023).     Recent Visits Date Type Provider Dept  09/26/23 Office Visit Cephus Collin, MD [facility]  Showing recent visits within past 90 days and meeting all other requirements Today's Visits Date Type Provider Dept  10/11/23 Procedure visit Cephus Collin, MD [facility]  Showing today's visits and meeting all other requirements Future Appointments No visits were found meeting these conditions. Showing future appointments within next 90 days and meeting all other requirements   Disposition: Discharge home  Discharge (Date  Time): 10/11/2023; 1135 hrs.   Primary Care Physician: Sheron Dixons, MD Location: [facility] Note by: Cephus Collin, MD (TTS technology used. I apologize for any typographical errors that were not detected and corrected.) Date: 10/11/2023; Time: 11:29 AM  Disclaimer:  Medicine is not an Visual merchandiser. The only guarantee in medicine is that nothing is guaranteed. It is important to note that the decision to proceed with this intervention was based on the information collected from the patient. The Data and conclusions were drawn from the patient's questionnaire, the interview, and the physical examination. Because the information was provided in large part by the patient, it cannot be guaranteed that it has not been purposely or unconsciously manipulated. Every effort has been made to obtain as much relevant data as possible for this evaluation. It is important to note that the conclusions that lead to this procedure are derived in large  part from the available data. Always take into account that the treatment will also be dependent on availability of resources and existing treatment guidelines, considered by other Pain Management Practitioners as being common knowledge and practice, at the time of the intervention. For  Medico-Legal purposes, it is also important to point out that variation in procedural techniques and pharmacological choices are the acceptable norm. The indications, contraindications, technique, and results of the above procedure should only be interpreted and judged by a Board-Certified Interventional Pain Specialist with extensive familiarity and expertise in the same exact procedure and technique.

## 2023-10-11 NOTE — Patient Instructions (Signed)
____________________________________________________________________________________________  Post-Procedure Discharge Instructions  Instructions: Apply ice:  Purpose: This will minimize any swelling and discomfort after procedure.  When: Day of procedure, as soon as you get home. How: Fill a plastic sandwich bag with crushed ice. Cover it with a small towel and apply to injection site. How long: (15 min on, 15 min off) Apply for 15 minutes then remove x 15 minutes.  Repeat sequence on day of procedure, until you go to bed. Apply heat:  Purpose: To treat any soreness and discomfort from the procedure. When: Starting the next day after the procedure. How: Apply heat to procedure site starting the day following the procedure. How long: May continue to repeat daily, until discomfort goes away. Food intake: Start with clear liquids (like water) and advance to regular food, as tolerated.  Physical activities: Keep activities to a minimum for the first 8 hours after the procedure. After that, then as tolerated. Driving: If you have received any sedation, be responsible and do not drive. You are not allowed to drive for 24 hours after having sedation. Blood thinner: (Applies only to those taking blood thinners) You may restart your blood thinner 6 hours after your procedure. Insulin: (Applies only to Diabetic patients taking insulin) As soon as you can eat, you may resume your normal dosing schedule. Infection prevention: Keep procedure site clean and dry. Shower daily and clean area with soap and water. Post-procedure Pain Diary: Extremely important that this be done correctly and accurately. Recorded information will be used to determine the next step in treatment. For the purpose of accuracy, follow these rules: Evaluate only the area treated. Do not report or include pain from an untreated area. For the purpose of this evaluation, ignore all other areas of pain, except for the treated area. After  your procedure, avoid taking a long nap and attempting to complete the pain diary after you wake up. Instead, set your alarm clock to go off every hour, on the hour, for the initial 8 hours after the procedure. Document the duration of the numbing medicine, and the relief you are getting from it. Do not go to sleep and attempt to complete it later. It will not be accurate. If you received sedation, it is likely that you were given a medication that may cause amnesia. Because of this, completing the diary at a later time may cause the information to be inaccurate. This information is needed to plan your care. Follow-up appointment: Keep your post-procedure follow-up evaluation appointment after the procedure (usually 2 weeks for most procedures, 6 weeks for radiofrequencies). DO NOT FORGET to bring you pain diary with you.   Expect: (What should I expect to see with my procedure?) From numbing medicine (AKA: Local Anesthetics): Numbness or decrease in pain. You may also experience some weakness, which if present, could last for the duration of the local anesthetic. Onset: Full effect within 15 minutes of injected. Duration: It will depend on the type of local anesthetic used. On the average, 1 to 8 hours.  From steroids (Applies only if steroids were used): Decrease in swelling or inflammation. Once inflammation is improved, relief of the pain will follow. Onset of benefits: Depends on the amount of swelling present. The more swelling, the longer it will take for the benefits to be seen. In some cases, up to 10 days. Duration: Steroids will stay in the system x 2 weeks. Duration of benefits will depend on multiple posibilities including persistent irritating factors. Side-effects: If present, they   may typically last 2 weeks (the duration of the steroids). Frequent: Cramps (if they occur, drink Gatorade and take over-the-counter Magnesium 450-500 mg once to twice a day); water retention with temporary  weight gain; increases in blood sugar; decreased immune system response; increased appetite. Occasional: Facial flushing (red, warm cheeks); mood swings; menstrual changes. Uncommon: Long-term decrease or suppression of natural hormones; bone thinning. (These are more common with higher doses or more frequent use. This is why we prefer that our patients avoid having any injection therapies in other practices.)  Very Rare: Severe mood changes; psychosis; aseptic necrosis. From procedure: Some discomfort is to be expected once the numbing medicine wears off. This should be minimal if ice and heat are applied as instructed.  Call if: (When should I call?) You experience numbness and weakness that gets worse with time, as opposed to wearing off. New onset bowel or bladder incontinence. (Applies only to procedures done in the spine)  Emergency Numbers: Durning business hours (Monday - Thursday, 8:00 AM - 4:00 PM) (Friday, 9:00 AM - 12:00 Noon): [phone] After hours: [phone] NOTE: If you are having a problem and are unable connect with, or to talk to a provider, then go to your nearest urgent care or emergency department. If the problem is serious and urgent, please call 911. ____________________________________________________________________________________________  Selective Nerve Root Block Patient Information  Description: Specific nerve roots exit the spinal canal and these nerves can be compressed and inflamed by a bulging disc and bone spurs.  By injecting steroids on the nerve root, we can potentially decrease the inflammation surrounding these nerves, which often leads to decreased pain.  Also, by injecting local anesthesia on the nerve root, this can provide us helpful information to give to your referring doctor if it decreases your pain.  Selective nerve root blocks can be done along the spine from the neck to the low back depending on the location of your pain.   After numbing  the skin with local anesthesia, a small needle is passed to the nerve root and the position of the needle is verified using x-ray pictures.  After the needle is in correct position, we then deposit the medication.  You may experience a pressure sensation while this is being done.  The entire block usually lasts less than 15 minutes.  Conditions that may be treated with selective nerve root blocks: Low back and leg pain Spinal stenosis Diagnostic block prior to potential surgery Neck and arm pain Post laminectomy syndrome  Preparation for the injection:  Do not eat any solid food or dairy products within 8 hours of your appointment. You may drink clear liquids up to 3 hours before an appointment.  Clear liquids include water, black coffee, juice or soda.  No milk or cream please. You may take your regular medications, including pain medications, with a sip of water before your appointment.  Diabetics should hold regular insulin (if taken separately) and take 1/2 normal NPH dose the morning of the procedure.  Carry some sugar containing items with you to your appointment. A driver must accompany you and be prepared to drive you home after your procedure. Bring all your current medications with you. An IV may be inserted and sedation may be given at the discretion of the physician. A blood pressure cuff, EKG, and other monitors will often be applied during the procedure.  Some patients may need to have extra oxygen administered for a short period. You will be asked to provide   medical information, including allergies, prior to the procedure.  We must know immediately if you are taking blood  Thinners (like Coumadin) or if you are allergic to IV iodine contrast (dye).  Possible side-effects: All are usually temporary Bleeding from needle site Light headedness Numbness and tingling Decreased blood pressure Weakness in arms/legs Pressure sensation in back/neck Pain at injection site (several  days)  Possible complications: All are extremely rare Infection Nerve injury Spinal headache (a headache wore with upright position)  Call if you experience: Fever/chills associated with headache or increased back/neck pain Headache worsened by an upright position New onset weakness or numbness of an extremity below the injection site Hives or difficulty breathing (go to the emergency room) Inflammation or drainage at the injection site(s) Severe back/neck pain greater than usual New symptoms which are concerning to you  Please note:  Although the local anesthetic injected can often make your back or neck feel good for several hours after the injection the pain will likely return.  It takes 3-5 days for steroids to work on the nerve root. You may not notice any pain relief for at least one week.  If effective, we will often do a series of 3 injections spaced 3-6 weeks apart to maximally decrease your pain.    If you have any questions, please call [phone] [facility] 

## 2023-10-12 ENCOUNTER — Telehealth: Payer: Self-pay | Admitting: *Deleted

## 2023-10-12 NOTE — Telephone Encounter (Signed)
 Post procedure call; voicemail left

## 2023-10-30 ENCOUNTER — Ambulatory Visit: Payer: Medicare (Managed Care) | Attending: Physician Assistant

## 2023-10-30 DIAGNOSIS — M5416 Radiculopathy, lumbar region: Secondary | ICD-10-CM | POA: Insufficient documentation

## 2023-11-02 DIAGNOSIS — H43811 Vitreous degeneration, right eye: Secondary | ICD-10-CM | POA: Diagnosis not present

## 2023-11-02 LAB — HM DIABETES EYE EXAM

## 2023-11-03 ENCOUNTER — Ambulatory Visit: Payer: Medicare (Managed Care)

## 2023-11-03 DIAGNOSIS — M5416 Radiculopathy, lumbar region: Secondary | ICD-10-CM | POA: Diagnosis not present

## 2023-11-03 NOTE — Therapy (Signed)
 OUTPATIENT PHYSICAL THERAPY THORACOLUMBAR EVALUATION   Patient Name: Molly Ruiz MRN: [id number] DOB:1940/12/30, 83 y.o., female Today's Date: 11/03/2023  END OF SESSION:  PT End of Session - 11/03/23 0759     Visit Number 1    Number of Visits 13    Date for PT Re-Evaluation 12/15/23    Authorization Type 2x/week x 6 weeks    PT Start Time 0800    PT Stop Time 0845    PT Time Calculation (min) 45 min          Past Medical History:  Diagnosis Date   Complication of anesthesia    Hair falls out.   Depression    Diabetes mellitus without complication (HCC)    Hyperlipidemia    Hypertension    Sleep apnea    CPAP   Wears dentures    partial upper   Past Surgical History:  Procedure Laterality Date   ABDOMINAL HYSTERECTOMY     BILATERAL CARPAL TUNNEL RELEASE     CATARACT EXTRACTION W/PHACO Right 11/12/2018   Procedure: CATARACT EXTRACTION PHACO AND INTRAOCULAR LENS PLACEMENT (IOC)  RIGHT DIABETIC;  Surgeon: Myrna Adine Anes, MD;  Location: [facility];  Service: Ophthalmology;  Laterality: Right;  Diabetic - diet controlled sleep apnea   CERVICAL FUSION  2004   COLONOSCOPY WITH PROPOFOL  N/A 07/22/2019   Procedure: COLONOSCOPY WITH BIOPSIES;  Surgeon: Jinny Carmine, MD;  Location: [facility];  Service: Endoscopy;  Laterality: N/A;  Diabetic - diet controlled priority 4   FOOT SURGERY     HAND SURGERY Left 06/2022   palmar cyst removed   LUMBAR DISC SURGERY  1998   POLYPECTOMY N/A 07/22/2019   Procedure: POLYPECTOMY;  Surgeon: Jinny Carmine, MD;  Location: [facility];  Service: Endoscopy;  Laterality: N/A;   Patient Active Problem List   Diagnosis Date Noted   Lumbar radiculopathy 09/26/2023   Foraminal stenosis of lumbar region 09/26/2023   Spinal stenosis, lumbar region, with neurogenic claudication 09/26/2023   Acute right-sided low back pain with right-sided sciatica 08/25/2023   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 03/09/2022    Stage 3a chronic kidney disease (HCC) 11/29/2021   Acute pain of right shoulder 09/24/2020   Statin myopathy 03/31/2020   Gastroesophageal reflux disease 09/19/2019   Special screening for malignant neoplasms, colon    Tubular adenoma of colon    Anxiety disorder 06/13/2019   Osteopenia determined by x-ray 06/06/2018   Essential hypertension 04/26/2018   Eczema 10/25/2017   Slow transit constipation 02/06/2017   Primary osteoarthritis of left hip 01/12/2017   OSA on CPAP 01/02/2017   Type II diabetes mellitus with complication (HCC) 06/24/2016   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   Hyperlipidemia associated with type 2 diabetes mellitus (HCC) 06/24/2016   Obesity (BMI 30.0-34.9) 06/24/2016   Vitamin D  deficiency 06/24/2016   History of shingles 06/24/2016   Allergic rhinitis 06/24/2016   PCP: Leita Adie, MD  REFERRING PROVIDER: Lyle Decamp, [facility]  REFERRING DIAG: M54.16 (ICD-10-CM) - Acute lumbar radiculopathy   Rationale for Evaluation and Treatment: Rehabilitation  THERAPY DIAG:  No diagnosis found.  ONSET DATE: 1 month ago  SUBJECTIVE:  SUBJECTIVE STATEMENT: Pt reports tightness/soreness in R lateral/posterior leg.  Was having low back pain and had an epidural steroid injection (R L4 nerve root) a few weeks ago (10/11/23), this has helped significantly with sx.  Has noticed her standing/walking tolerance is limited due to her sx.  Prior to injection her standing tolerance was 3 min a time, had to shower sitting on her shower chair and had to take breaks often while cooking/meal prep.  These are improving.  PERTINENT HISTORY:   Spinal stenosis- difficulty walking, has been using a cane during recent flare up until just a few days ago; prior to injection was  having R low back and leg pain/parasthesias to foot  She reports having cervical spine fusion surgery >20 years ago    PAIN:  Are you having pain? Currently 0/10   PRECAUTIONS: None  RED FLAGS: None   WEIGHT BEARING RESTRICTIONS: No  FALLS:  Has patient fallen in last 6 months? Yes. 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   NEXT MD VISIT: 11/07/23  OBJECTIVE:  Note: Objective measures were completed at Evaluation unless otherwise noted.  DIAGNOSTIC FINDINGS:  Per chart review, April 2025 MRI lumbar spine IMPRESSION: 1. 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 Bulging of the disc with biforaminal protrusions. Severe multifactorial stenosis at the disc level. Bilateral foraminal stenosis could compress either or both L4 nerves. The facet arthritis would likely be painful. 2. L2-3: Disc bulge. Facet and ligamentous hypertrophy. Mild multifactorial stenosis but without evidence of focal neural compression. 3. L3-4: Disc bulge. Facet and ligamentous hypertrophy. Mild stenosis of both lateral recesses but no definite neural compression. 4. L5-S1: Disc bulge. Mild facet degeneration. Mild proximal foraminal stenosis but without definite compression of the exiting L5 nerves.      PATIENT SURVEYS:  MODI: at visit #2  COGNITION: Overall cognitive status: Within functional limits for tasks assessed     SENSATION: WFL  MUSCLE LENGTH: (+) R SLR for limited mobility at 50 deg compared to L at 60 deg, no parasthesias reported  POSTURE: rounded shoulders  PALPATION: TTP R gluteal  mm  LUMBAR ROM:   AROM eval  Flexion Mod limited  Extension Mod limited  Right lateral flexion Mod limited and painful  Left lateral flexion Mod limited  Right rotation   Left rotation    (Blank rows = not tested)  LOWER EXTREMITY ROM:     Active  Right eval Left eval  Hip flexion 110 110  Hip extension    Hip abduction    Hip adduction    Hip internal rotation    Hip external rotation 40 40  Knee flexion 130 130  Knee extension 0 0  Ankle dorsiflexion    Ankle plantarflexion    Ankle inversion    Ankle eversion     (Blank rows = not tested)  LOWER EXTREMITY MMT:    MMT Right eval Left eval  Hip flexion 4+ 4+  Hip extension    Hip abduction    Hip adduction    Hip internal rotation    Hip external rotation    Knee flexion  5 5  Knee extension 4 4+  Ankle dorsiflexion 4 5  Ankle plantarflexion 4 5  Ankle inversion    Ankle eversion     (Blank rows = not tested)  LUMBAR SPECIAL TESTS:  (+) SLR R for limited mobility and R lateral LE sx (thigh)  FUNCTIONAL TESTS:  5 times sit to stand: 22 seconds  GAIT: Amb without any AD into clinic today  TREATMENT DATE: 11/03/23                                                                                                                                 PATIENT EDUCATION:  Education details: PT POC/goals Person educated: Patient Education method: Explanation Education comprehension: verbalized understanding  HOME EXERCISE PROGRAM: To be initiated  ASSESSMENT:  CLINICAL IMPRESSION: Patient is a 83 y.o. F who was seen today for physical therapy evaluation and treatment for R L4 radiculopathy which seems to be improving since she underwent an epidural steroid injection on 10/11/23.   OBJECTIVE IMPAIRMENTS: decreased activity tolerance, decreased balance, decreased mobility, difficulty walking, decreased ROM, and decreased strength.   ACTIVITY LIMITATIONS: lifting, standing, squatting, transfers, and locomotion  level  PARTICIPATION LIMITATIONS: meal prep, cleaning, laundry, shopping, and community activity  PERSONAL FACTORS: Age and Time since onset of injury/illness/exacerbation are also affecting patient's functional outcome.   REHAB POTENTIAL: Good  CLINICAL DECISION MAKING: Stable/uncomplicated  EVALUATION COMPLEXITY: Low   GOALS: Goals reviewed with patient? Yes  SHORT TERM GOALS: Target date: 11/17/23  Pt will be able to tolerate standing x 20 min for meal prep in her kitchen without rest break Baseline: 3 min Goal status: INITIAL    LONG TERM GOALS: Target date: 12/15/23  Improve MODI score >10% indicating pt able to perform her daily activities without functionally being a significantly limited by her lower back sx Baseline: to be administered at visit 2 Goal status: INITIAL  2.  Improve 5x STS to <15 seconds indicating pt's LE strength and risk for falls have improved significantly Baseline: 22 sec Goal status: INITIAL  3.  Improve R SLR 10 deg without onset of R LE sx to facilitate pt being able to ambulate and perform her daily activities at home without being limited by R LE sx Baseline: 50 deg, lateral LE sx Goal status: INITIAL   PLAN:  PT FREQUENCY: 2x/week  PT DURATION: 6 weeks  PLANNED INTERVENTIONS: [id number]-Therapeutic exercises, 97530- Therapeutic activity, [id number]- Neuromuscular re-education, 97535- Self Care, and [id number]- Manual therapy.  PLAN FOR NEXT SESSION: MODI, LE mobility/strength, core mm retraining, HEP initiation  Vernell Reges, PT, DPT, OCS   Tyson Masin E Olivette Beckmann, PT 11/03/2023, 8:00 AM

## 2023-11-06 ENCOUNTER — Ambulatory Visit: Payer: Medicare (Managed Care)

## 2023-11-06 DIAGNOSIS — M5416 Radiculopathy, lumbar region: Secondary | ICD-10-CM

## 2023-11-06 NOTE — Therapy (Signed)
 OUTPATIENT PHYSICAL THERAPY THORACOLUMBAR TREATMENT   Patient Name: Molly Ruiz MRN: [id number] DOB:1940/06/01, 83 y.o., female Today's Date: 11/06/2023  END OF SESSION:  PT End of Session - 11/06/23 1501     Visit Number 2    Number of Visits 13    Date for PT Re-Evaluation 12/15/23    Authorization Type 2x/week x 6 weeks    PT Start Time 1500    PT Stop Time 1545    PT Time Calculation (min) 45 min    Activity Tolerance Patient tolerated treatment well    Behavior During Therapy WFL for tasks assessed/performed          Past Medical History:  Diagnosis Date   Complication of anesthesia    Hair falls out.   Depression    Diabetes mellitus without complication (HCC)    Hyperlipidemia    Hypertension    Sleep apnea    CPAP   Wears dentures    partial upper   Past Surgical History:  Procedure Laterality Date   ABDOMINAL HYSTERECTOMY     BILATERAL CARPAL TUNNEL RELEASE     CATARACT EXTRACTION W/PHACO Right 11/12/2018   Procedure: CATARACT EXTRACTION PHACO AND INTRAOCULAR LENS PLACEMENT (IOC)  RIGHT DIABETIC;  Surgeon: Myrna Adine Anes, MD;  Location: [facility];  Service: Ophthalmology;  Laterality: Right;  Diabetic - diet controlled sleep apnea   CERVICAL FUSION  2004   COLONOSCOPY WITH PROPOFOL  N/A 07/22/2019   Procedure: COLONOSCOPY WITH BIOPSIES;  Surgeon: Jinny Carmine, MD;  Location: [facility];  Service: Endoscopy;  Laterality: N/A;  Diabetic - diet controlled priority 4   FOOT SURGERY     HAND SURGERY Left 06/2022   palmar cyst removed   LUMBAR DISC SURGERY  1998   POLYPECTOMY N/A 07/22/2019   Procedure: POLYPECTOMY;  Surgeon: Jinny Carmine, MD;  Location: [facility];  Service: Endoscopy;  Laterality: N/A;   Patient Active Problem List   Diagnosis Date Noted   Lumbar radiculopathy 09/26/2023   Foraminal stenosis of lumbar region 09/26/2023   Spinal stenosis, lumbar region, with neurogenic claudication 09/26/2023   Acute  right-sided low back pain with right-sided sciatica 08/25/2023   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 03/09/2022   Stage 3a chronic kidney disease (HCC) 11/29/2021   Acute pain of right shoulder 09/24/2020   Statin myopathy 03/31/2020   Gastroesophageal reflux disease 09/19/2019   Special screening for malignant neoplasms, colon    Tubular adenoma of colon    Anxiety disorder 06/13/2019   Osteopenia determined by x-ray 06/06/2018   Essential hypertension 04/26/2018   Eczema 10/25/2017   Slow transit constipation 02/06/2017   Primary osteoarthritis of left hip 01/12/2017   OSA on CPAP 01/02/2017   Type II diabetes mellitus with complication (HCC) 06/24/2016   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   Hyperlipidemia associated with type 2 diabetes mellitus (HCC) 06/24/2016   Obesity (BMI 30.0-34.9) 06/24/2016   Vitamin D  deficiency 06/24/2016   History of shingles 06/24/2016   Allergic rhinitis 06/24/2016   PCP: Leita Adie, MD  REFERRING PROVIDER: Lyle Decamp, [facility]  REFERRING DIAG: [phone] (ICD-10-CM) - Acute lumbar radiculopathy   Rationale for Evaluation and Treatment: Rehabilitation  THERAPY DIAG:  Lumbar radiculopathy  ONSET DATE: 1 month ago  SUBJECTIVE:  SUBJECTIVE STATEMENT: Pt reports tightness/soreness in R lateral/posterior leg.  Was having low back pain and had an epidural steroid injection (R L4 nerve root) a few weeks ago (10/11/23), this has helped significantly with sx.  Has noticed her standing/walking tolerance is limited due to her sx.  Prior to injection her standing tolerance was 3 min a time, had to shower sitting on her shower chair and had to take breaks often while cooking/meal prep.  These are improving.  PERTINENT HISTORY:   Spinal stenosis-  difficulty walking, has been using a cane during recent flare up until just a few days ago; prior to injection was having R low back and leg pain/parasthesias to foot  She reports having cervical spine fusion surgery >20 years ago    PAIN:  Are you having pain? Currently 0/10   PRECAUTIONS: None  RED FLAGS: None   WEIGHT BEARING RESTRICTIONS: No  FALLS:  Has patient fallen in last 6 months? Yes. 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   NEXT MD VISIT: 11/07/23  OBJECTIVE:  Note: Objective measures were completed at Evaluation unless otherwise noted.  DIAGNOSTIC FINDINGS:  Per chart review, April 2025 MRI lumbar spine IMPRESSION: 1. 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 Bulging of the disc with biforaminal protrusions. Severe multifactorial stenosis at the disc level. Bilateral foraminal stenosis could compress either or both L4 nerves. The facet arthritis would likely be painful. 2. L2-3: Disc bulge. Facet and ligamentous hypertrophy. Mild multifactorial stenosis but without evidence of focal neural compression. 3. L3-4: Disc bulge. Facet and ligamentous hypertrophy. Mild stenosis of both lateral recesses but no definite neural compression. 4. L5-S1: Disc bulge. Mild facet degeneration. Mild proximal foraminal stenosis but without definite compression of the exiting L5 nerves.      PATIENT SURVEYS:  MODI: at visit #2  COGNITION: Overall cognitive status: Within functional limits for tasks assessed     SENSATION: WFL  MUSCLE LENGTH: (+) R SLR for limited mobility at 50  deg compared to L at 60 deg, no parasthesias reported  POSTURE: rounded shoulders  PALPATION: TTP R gluteal mm  LUMBAR ROM:   AROM eval  Flexion Mod limited  Extension Mod limited  Right lateral flexion Mod limited and painful  Left lateral flexion Mod limited  Right rotation   Left rotation    (Blank rows = not tested)  LOWER EXTREMITY ROM:     Active  Right eval Left eval  Hip flexion 110 110  Hip extension    Hip abduction    Hip adduction    Hip internal rotation    Hip external rotation 40 40  Knee flexion 130 130  Knee extension 0 0  Ankle dorsiflexion    Ankle plantarflexion    Ankle inversion    Ankle eversion     (Blank rows = not tested)  LOWER EXTREMITY MMT:    MMT Right eval Left eval  Hip flexion 4+ 4+  Hip extension    Hip abduction 4 4  Hip adduction    Hip internal rotation    Hip external rotation    Knee flexion  5 5  Knee extension 4 4+  Ankle dorsiflexion 4 5  Ankle plantarflexion 4 5  Ankle inversion    Ankle eversion     (Blank rows = not tested)  LUMBAR SPECIAL TESTS:  (+) SLR R for limited mobility and R lateral LE sx (thigh)  FUNCTIONAL TESTS:  5 times sit to stand: 22 seconds  GAIT: Amb without any AD into clinic today  TREATMENT DATE: 11/06/23  Subjective: Pt feels like the injection has been helpful; tomorrow she has a f/u with the physician who did the injection.  She does not have back pain today or R lower leg sx.  Pain: 1/10 R lateral LE Objective: Hip abd 4/5 R/L MODI score: 23/50  Therapeutic Exercises: Nustep x level 3 x 10 minutes SKTC x 4 ea, 20 second hold, with PT Hip ER figure 4 in hooklying x 1 min ea with PT Piriformis stretch x 2 min with PT Hooklying trunk rotation x 10 ea, 10 second holds Supine hooklying hip ER/abd with PT MRE x 10, with black TB x10  Therapeutic Activities Supine sciatic n glide with PT manual assist x 10 and then instructed pt how to perform, practiced 10x, discussed why  this was important and how ot recreate at home  HEP instruction- see medbridge                                                                                                                               PATIENT EDUCATION:  Education details: PT POC/goals, HEP Person educated: Patient Education method: Explanation Education comprehension: verbalized understanding  HOME EXERCISE PROGRAM: Access Code: [email or web address] URL: [email or web address] Date: 11/06/2023 Prepared by: Vernell Reges  Exercises - Hooklying Single Knee to Chest Stretch  - 1-2 x daily - 7 x weekly - 3 sets - 10 reps - Supine Piriformis Stretch with Foot on Ground  - 1-2 x daily - 7 x weekly - 5 reps - 20 hold - Supine Lower Trunk Rotation  - 1-2 x daily - 7 x weekly - 1 sets - 10 reps - Supine 90/90 Sciatic Nerve Glide with Knee Flexion/Extension  - 1-2 x daily - 7 x weekly - 1 sets - 10 reps - Hooklying Clamshell with Resistance  - 1-2 x daily - 7 x weekly - 2 sets - 10 reps - 5 hold - Sit to Stand Without Arm Support  - 2 x daily - 7 x weekly - 3 sets - 5 reps  ASSESSMENT:  CLINICAL IMPRESSION: Patient is a 83 y.o. F who was seen today for physical therapy treatment for R L4 radiculopathy which seems to be improving since she underwent an epidural steroid injection on 10/11/23.  She was able to tolerate initiation of therapeutic exercises and activities to address neural tension, LE mobility and LE strength deficits.  Modified Oswestry LBP Questionnaire score is 23/50 today.  Updated goals accordingly.  She tolerated session well and a HEP was initiated.  OBJECTIVE IMPAIRMENTS: decreased activity tolerance, decreased balance, decreased mobility, difficulty walking, decreased ROM, and decreased strength.   ACTIVITY LIMITATIONS: lifting, standing, squatting, transfers, and locomotion level  PARTICIPATION LIMITATIONS: meal prep, cleaning, laundry, shopping, and community activity  PERSONAL FACTORS: Age and  Time since onset of injury/illness/exacerbation are also affecting patient's functional outcome.   REHAB POTENTIAL: Good  CLINICAL DECISION MAKING: Stable/uncomplicated  EVALUATION COMPLEXITY: Low   GOALS: Goals reviewed with patient? Yes  SHORT TERM GOALS: Target date: 11/17/23  Pt will be able to tolerate standing x 20 min for meal prep in her kitchen without rest break Baseline: 3 min Goal status: INITIAL    LONG TERM GOALS: Target date: 12/15/23  Improve MODI score >10% indicating pt able to perform her daily activities without functionally being a significantly limited by her lower back sx Baseline: to be administered at visit 2; 6/30: 23/50 Goal status: INITIAL  2.  Improve 5x STS to <15 seconds indicating pt's LE strength and risk for falls have improved significantly Baseline: 22 sec Goal status: INITIAL  3.  Improve R SLR 10 deg without onset of R LE sx to facilitate pt being able to ambulate and perform her daily activities at home without being limited by R LE sx Baseline: 50 deg, lateral LE sx Goal status: INITIAL   PLAN:  PT FREQUENCY: 2x/week  PT DURATION: 6 weeks  PLANNED INTERVENTIONS: 97110-Therapeutic exercises, 97530- Therapeutic activity, [id number]- Neuromuscular re-education, 97535- Self Care, and [id number]- Manual therapy.  PLAN FOR NEXT SESSION: MODI, LE mobility/strength, core mm retraining, HEP initiation  Vernell Reges, PT, DPT, OCS   Vernell FORBES Reges, PT 11/06/2023, 10:18 PM

## 2023-11-07 ENCOUNTER — Encounter: Payer: Self-pay | Admitting: Student in an Organized Health Care Education/Training Program

## 2023-11-07 ENCOUNTER — Ambulatory Visit
Payer: Medicare (Managed Care) | Attending: Student in an Organized Health Care Education/Training Program | Admitting: Student in an Organized Health Care Education/Training Program

## 2023-11-07 VITALS — BP 144/65 | HR 87 | Temp 97.5°F | Resp 16 | Ht 66.5 in | Wt 188.0 lb

## 2023-11-07 DIAGNOSIS — M48062 Spinal stenosis, lumbar region with neurogenic claudication: Secondary | ICD-10-CM | POA: Diagnosis present

## 2023-11-07 DIAGNOSIS — M5416 Radiculopathy, lumbar region: Secondary | ICD-10-CM | POA: Insufficient documentation

## 2023-11-07 DIAGNOSIS — M48061 Spinal stenosis, lumbar region without neurogenic claudication: Secondary | ICD-10-CM | POA: Diagnosis present

## 2023-11-07 NOTE — Progress Notes (Signed)
 PROVIDER NOTE: Interpretation of information contained herein should be left to medically-trained personnel. Specific patient instructions are provided elsewhere under Patient Instructions section of medical record. This document was created in part using AI and STT-dictation technology, any transcriptional errors that may result from this process are unintentional.  Patient: Molly Ruiz  Service: E/M   PCP: Justus Leita DEL, MD  DOB: 08/05/1940  DOS: 11/07/2023  Provider: Wallie Sherry, MD  MRN: [id number]  Delivery: Face-to-face  Specialty: Interventional Pain Management  Type: Established Patient  Setting: Ambulatory outpatient facility  Specialty designation: 09  Referring Prov.: Justus Leita DEL, MD  Location: Outpatient office facility       History of present illness (HPI) [name], a 83 y.o. year old female, is here today because of her Lumbar radiculopathy [M54.16]. Ms. Ballester primary complain today is Back Pain and Hip Pain (right)   Pain Assessment: Severity of Chronic pain is reported as a 2 /10. Location: Hip Right/tingling down to my toes on right side. Onset:  . Quality: Tingling. Timing:  . Modifying factor(s): procedure, PT. Vitals:  height is 5' 6.5 (1.689 m) and weight is 188 lb (85.3 kg). Her temperature is 97.5 F (36.4 C) (abnormal). Her blood pressure is 144/65 (abnormal) and her pulse is 87. Her respiration is 16 and oxygen saturation is 100%.  BMI: Estimated body mass index is 29.89 kg/m as calculated from the following:   Height as of this encounter: 5' 6.5 (1.689 m).   Weight as of this encounter: 188 lb (85.3 kg).  Last encounter: 09/26/2023. Last procedure: 10/11/2023.  Reason for encounter:   Post-Procedure Evaluation   Procedure: Lumbar trans-foraminal epidural steroid injection (L-TFESI) #1  Laterality: Right (-RT)  Level: L4 nerve root(s) Imaging: Fluoroscopy-guided         Anesthesia: Local anesthesia (1-2% Lidocaine ) DOS: 10/11/2023   Performed by: Wallie Sherry, MD  Purpose: Diagnostic/Therapeutic Indications: Lumbar radicular pain severe enough to impact quality of life or function. 1. Lumbar radiculopathy   2. Foraminal stenosis of lumbar region   3. Spinal stenosis, lumbar region, with neurogenic claudication    NAS-11 Pain score:   Pre-procedure: 2/10   Post-procedure: 0-No pain/10     Effectiveness:  Initial hour after procedure: 100 %  Subsequent 4-6 hours post-procedure: 80 %  Analgesia past initial 6 hours: 70 % (on average, there are still some days that are better than others)  Ongoing improvement:  Analgesic:  70% Function: Somewhat improved ROM: Somewhat improved    History of Present Illness   Molly Ruiz is an 83 year old female who presents with leg pain after a right transforaminal epidural injection.  She experiences significant improvement in the pain radiating into her leg, with her pain score now decreased to a two. She reports approximately 70% pain relief following the right transforaminal epidural injection.  Her mobility has improved, allowing her to walk better and occasionally without her cane.  Regarding her sleep, she sleeps well for two nights, but on the third night, she might wake up and stay awake for half of the night.       ROS  Constitutional: Denies any fever or chills Gastrointestinal: No reported hemesis, hematochezia, vomiting, or acute GI distress Musculoskeletal: Denies any acute onset joint swelling, redness, loss of ROM, or weakness Neurological: No reported episodes of acute onset apraxia, aphasia, dysarthria, agnosia, amnesia, paralysis, loss of coordination, or loss of consciousness  Medication Review  Bisacodyl, Blood Glucose Monitoring Suppl, Calcium   Citrate, Fish Oil, Multiple Vitamins-Minerals, NON FORMULARY, OneTouch Delica Lancets 33G, Polyvinyl Alcohol-Povidone PF, acetaminophen , amLODipine , buPROPion , cholecalciferol, ezetimibe , famotidine ,  glucose blood, irbesartan , meloxicam , mupirocin  ointment, nystatin  cream, and nystatin -triamcinolone  ointment  History Review  Allergy: Ms. Lamprecht is allergic to atorvastatin  and influenza vaccines. Drug: Ms. Rennert  reports no history of drug use. Alcohol:  reports no history of alcohol use. Tobacco:  reports that she has never smoked. She has never used smokeless tobacco. Social: Ms. Lahman  reports that she has never smoked. She has never used smokeless tobacco. She reports that she does not drink alcohol and does not use drugs. Medical:  has a past medical history of Complication of anesthesia, Depression, Diabetes mellitus without complication (HCC), Hyperlipidemia, Hypertension, Sleep apnea, and Wears dentures. Surgical: Ms. Bircher  has a past surgical history that includes Abdominal hysterectomy; Bilateral carpal tunnel release; Foot surgery; Lumbar disc surgery (1998); Cervical fusion (2004); Cataract extraction w/PHACO (Right, 11/12/2018); Colonoscopy with propofol  (N/A, 07/22/2019); polypectomy (N/A, 07/22/2019); and Hand surgery (Left, 06/2022). Family: family history includes Breast cancer (age of onset: 36) in an other family member; Diabetes in her mother; Heart disease in her sister; Prostate cancer in her brother; Stroke in her brother and mother.  Laboratory Chemistry Profile   Renal Lab Results  Component Value Date   BUN 11 09/28/2023   CREATININE 0.83 09/28/2023   BCR 16 03/13/2023   GFRAA 63 11/25/2019   GFRNONAA >60 09/28/2023    Hepatic Lab Results  Component Value Date   AST 21 11/08/2022   ALT 13 11/08/2022   ALBUMIN 4.2 11/08/2022   ALKPHOS 123 (H) 11/08/2022    Electrolytes Lab Results  Component Value Date   NA 138 09/28/2023   K 4.4 09/28/2023   CL 102 09/28/2023   CALCIUM  9.7 09/28/2023    Bone Lab Results  Component Value Date   VD25OH 37.3 11/02/2021    Inflammation (CRP: Acute Phase) (ESR: Chronic Phase) No results found for: CRP,  ESRSEDRATE, LATICACIDVEN       Note: Above Lab results reviewed.   Physical Exam  Vitals: BP (!) 144/65   Pulse 87   Temp (!) 97.5 F (36.4 C)   Resp 16   Ht 5' 6.5 (1.689 m)   Wt 188 lb (85.3 kg)   SpO2 100%   BMI 29.89 kg/m  BMI: Estimated body mass index is 29.89 kg/m as calculated from the following:   Height as of this encounter: 5' 6.5 (1.689 m).   Weight as of this encounter: 188 lb (85.3 kg). Ideal: Ideal body weight: 60.5 kg (133 lb 4.3 oz) Adjusted ideal body weight: 70.4 kg (155 lb 2.6 oz) General appearance: Well nourished, well developed, and well hydrated. In no apparent acute distress Mental status: Alert, oriented x 3 (person, place, & time)       Respiratory: No evidence of acute respiratory distress Eyes: PERLA  Improvement in radiating right leg pain Assessment   Diagnosis Status  1. Lumbar radiculopathy   2. Foraminal stenosis of lumbar region   3. Spinal stenosis, lumbar region, with neurogenic claudication    Controlled Controlled Controlled   Updated Problems: No problems updated.  Plan of Care  Assessment and Plan    Pain in right leg, lumbar radicular pain Significant improvement in pain radiating into the right leg after a right transforaminal epidural injection, with approximately 70% pain relief and a decreased pain score of 2. Mobility has improved, allowing her to walk without a cane on some  days. Sleep is generally good, though occasionally disrupted. Discuss repeating the injection if symptoms worsen.         Return if symptoms worsen or fail to improve.    Recent Visits Date Type Provider Dept  10/11/23 Procedure visit Marcelino Nurse, MD [facility]  09/26/23 Office Visit Marcelino Nurse, MD [facility]  Showing recent visits within past 90 days and meeting all other requirements Today's Visits Date Type Provider Dept  11/07/23 Office Visit Marcelino Nurse, MD [facility]  Showing today's  visits and meeting all other requirements Future Appointments No visits were found meeting these conditions. Showing future appointments within next 90 days and meeting all other requirements  I discussed the assessment and treatment plan with the patient. The patient was provided an opportunity to ask questions and all were answered. The patient agreed with the plan and demonstrated an understanding of the instructions.  Patient advised to call back or seek an in-person evaluation if the symptoms or condition worsens.  Duration of encounter: .  Total time on encounter, as per AMA guidelines included both the face-to-face and non-face-to-face time personally spent by the physician and/or other qualified health care professional(s) on the day of the encounter (includes time in activities that require the physician or other qualified health care professional and does not include time in activities normally performed by clinical staff). Physician's time may include the following activities when performed: Preparing to see the patient (e.g., pre-charting review of records, searching for previously ordered imaging, lab work, and nerve conduction tests) Review of prior analgesic pharmacotherapies. Reviewing PMP Interpreting ordered tests (e.g., lab work, imaging, nerve conduction tests) Performing post-procedure evaluations, including interpretation of diagnostic procedures Obtaining and/or reviewing separately obtained history Performing a medically appropriate examination and/or evaluation Counseling and educating the patient/family/caregiver Ordering medications, tests, or procedures Referring and communicating with other health care professionals (when not separately reported) Documenting clinical information in the electronic or other health record Independently interpreting results (not separately reported) and communicating results to the patient/ family/caregiver Care coordination  (not separately reported)  Note by: Nurse Marcelino, MD (TTS and AI technology used. I apologize for any typographical errors that were not detected and corrected.) Date: 11/07/2023; Time: 10:41 AM

## 2023-11-07 NOTE — Progress Notes (Signed)
 Safety precautions to be maintained throughout the outpatient stay will include: orient to surroundings, keep bed in low position, maintain call bell within reach at all times, provide assistance with transfer out of bed and ambulation.

## 2023-11-13 ENCOUNTER — Encounter: Payer: Medicare (Managed Care) | Admitting: Internal Medicine

## 2023-11-14 NOTE — Therapy (Signed)
 OUTPATIENT PHYSICAL THERAPY THORACOLUMBAR TREATMENT   Patient Name: Molly Ruiz MRN: [id number] DOB:1941-03-12, 83 y.o., female Today's Date: 11/15/2023  END OF SESSION:  PT End of Session - 11/15/23 1411     Visit Number 3    Number of Visits 13    Date for PT Re-Evaluation 12/15/23    Authorization Type 2x/week x 6 weeks    PT Start Time 1415    PT Stop Time 1456    PT Time Calculation (min) 41 min    Activity Tolerance Patient tolerated treatment well    Behavior During Therapy WFL for tasks assessed/performed           Past Medical History:  Diagnosis Date   Complication of anesthesia    Hair falls out.   Depression    Diabetes mellitus without complication (HCC)    Hyperlipidemia    Hypertension    Sleep apnea    CPAP   Wears dentures    partial upper   Past Surgical History:  Procedure Laterality Date   ABDOMINAL HYSTERECTOMY     BILATERAL CARPAL TUNNEL RELEASE     CATARACT EXTRACTION W/PHACO Right 11/12/2018   Procedure: CATARACT EXTRACTION PHACO AND INTRAOCULAR LENS PLACEMENT (IOC)  RIGHT DIABETIC;  Surgeon: Myrna Adine Anes, MD;  Location: [facility];  Service: Ophthalmology;  Laterality: Right;  Diabetic - diet controlled sleep apnea   CERVICAL FUSION  2004   COLONOSCOPY WITH PROPOFOL  N/A 07/22/2019   Procedure: COLONOSCOPY WITH BIOPSIES;  Surgeon: Jinny Carmine, MD;  Location: [facility];  Service: Endoscopy;  Laterality: N/A;  Diabetic - diet controlled priority 4   FOOT SURGERY     HAND SURGERY Left 06/2022   palmar cyst removed   LUMBAR DISC SURGERY  1998   POLYPECTOMY N/A 07/22/2019   Procedure: POLYPECTOMY;  Surgeon: Jinny Carmine, MD;  Location: [facility];  Service: Endoscopy;  Laterality: N/A;   Patient Active Problem List   Diagnosis Date Noted   Lumbar radiculopathy 09/26/2023   Foraminal stenosis of lumbar region 09/26/2023   Spinal stenosis, lumbar region, with neurogenic claudication 09/26/2023   Acute  right-sided low back pain with right-sided sciatica 08/25/2023   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 03/09/2022   Stage 3a chronic kidney disease (HCC) 11/29/2021   Acute pain of right shoulder 09/24/2020   Statin myopathy 03/31/2020   Gastroesophageal reflux disease 09/19/2019   Special screening for malignant neoplasms, colon    Tubular adenoma of colon    Anxiety disorder 06/13/2019   Osteopenia determined by x-ray 06/06/2018   Essential hypertension 04/26/2018   Eczema 10/25/2017   Slow transit constipation 02/06/2017   Primary osteoarthritis of left hip 01/12/2017   OSA on CPAP 01/02/2017   Type II diabetes mellitus with complication (HCC) 06/24/2016   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   Hyperlipidemia associated with type 2 diabetes mellitus (HCC) 06/24/2016   Obesity (BMI 30.0-34.9) 06/24/2016   Vitamin D  deficiency 06/24/2016   History of shingles 06/24/2016   Allergic rhinitis 06/24/2016   PCP: Leita Adie, MD  REFERRING PROVIDER: Lyle Decamp, [facility]  REFERRING DIAG: [phone] (ICD-10-CM) - Acute lumbar radiculopathy   Rationale for Evaluation and Treatment: Rehabilitation  THERAPY DIAG:  Lumbar radiculopathy  ONSET DATE: 1 month ago  SUBJECTIVE:  SUBJECTIVE STATEMENT: Pt reports tightness/soreness in R lateral/posterior leg.  Was having low back pain and had an epidural steroid injection (R L4 nerve root) a few weeks ago (10/11/23), this has helped significantly with sx.  Has noticed her standing/walking tolerance is limited due to her sx.  Prior to injection her standing tolerance was 3 min a time, had to shower sitting on her shower chair and had to take breaks often while cooking/meal prep.  These are improving.  PERTINENT HISTORY:   Spinal stenosis-  difficulty walking, has been using a cane during recent flare up until just a few days ago; prior to injection was having R low back and leg pain/parasthesias to foot  She reports having cervical spine fusion surgery >20 years ago    PAIN:  Are you having pain? Currently 0/10   PRECAUTIONS: None  RED FLAGS: None   WEIGHT BEARING RESTRICTIONS: No  FALLS:  Has patient fallen in last 6 months? Yes. 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   NEXT MD VISIT: 11/07/23  OBJECTIVE:  Note: Objective measures were completed at Evaluation unless otherwise noted.  DIAGNOSTIC FINDINGS:  Per chart review, April 2025 MRI lumbar spine IMPRESSION: 1. 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 Bulging of the disc with biforaminal protrusions. Severe multifactorial stenosis at the disc level. Bilateral foraminal stenosis could compress either or both L4 nerves. The facet arthritis would likely be painful. 2. L2-3: Disc bulge. Facet and ligamentous hypertrophy. Mild multifactorial stenosis but without evidence of focal neural compression. 3. L3-4: Disc bulge. Facet and ligamentous hypertrophy. Mild stenosis of both lateral recesses but no definite neural compression. 4. L5-S1: Disc bulge. Mild facet degeneration. Mild proximal foraminal stenosis but without definite compression of the exiting L5 nerves.      PATIENT SURVEYS:  MODI: at visit #2  COGNITION: Overall cognitive status: Within functional limits for tasks assessed     SENSATION: WFL  MUSCLE LENGTH: (+) R SLR for limited mobility at 50  deg compared to L at 60 deg, no parasthesias reported  POSTURE: rounded shoulders  PALPATION: TTP R gluteal mm  LUMBAR ROM:   AROM eval  Flexion Mod limited  Extension Mod limited  Right lateral flexion Mod limited and painful  Left lateral flexion Mod limited  Right rotation   Left rotation    (Blank rows = not tested)  LOWER EXTREMITY ROM:     Active  Right eval Left eval  Hip flexion 110 110  Hip extension    Hip abduction    Hip adduction    Hip internal rotation    Hip external rotation 40 40  Knee flexion 130 130  Knee extension 0 0  Ankle dorsiflexion    Ankle plantarflexion    Ankle inversion    Ankle eversion     (Blank rows = not tested)  LOWER EXTREMITY MMT:    MMT Right eval Left eval  Hip flexion 4+ 4+  Hip extension    Hip abduction 4 4  Hip adduction    Hip internal rotation    Hip external rotation    Knee flexion  5 5  Knee extension 4 4+  Ankle dorsiflexion 4 5  Ankle plantarflexion 4 5  Ankle inversion    Ankle eversion     (Blank rows = not tested)  LUMBAR SPECIAL TESTS:  (+) SLR R for limited mobility and R lateral LE sx (thigh)  FUNCTIONAL TESTS:  5 times sit to stand: 22 seconds  GAIT: Amb without any AD into clinic today  TREATMENT DATE: 11/15/23  Subjective: Patient reports she feels that her R hip is cramping this PM. Continues to feel tingling from R knee to ankle  Pain: 3/10 R hip and knee   Objective:  Therapeutic Exercises: Nustep x level 3 x 10 minutes Supine lower trunk rotation x 10 each side   SKTC x 4 ea, 20 second hold, with PT Hip ER figure 4 in hooklying x 1 min ea with PT Piriformis stretch x 2 min with PT Hooklying bridge 2 x 10 Hooklying marching with black TB 2 x 10 each LE  Hooklying hip abduction with black TB 2 x 10  Seated marching 2 x 10 with cues for core activation  Sit to stand 2 x 10 with no UE support    PATIENT EDUCATION:  Education details: PT POC/goals, HEP Person educated:  Patient Education method: Explanation Education comprehension: verbalized understanding  HOME EXERCISE PROGRAM: Access Code: [email or web address] URL: [email or web address] Date: 11/06/2023 Prepared by: Vernell Reges  Exercises - Hooklying Single Knee to Chest Stretch  - 1-2 x daily - 7 x weekly - 3 sets - 10 reps - Supine Piriformis Stretch with Foot on Ground  - 1-2 x daily - 7 x weekly - 5 reps - 20 hold - Supine Lower Trunk Rotation  - 1-2 x daily - 7 x weekly - 1 sets - 10 reps - Supine 90/90 Sciatic Nerve Glide with Knee Flexion/Extension  - 1-2 x daily - 7 x weekly - 1 sets - 10 reps - Hooklying Clamshell with Resistance  - 1-2 x daily - 7 x weekly - 2 sets - 10 reps - 5 hold - Sit to Stand Without Arm Support  - 2 x daily - 7 x weekly - 3 sets - 5 reps  ASSESSMENT:  CLINICAL IMPRESSION:  Session focused general hip stretching and core strengthening. Patient complaining of increased R hip discomfort on arrival which improved to 0/10 at end of session. Tolerated session well with difficulty in seated core exercises. Patient will continue to benefit from skilled therapy to address remaining deficits in order to improve quality of life and return to PLOF.     OBJECTIVE IMPAIRMENTS: decreased activity tolerance, decreased balance, decreased mobility, difficulty walking, decreased ROM, and decreased strength.   ACTIVITY LIMITATIONS: lifting, standing, squatting, transfers, and locomotion level  PARTICIPATION LIMITATIONS: meal prep, cleaning, laundry, shopping, and community activity  PERSONAL FACTORS: Age and Time since onset of injury/illness/exacerbation are also affecting patient's functional outcome.   REHAB POTENTIAL: Good  CLINICAL DECISION MAKING: Stable/uncomplicated  EVALUATION COMPLEXITY: Low   GOALS: Goals reviewed with patient? Yes  SHORT TERM GOALS: Target date: 11/17/23  Pt will be able to tolerate standing x 20 min for meal prep in her kitchen without  rest break Baseline: 3 min Goal status: INITIAL    LONG TERM GOALS: Target date: 12/15/23  Improve MODI score >10% indicating pt able to perform her daily activities without functionally being a significantly limited by her lower back sx Baseline: to be administered at visit 2; 6/30: 23/50 Goal status: INITIAL  2.  Improve 5x STS to <15 seconds indicating pt's LE strength and risk for falls have improved significantly Baseline: 22 sec Goal status: INITIAL  3.  Improve R SLR 10 deg without onset of R LE sx to facilitate pt being able to ambulate and perform her daily activities at home without being limited by R LE sx Baseline: 50 deg, lateral LE sx Goal status: INITIAL   PLAN:  PT FREQUENCY: 2x/week  PT DURATION: 6 weeks  PLANNED INTERVENTIONS: 97110-Therapeutic exercises, 97530- Therapeutic activity, [id number]- Neuromuscular re-education, 97535- Self Care, and [id number]- Manual therapy.  PLAN FOR NEXT SESSION: LE mobility/strength, core mm retraining, HEP initiation  Bobbette Eakes, PT, DPT Physical Therapist - [facility]  [facility]  Maryanne DELENA Finder, PT 11/15/2023, 2:11 PM

## 2023-11-15 ENCOUNTER — Ambulatory Visit: Payer: Medicare (Managed Care) | Attending: Physician Assistant

## 2023-11-15 DIAGNOSIS — M5416 Radiculopathy, lumbar region: Secondary | ICD-10-CM | POA: Insufficient documentation

## 2023-11-15 DIAGNOSIS — H524 Presbyopia: Secondary | ICD-10-CM | POA: Diagnosis not present

## 2023-11-15 DIAGNOSIS — H52223 Regular astigmatism, bilateral: Secondary | ICD-10-CM | POA: Diagnosis not present

## 2023-11-16 DIAGNOSIS — G4733 Obstructive sleep apnea (adult) (pediatric): Secondary | ICD-10-CM | POA: Diagnosis not present

## 2023-11-20 ENCOUNTER — Ambulatory Visit: Payer: Medicare (Managed Care)

## 2023-11-20 ENCOUNTER — Telehealth: Payer: Self-pay

## 2023-11-20 DIAGNOSIS — M5416 Radiculopathy, lumbar region: Secondary | ICD-10-CM | POA: Diagnosis not present

## 2023-11-20 NOTE — Telephone Encounter (Signed)
 Patient canceled CPE due to weather last week. Pls call and reschedule CPE with Dr Justus.  - Kinslei Labine M.

## 2023-11-20 NOTE — Therapy (Signed)
 OUTPATIENT PHYSICAL THERAPY THORACOLUMBAR TREATMENT   Patient Name: Molly Ruiz MRN: [id number] DOB:02-27-1941, 83 y.o., female Today's Date: 11/20/2023  END OF SESSION:  PT End of Session - 11/20/23 1447     Visit Number 4    Number of Visits 13    Date for PT Re-Evaluation 12/15/23    Authorization Type 2x/week x 6 weeks    PT Start Time 1245    PT Stop Time 1330    PT Time Calculation (min) 45 min    Activity Tolerance Patient tolerated treatment well    Behavior During Therapy WFL for tasks assessed/performed           Past Medical History:  Diagnosis Date   Complication of anesthesia    Hair falls out.   Depression    Diabetes mellitus without complication (HCC)    Hyperlipidemia    Hypertension    Sleep apnea    CPAP   Wears dentures    partial upper   Past Surgical History:  Procedure Laterality Date   ABDOMINAL HYSTERECTOMY     BILATERAL CARPAL TUNNEL RELEASE     CATARACT EXTRACTION W/PHACO Right 11/12/2018   Procedure: CATARACT EXTRACTION PHACO AND INTRAOCULAR LENS PLACEMENT (IOC)  RIGHT DIABETIC;  Surgeon: Myrna Adine Anes, MD;  Location: [facility];  Service: Ophthalmology;  Laterality: Right;  Diabetic - diet controlled sleep apnea   CERVICAL FUSION  2004   COLONOSCOPY WITH PROPOFOL  N/A 07/22/2019   Procedure: COLONOSCOPY WITH BIOPSIES;  Surgeon: Jinny Carmine, MD;  Location: [facility];  Service: Endoscopy;  Laterality: N/A;  Diabetic - diet controlled priority 4   FOOT SURGERY     HAND SURGERY Left 06/2022   palmar cyst removed   LUMBAR DISC SURGERY  1998   POLYPECTOMY N/A 07/22/2019   Procedure: POLYPECTOMY;  Surgeon: Jinny Carmine, MD;  Location: [facility];  Service: Endoscopy;  Laterality: N/A;   Patient Active Problem List   Diagnosis Date Noted   Lumbar radiculopathy 09/26/2023   Foraminal stenosis of lumbar region 09/26/2023   Spinal stenosis, lumbar region, with neurogenic claudication 09/26/2023   Acute  right-sided low back pain with right-sided sciatica 08/25/2023   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 03/09/2022   Stage 3a chronic kidney disease (HCC) 11/29/2021   Acute pain of right shoulder 09/24/2020   Statin myopathy 03/31/2020   Gastroesophageal reflux disease 09/19/2019   Special screening for malignant neoplasms, colon    Tubular adenoma of colon    Anxiety disorder 06/13/2019   Osteopenia determined by x-ray 06/06/2018   Essential hypertension 04/26/2018   Eczema 10/25/2017   Slow transit constipation 02/06/2017   Primary osteoarthritis of left hip 01/12/2017   OSA on CPAP 01/02/2017   Type II diabetes mellitus with complication (HCC) 06/24/2016   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   Hyperlipidemia associated with type 2 diabetes mellitus (HCC) 06/24/2016   Obesity (BMI 30.0-34.9) 06/24/2016   Vitamin D  deficiency 06/24/2016   History of shingles 06/24/2016   Allergic rhinitis 06/24/2016   PCP: Leita Adie, MD  REFERRING PROVIDER: Lyle Decamp, [facility]  REFERRING DIAG: [phone] (ICD-10-CM) - Acute lumbar radiculopathy   Rationale for Evaluation and Treatment: Rehabilitation  THERAPY DIAG:  Lumbar radiculopathy  ONSET DATE: 1 month ago  SUBJECTIVE:  SUBJECTIVE STATEMENT: Pt reports tightness/soreness in R lateral/posterior leg.  Was having low back pain and had an epidural steroid injection (R L4 nerve root) a few weeks ago (10/11/23), this has helped significantly with sx.  Has noticed her standing/walking tolerance is limited due to her sx.  Prior to injection her standing tolerance was 3 min a time, had to shower sitting on her shower chair and had to take breaks often while cooking/meal prep.  These are improving.  PERTINENT HISTORY:   Spinal stenosis-  difficulty walking, has been using a cane during recent flare up until just a few days ago; prior to injection was having R low back and leg pain/parasthesias to foot  She reports having cervical spine fusion surgery >20 years ago    PAIN:  Are you having pain? Currently 0/10   PRECAUTIONS: None  RED FLAGS: None   WEIGHT BEARING RESTRICTIONS: No  FALLS:  Has patient fallen in last 6 months? Yes. 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   NEXT MD VISIT: 11/07/23  OBJECTIVE:  Note: Objective measures were completed at Evaluation unless otherwise noted.  DIAGNOSTIC FINDINGS:  Per chart review, April 2025 MRI lumbar spine IMPRESSION: 1. 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 Bulging of the disc with biforaminal protrusions. Severe multifactorial stenosis at the disc level. Bilateral foraminal stenosis could compress either or both L4 nerves. The facet arthritis would likely be painful. 2. L2-3: Disc bulge. Facet and ligamentous hypertrophy. Mild multifactorial stenosis but without evidence of focal neural compression. 3. L3-4: Disc bulge. Facet and ligamentous hypertrophy. Mild stenosis of both lateral recesses but no definite neural compression. 4. L5-S1: Disc bulge. Mild facet degeneration. Mild proximal foraminal stenosis but without definite compression of the exiting L5 nerves.      PATIENT SURVEYS:  MODI: at visit #2  COGNITION: Overall cognitive status: Within functional limits for tasks assessed     SENSATION: WFL  MUSCLE LENGTH: (+) R SLR for limited mobility at 50  deg compared to L at 60 deg, no parasthesias reported  POSTURE: rounded shoulders  PALPATION: TTP R gluteal mm  LUMBAR ROM:   AROM eval  Flexion Mod limited  Extension Mod limited  Right lateral flexion Mod limited and painful  Left lateral flexion Mod limited  Right rotation   Left rotation    (Blank rows = not tested)  LOWER EXTREMITY ROM:     Active  Right eval Left eval  Hip flexion 110 110  Hip extension    Hip abduction    Hip adduction    Hip internal rotation    Hip external rotation 40 40  Knee flexion 130 130  Knee extension 0 0  Ankle dorsiflexion    Ankle plantarflexion    Ankle inversion    Ankle eversion     (Blank rows = not tested)  LOWER EXTREMITY MMT:    MMT Right eval Left eval  Hip flexion 4+ 4+  Hip extension    Hip abduction 4 4  Hip adduction    Hip internal rotation    Hip external rotation    Knee flexion  5 5  Knee extension 4 4+  Ankle dorsiflexion 4 5  Ankle plantarflexion 4 5  Ankle inversion    Ankle eversion     (Blank rows = not tested)  LUMBAR SPECIAL TESTS:  (+) SLR R for limited mobility and R lateral LE sx (thigh)  FUNCTIONAL TESTS:  5 times sit to stand: 22 seconds  GAIT: Amb without any AD into clinic today  TREATMENT DATE: 11/20/23  Subjective: Patient reports she is wearing her soft back brace today because this provides support when her back feels weak.  It felt weak this morning.  Sometimes it feels weak with standing, standing activities.  She reports no sx in R LE upon arrival.  Pain: 0/10 R hip and knee   Objective:  Therapeutic Exercises: Nustep x level 3 x 10 minutes- not today Supine lower trunk rotation x 10 each side   SKTC x 4 ea, 20 second hold, with PT Hip ER figure 4 in hooklying x 1 min ea with PT Piriformis stretch x 2 min with PT Hooklying bridge 4x5 Hooklying marching with black TB 2 x 10 each LE - not today Hooklying hip abduction with black TB 2 x 10 - not today Seated  marching 2 x 10 with cues for core activation  Sit to stand 2 x 10 with no UE support   Therapeutic Activtities: Pt ed for purpose of TA muscle activation, functionally the role of this postural mm as part of the core mm system during her daily activities Supine hooklying TA activation: x20 Sitting TA activation: x20 Sit to stand with TA activation: x10   PATIENT EDUCATION:  Education details: PT POC/goals, HEP Person educated: Patient Education method: Explanation Education comprehension: verbalized understanding  HOME EXERCISE PROGRAM: Access Code: [email or web address] URL: [email or web address] Date: 11/06/2023 Prepared by: Vernell Reges  Exercises - Hooklying Single Knee to Chest Stretch  - 1-2 x daily - 7 x weekly - 3 sets - 10 reps - Supine Piriformis Stretch with Foot on Ground  - 1-2 x daily - 7 x weekly - 5 reps - 20 hold - Supine Lower Trunk Rotation  - 1-2 x daily - 7 x weekly - 1 sets - 10 reps - Supine 90/90 Sciatic Nerve Glide with Knee Flexion/Extension  - 1-2 x daily - 7 x weekly - 1 sets - 10 reps - Hooklying Clamshell with Resistance  - 1-2 x daily - 7 x weekly - 2 sets - 10 reps - 5 hold - Sit to Stand Without Arm Support  - 2 x daily - 7 x weekly - 3 sets - 5 reps  ASSESSMENT:  CLINICAL IMPRESSION:  Initiated TA mm retraining into tx plan today to address pt's report of her back feeling weak.  Would benefit from continuing to incorporate core mm retraining functionally into her course of PT to promote improved ability to stand/walk/perform her housework at home.  No onset of R LE sx during session today.  Bridges remain very difficult for her today.  Patient will continue to benefit from skilled therapy to address remaining deficits in order to improve quality of life and return to PLOF.     OBJECTIVE IMPAIRMENTS: decreased activity tolerance, decreased balance, decreased mobility, difficulty walking, decreased ROM, and decreased strength.   ACTIVITY  LIMITATIONS: lifting, standing, squatting, transfers, and locomotion level  PARTICIPATION LIMITATIONS: meal prep, cleaning, laundry, shopping, and community activity  PERSONAL FACTORS: Age and Time since onset of injury/illness/exacerbation are also affecting patient's functional outcome.  REHAB POTENTIAL: Good  CLINICAL DECISION MAKING: Stable/uncomplicated  EVALUATION COMPLEXITY: Low   GOALS: Goals reviewed with patient? Yes  SHORT TERM GOALS: Target date: 11/17/23  Pt will be able to tolerate standing x 20 min for meal prep in her kitchen without rest break Baseline: 3 min Goal status: INITIAL    LONG TERM GOALS: Target date: 12/15/23  Improve MODI score >10% indicating pt able to perform her daily activities without functionally being a significantly limited by her lower back sx Baseline: to be administered at visit 2; 6/30: 23/50 Goal status: INITIAL  2.  Improve 5x STS to <15 seconds indicating pt's LE strength and risk for falls have improved significantly Baseline: 22 sec Goal status: INITIAL  3.  Improve R SLR 10 deg without onset of R LE sx to facilitate pt being able to ambulate and perform her daily activities at home without being limited by R LE sx Baseline: 50 deg, lateral LE sx Goal status: INITIAL   PLAN:  PT FREQUENCY: 2x/week  PT DURATION: 6 weeks  PLANNED INTERVENTIONS: 97110-Therapeutic exercises, 97530- Therapeutic activity, [id number]- Neuromuscular re-education, 97535- Self Care, and [id number]- Manual therapy.  PLAN FOR NEXT SESSION: LE mobility/strength, core mm retraining, HEP initiation  Vernell Reges, PT, DPT, OCS  #[id number]   Vernell FORBES Reges, PT 11/20/2023, 2:48 PM

## 2023-11-27 ENCOUNTER — Ambulatory Visit: Payer: Medicare (Managed Care)

## 2023-11-27 DIAGNOSIS — M5416 Radiculopathy, lumbar region: Secondary | ICD-10-CM

## 2023-11-27 NOTE — Therapy (Signed)
 OUTPATIENT PHYSICAL THERAPY THORACOLUMBAR TREATMENT   Patient Name: Molly Ruiz MRN: [id number] DOB:1940-07-29, 83 y.o., female Today's Date: 11/27/2023  END OF SESSION:  PT End of Session - 11/27/23 1233     Visit Number 5    Number of Visits 13    Date for PT Re-Evaluation 12/15/23    Authorization Type 2x/week x 6 weeks    PT Start Time 1240    PT Stop Time 1325    PT Time Calculation (min) 45 min    Activity Tolerance Patient tolerated treatment well    Behavior During Therapy WFL for tasks assessed/performed           Past Medical History:  Diagnosis Date   Complication of anesthesia    Hair falls out.   Depression    Diabetes mellitus without complication (HCC)    Hyperlipidemia    Hypertension    Sleep apnea    CPAP   Wears dentures    partial upper   Past Surgical History:  Procedure Laterality Date   ABDOMINAL HYSTERECTOMY     BILATERAL CARPAL TUNNEL RELEASE     CATARACT EXTRACTION W/PHACO Right 11/12/2018   Procedure: CATARACT EXTRACTION PHACO AND INTRAOCULAR LENS PLACEMENT (IOC)  RIGHT DIABETIC;  Surgeon: Myrna Adine Anes, MD;  Location: [facility];  Service: Ophthalmology;  Laterality: Right;  Diabetic - diet controlled sleep apnea   CERVICAL FUSION  2004   COLONOSCOPY WITH PROPOFOL  N/A 07/22/2019   Procedure: COLONOSCOPY WITH BIOPSIES;  Surgeon: Jinny Carmine, MD;  Location: [facility];  Service: Endoscopy;  Laterality: N/A;  Diabetic - diet controlled priority 4   FOOT SURGERY     HAND SURGERY Left 06/2022   palmar cyst removed   LUMBAR DISC SURGERY  1998   POLYPECTOMY N/A 07/22/2019   Procedure: POLYPECTOMY;  Surgeon: Jinny Carmine, MD;  Location: [facility];  Service: Endoscopy;  Laterality: N/A;   Patient Active Problem List   Diagnosis Date Noted   Lumbar radiculopathy 09/26/2023   Foraminal stenosis of lumbar region 09/26/2023   Spinal stenosis, lumbar region, with neurogenic claudication 09/26/2023   Acute  right-sided low back pain with right-sided sciatica 08/25/2023   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 03/09/2022   Stage 3a chronic kidney disease (HCC) 11/29/2021   Acute pain of right shoulder 09/24/2020   Statin myopathy 03/31/2020   Gastroesophageal reflux disease 09/19/2019   Special screening for malignant neoplasms, colon    Tubular adenoma of colon    Anxiety disorder 06/13/2019   Osteopenia determined by x-ray 06/06/2018   Essential hypertension 04/26/2018   Eczema 10/25/2017   Slow transit constipation 02/06/2017   Primary osteoarthritis of left hip 01/12/2017   OSA on CPAP 01/02/2017   Type II diabetes mellitus with complication (HCC) 06/24/2016   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   Hyperlipidemia associated with type 2 diabetes mellitus (HCC) 06/24/2016   Obesity (BMI 30.0-34.9) 06/24/2016   Vitamin D  deficiency 06/24/2016   History of shingles 06/24/2016   Allergic rhinitis 06/24/2016   PCP: Leita Adie, MD  REFERRING PROVIDER: Lyle Decamp, [facility]  REFERRING DIAG: [phone] (ICD-10-CM) - Acute lumbar radiculopathy   Rationale for Evaluation and Treatment: Rehabilitation  THERAPY DIAG:  Lumbar radiculopathy  ONSET DATE: 1 month ago  SUBJECTIVE:  SUBJECTIVE STATEMENT: Pt reports tightness/soreness in R lateral/posterior leg.  Was having low back pain and had an epidural steroid injection (R L4 nerve root) a few weeks ago (10/11/23), this has helped significantly with sx.  Has noticed her standing/walking tolerance is limited due to her sx.  Prior to injection her standing tolerance was 3 min a time, had to shower sitting on her shower chair and had to take breaks often while cooking/meal prep.  These are improving.  PERTINENT HISTORY:   Spinal stenosis-  difficulty walking, has been using a cane during recent flare up until just a few days ago; prior to injection was having R low back and leg pain/parasthesias to foot  She reports having cervical spine fusion surgery >20 years ago    PAIN:  Are you having pain? Currently 0/10   PRECAUTIONS: None  RED FLAGS: None   WEIGHT BEARING RESTRICTIONS: No  FALLS:  Has patient fallen in last 6 months? Yes. 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   NEXT MD VISIT: 11/07/23  OBJECTIVE:  Note: Objective measures were completed at Evaluation unless otherwise noted.  DIAGNOSTIC FINDINGS:  Per chart review, April 2025 MRI lumbar spine IMPRESSION: 1. 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 Bulging of the disc with biforaminal protrusions. Severe multifactorial stenosis at the disc level. Bilateral foraminal stenosis could compress either or both L4 nerves. The facet arthritis would likely be painful. 2. L2-3: Disc bulge. Facet and ligamentous hypertrophy. Mild multifactorial stenosis but without evidence of focal neural compression. 3. L3-4: Disc bulge. Facet and ligamentous hypertrophy. Mild stenosis of both lateral recesses but no definite neural compression. 4. L5-S1: Disc bulge. Mild facet degeneration. Mild proximal foraminal stenosis but without definite compression of the exiting L5 nerves.      PATIENT SURVEYS:  MODI: at visit #2  COGNITION: Overall cognitive status: Within functional limits for tasks assessed     SENSATION: WFL  MUSCLE LENGTH: (+) R SLR for limited mobility at 50  deg compared to L at 60 deg, no parasthesias reported  POSTURE: rounded shoulders  PALPATION: TTP R gluteal mm  LUMBAR ROM:   AROM eval  Flexion Mod limited  Extension Mod limited  Right lateral flexion Mod limited and painful  Left lateral flexion Mod limited  Right rotation   Left rotation    (Blank rows = not tested)  LOWER EXTREMITY ROM:     Active  Right eval Left eval  Hip flexion 110 110  Hip extension    Hip abduction    Hip adduction    Hip internal rotation    Hip external rotation 40 40  Knee flexion 130 130  Knee extension 0 0  Ankle dorsiflexion    Ankle plantarflexion    Ankle inversion    Ankle eversion     (Blank rows = not tested)  LOWER EXTREMITY MMT:    MMT Right eval Left eval  Hip flexion 4+ 4+  Hip extension    Hip abduction 4 4  Hip adduction    Hip internal rotation    Hip external rotation    Knee flexion  5 5  Knee extension 4 4+  Ankle dorsiflexion 4 5  Ankle plantarflexion 4 5  Ankle inversion    Ankle eversion     (Blank rows = not tested)  LUMBAR SPECIAL TESTS:  (+) SLR R for limited mobility and R lateral LE sx (thigh)  FUNCTIONAL TESTS:  5 times sit to stand: 22 seconds  GAIT: Amb without any AD into clinic today  TREATMENT DATE: 11/27/23  Subjective: Patient reports She did not have to wear her back brace this weekend.  Sometimes it feels weak with standing, standing activities.  She reports no sx in R LE upon arrival.  Overall she is feeling better.  Standing tolerance now is improving.     Pain: 0/10 R hip and knee   Objective:  Therapeutic Exercises: Nustep x level 4 x 10 minutes- achieved 3/10th mile today Supine lower trunk rotation x 10 each side   SKTC x 4 ea, 20 second hold, with PT Hip ER figure 4 in hooklying x 1 min ea with PT Piriformis stretch x 2 min with PT Hooklying bridge with adductor squeeze: 4x5 Hooklying abdominal brace + marches x20 Seated marching 2 x 10 with cues for core  activation Sit to stand 2 x 10 with no UE support- not today Standing hip abd 2x10 ea side, PT cues for upright trunk posture and not laterally flexing to substitute  Therapeutic Activtities: Pt ed for purpose of TA muscle activation, functionally the role of this postural mm as part of the core mm system during her daily activities Supine hooklying TA activation: x20 Sitting TA activation: x20 Sit to stand with TA activation: x10 Standing mini squats with TA activation: x10, 2 sets Front step up: RLLR (6 inch) x10 Ascend/descend stairs 3x in clinic   PATIENT EDUCATION:  Education details: PT POC/goals, HEP Person educated: Patient Education method: Explanation Education comprehension: verbalized understanding  HOME EXERCISE PROGRAM: Access Code: [email or web address] URL: [email or web address] Date: 11/06/2023 Prepared by: Vernell Reges  Exercises - Hooklying Single Knee to Chest Stretch  - 1-2 x daily - 7 x weekly - 3 sets - 10 reps - Supine Piriformis Stretch with Foot on Ground  - 1-2 x daily - 7 x weekly - 5 reps - 20 hold - Supine Lower Trunk Rotation  - 1-2 x daily - 7 x weekly - 1 sets - 10 reps - Supine 90/90 Sciatic Nerve Glide with Knee Flexion/Extension  - 1-2 x daily - 7 x weekly - 1 sets - 10 reps - Hooklying Clamshell with Resistance  - 1-2 x daily - 7 x weekly - 2 sets - 10 reps - 5 hold - Sit to Stand Without Arm Support  - 2 x daily - 7 x weekly - 3 sets - 5 reps  ASSESSMENT:  CLINICAL IMPRESSION:  Continued with TA mm retraining today in various supine, sitting, standing functional positions including mini squat functional motion.  Would benefit from continuing to incorporate core mm retraining functionally into her course of PT to promote improved ability to stand/walk/perform her housework at home.  Able to perform 15 minutes of consecutive standing exercises today without needing a rest break in sitting.  No onset of R LE sx during session today.  Bridges  remain very difficult for her today.  Patient will continue to benefit from skilled therapy to address remaining deficits in order to improve quality of life and return to PLOF.     OBJECTIVE IMPAIRMENTS: decreased activity tolerance, decreased balance, decreased mobility,  difficulty walking, decreased ROM, and decreased strength.   ACTIVITY LIMITATIONS: lifting, standing, squatting, transfers, and locomotion level  PARTICIPATION LIMITATIONS: meal prep, cleaning, laundry, shopping, and community activity  PERSONAL FACTORS: Age and Time since onset of injury/illness/exacerbation are also affecting patient's functional outcome.   REHAB POTENTIAL: Good  CLINICAL DECISION MAKING: Stable/uncomplicated  EVALUATION COMPLEXITY: Low   GOALS: Goals reviewed with patient? Yes  SHORT TERM GOALS: Target date: 11/17/23  Pt will be able to tolerate standing x 20 min for meal prep in her kitchen without rest break Baseline: 3 min Goal status: INITIAL    LONG TERM GOALS: Target date: 12/15/23  Improve MODI score >10% indicating pt able to perform her daily activities without functionally being a significantly limited by her lower back sx Baseline: to be administered at visit 2; 6/30: 23/50 Goal status: INITIAL  2.  Improve 5x STS to <15 seconds indicating pt's LE strength and risk for falls have improved significantly Baseline: 22 sec Goal status: INITIAL  3.  Improve R SLR 10 deg without onset of R LE sx to facilitate pt being able to ambulate and perform her daily activities at home without being limited by R LE sx Baseline: 50 deg, lateral LE sx Goal status: INITIAL   PLAN:  PT FREQUENCY: 2x/week  PT DURATION: 6 weeks  PLANNED INTERVENTIONS: 97110-Therapeutic exercises, 97530- Therapeutic activity, [id number]- Neuromuscular re-education, 97535- Self Care, and [id number]- Manual therapy.  PLAN FOR NEXT SESSION: LE mobility/strength, core mm retraining, HEP initiation  Vernell Reges, PT,  DPT, OCS   Vernell FORBES Reges, PT 11/27/2023, 2:13 PM

## 2023-12-01 ENCOUNTER — Ambulatory Visit: Payer: Medicare (Managed Care)

## 2023-12-01 ENCOUNTER — Other Ambulatory Visit: Payer: Self-pay | Admitting: Internal Medicine

## 2023-12-01 DIAGNOSIS — I10 Essential (primary) hypertension: Secondary | ICD-10-CM

## 2023-12-04 ENCOUNTER — Ambulatory Visit: Payer: Medicare (Managed Care)

## 2023-12-04 DIAGNOSIS — M5416 Radiculopathy, lumbar region: Secondary | ICD-10-CM | POA: Diagnosis not present

## 2023-12-04 NOTE — Therapy (Signed)
 OUTPATIENT PHYSICAL THERAPY THORACOLUMBAR TREATMENT   Patient Name: Molly Ruiz MRN: [id number] DOB:Feb 22, 1941, 83 y.o., female Today's Date: 12/04/2023  END OF SESSION:  PT End of Session - 12/04/23 1247     Visit Number 6    Number of Visits 13    Date for PT Re-Evaluation 12/15/23    Authorization Type 2x/week x 6 weeks    PT Start Time 1248    PT Stop Time 1330    PT Time Calculation (min) 42 min    Activity Tolerance Patient tolerated treatment well    Behavior During Therapy WFL for tasks assessed/performed           Past Medical History:  Diagnosis Date   Complication of anesthesia    Hair falls out.   Depression    Diabetes mellitus without complication (HCC)    Hyperlipidemia    Hypertension    Sleep apnea    CPAP   Wears dentures    partial upper   Past Surgical History:  Procedure Laterality Date   ABDOMINAL HYSTERECTOMY     BILATERAL CARPAL TUNNEL RELEASE     CATARACT EXTRACTION W/PHACO Right 11/12/2018   Procedure: CATARACT EXTRACTION PHACO AND INTRAOCULAR LENS PLACEMENT (IOC)  RIGHT DIABETIC;  Surgeon: Myrna Adine Anes, MD;  Location: [facility];  Service: Ophthalmology;  Laterality: Right;  Diabetic - diet controlled sleep apnea   CERVICAL FUSION  2004   COLONOSCOPY WITH PROPOFOL  N/A 07/22/2019   Procedure: COLONOSCOPY WITH BIOPSIES;  Surgeon: Jinny Carmine, MD;  Location: [facility];  Service: Endoscopy;  Laterality: N/A;  Diabetic - diet controlled priority 4   FOOT SURGERY     HAND SURGERY Left 06/2022   palmar cyst removed   LUMBAR DISC SURGERY  1998   POLYPECTOMY N/A 07/22/2019   Procedure: POLYPECTOMY;  Surgeon: Jinny Carmine, MD;  Location: [facility];  Service: Endoscopy;  Laterality: N/A;   Patient Active Problem List   Diagnosis Date Noted   Lumbar radiculopathy 09/26/2023   Foraminal stenosis of lumbar region 09/26/2023   Spinal stenosis, lumbar region, with neurogenic claudication 09/26/2023   Acute  right-sided low back pain with right-sided sciatica 08/25/2023   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 03/09/2022   Stage 3a chronic kidney disease (HCC) 11/29/2021   Acute pain of right shoulder 09/24/2020   Statin myopathy 03/31/2020   Gastroesophageal reflux disease 09/19/2019   Special screening for malignant neoplasms, colon    Tubular adenoma of colon    Anxiety disorder 06/13/2019   Osteopenia determined by x-ray 06/06/2018   Essential hypertension 04/26/2018   Eczema 10/25/2017   Slow transit constipation 02/06/2017   Primary osteoarthritis of left hip 01/12/2017   OSA on CPAP 01/02/2017   Type II diabetes mellitus with complication (HCC) 06/24/2016   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   Hyperlipidemia associated with type 2 diabetes mellitus (HCC) 06/24/2016   Obesity (BMI 30.0-34.9) 06/24/2016   Vitamin D  deficiency 06/24/2016   History of shingles 06/24/2016   Allergic rhinitis 06/24/2016   PCP: Leita Adie, MD  REFERRING PROVIDER: Lyle Decamp, [facility]  REFERRING DIAG: [id number] (ICD-10-CM) - Acute lumbar radiculopathy   Rationale for Evaluation and Treatment: Rehabilitation  THERAPY DIAG:  Lumbar radiculopathy  ONSET DATE: 1 month ago  SUBJECTIVE:  SUBJECTIVE STATEMENT: Pt reports tightness/soreness in R lateral/posterior leg.  Was having low back pain and had an epidural steroid injection (R L4 nerve root) a few weeks ago (10/11/23), this has helped significantly with sx.  Has noticed her standing/walking tolerance is limited due to her sx.  Prior to injection her standing tolerance was 3 min a time, had to shower sitting on her shower chair and had to take breaks often while cooking/meal prep.  These are improving.  PERTINENT HISTORY:   Spinal stenosis-  difficulty walking, has been using a cane during recent flare up until just a few days ago; prior to injection was having R low back and leg pain/parasthesias to foot  She reports having cervical spine fusion surgery >20 years ago    PAIN:  Are you having pain? Currently 0/10   PRECAUTIONS: None  RED FLAGS: None   WEIGHT BEARING RESTRICTIONS: No  FALLS:  Has patient fallen in last 6 months? Yes. 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   NEXT MD VISIT: 11/07/23  OBJECTIVE:  Note: Objective measures were completed at Evaluation unless otherwise noted.  DIAGNOSTIC FINDINGS:  Per chart review, April 2025 MRI lumbar spine IMPRESSION: 1. 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 Bulging of the disc with biforaminal protrusions. Severe multifactorial stenosis at the disc level. Bilateral foraminal stenosis could compress either or both L4 nerves. The facet arthritis would likely be painful. 2. L2-3: Disc bulge. Facet and ligamentous hypertrophy. Mild multifactorial stenosis but without evidence of focal neural compression. 3. L3-4: Disc bulge. Facet and ligamentous hypertrophy. Mild stenosis of both lateral recesses but no definite neural compression. 4. L5-S1: Disc bulge. Mild facet degeneration. Mild proximal foraminal stenosis but without definite compression of the exiting L5 nerves.      PATIENT SURVEYS:  MODI: at visit #2  COGNITION: Overall cognitive status: Within functional limits for tasks assessed     SENSATION: WFL  MUSCLE LENGTH: (+) R SLR for limited mobility at 50  deg compared to L at 60 deg, no parasthesias reported  POSTURE: rounded shoulders  PALPATION: TTP R gluteal mm  LUMBAR ROM:   AROM eval  Flexion Mod limited  Extension Mod limited  Right lateral flexion Mod limited and painful  Left lateral flexion Mod limited  Right rotation   Left rotation    (Blank rows = not tested)  LOWER EXTREMITY ROM:     Active  Right eval Left eval  Hip flexion 110 110  Hip extension    Hip abduction    Hip adduction    Hip internal rotation    Hip external rotation 40 40  Knee flexion 130 130  Knee extension 0 0  Ankle dorsiflexion    Ankle plantarflexion    Ankle inversion    Ankle eversion     (Blank rows = not tested)  LOWER EXTREMITY MMT:    MMT Right eval Left eval  Hip flexion 4+ 4+  Hip extension    Hip abduction 4 4  Hip adduction    Hip internal rotation    Hip external rotation    Knee flexion  5 5  Knee extension 4 4+  Ankle dorsiflexion 4 5  Ankle plantarflexion 4 5  Ankle inversion    Ankle eversion     (Blank rows = not tested)  LUMBAR SPECIAL TESTS:  (+) SLR R for limited mobility and R lateral LE sx (thigh)  FUNCTIONAL TESTS:  5 times sit to stand: 22 seconds  GAIT: Amb without any AD into clinic today  TREATMENT DATE: 12/04/23  Subjective: Patient reports She did not have to wear her back brace this weekend.  Has not been using her cane in her house.  She is feeling a little sore behind her L knee today.      Pain: 0/10 R hip and knee   Objective:  Therapeutic Exercises: Nustep x level 4 x 10 minutes- achieved 3/10th mile today Supine lower trunk rotation x 10 each side   SKTC x 4 ea, 20 second hold, with PT Hip ER figure 4 in hooklying x 1 min ea with PT Piriformis stretch x 2 min with PT Hooklying bridge with adductor squeeze: 4x5- not today Hooklying abdominal brace + marches x20 Bridge + Blue TB at distal thighs for hip abd: 2x8 Seated marching 2 x 10 with cues for core activation Sit to  stand 2 x 10 with no UE support- not today Standing hip abd 3# 2x10 ea side, PT cues for upright trunk posture and not laterally flexing to substitute Standing marches: 3# 2x10 ea LE Standing hip extension: 3# 2x10 ea LE (difficult today, tactile cues needed)  Therapeutic Activtities: Pt ed for purpose of TA muscle activation, functionally the role of this postural mm as part of the core mm system during her daily activities Supine hooklying TA activation: x20 Sitting TA activation: x20 Sit to stand with TA activation: x10 Standing mini squats with TA activation: x10, 2 sets Front step up: RLLR (6 inch) x10 Ascend/descend stairs 3x in clinic  Pt education for how to use abdominal brace during functional sit to stand or lifting at home   PATIENT EDUCATION:  Education details: PT POC/goals, HEP Person educated: Patient Education method: Explanation Education comprehension: verbalized understanding  HOME EXERCISE PROGRAM: Access Code: [email or web address] URL: [email or web address] Date: 11/06/2023 Prepared by: Vernell Reges  Exercises - Hooklying Single Knee to Chest Stretch  - 1-2 x daily - 7 x weekly - 3 sets - 10 reps - Supine Piriformis Stretch with Foot on Ground  - 1-2 x daily - 7 x weekly - 5 reps - 20 hold - Supine Lower Trunk Rotation  - 1-2 x daily - 7 x weekly - 1 sets - 10 reps - Supine 90/90 Sciatic Nerve Glide with Knee Flexion/Extension  - 1-2 x daily - 7 x weekly - 1 sets - 10 reps - Hooklying Clamshell with Resistance  - 1-2 x daily - 7 x weekly - 2 sets - 10 reps - 5 hold - Sit to Stand Without Arm Support  - 2 x daily - 7 x weekly - 3 sets - 5 reps  ASSESSMENT:  CLINICAL IMPRESSION:  Continued with TA mm retraining today in various supine, sitting, standing functional positions including mini squat functional motion.  Coordination for abdominal bracing technique is improving.  Would benefit from continuing to incorporate core mm retraining functionally  into her course of PT to promote improved ability to stand/walk/perform her housework at home.  Progressed LE/lumbopelvic mm strengthening today with increased resistance.  No onset of R LE sx during session today.  Kallie remain very  difficult for her today.  Patient will continue to benefit from skilled therapy to address remaining deficits in order to improve quality of life and return to PLOF.     OBJECTIVE IMPAIRMENTS: decreased activity tolerance, decreased balance, decreased mobility, difficulty walking, decreased ROM, and decreased strength.   ACTIVITY LIMITATIONS: lifting, standing, squatting, transfers, and locomotion level  PARTICIPATION LIMITATIONS: meal prep, cleaning, laundry, shopping, and community activity  PERSONAL FACTORS: Age and Time since onset of injury/illness/exacerbation are also affecting patient's functional outcome.   REHAB POTENTIAL: Good  CLINICAL DECISION MAKING: Stable/uncomplicated  EVALUATION COMPLEXITY: Low   GOALS: Goals reviewed with patient? Yes  SHORT TERM GOALS: Target date: 11/17/23  Pt will be able to tolerate standing x 20 min for meal prep in her kitchen without rest break Baseline: 3 min Goal status: INITIAL    LONG TERM GOALS: Target date: 12/15/23  Improve MODI score >10% indicating pt able to perform her daily activities without functionally being a significantly limited by her lower back sx Baseline: to be administered at visit 2; 6/30: 23/50 Goal status: INITIAL  2.  Improve 5x STS to <15 seconds indicating pt's LE strength and risk for falls have improved significantly Baseline: 22 sec Goal status: INITIAL  3.  Improve R SLR 10 deg without onset of R LE sx to facilitate pt being able to ambulate and perform her daily activities at home without being limited by R LE sx Baseline: 50 deg, lateral LE sx Goal status: INITIAL   PLAN:  PT FREQUENCY: 2x/week  PT DURATION: 6 weeks  PLANNED INTERVENTIONS: 97110-Therapeutic  exercises, 97530- Therapeutic activity, [id number]- Neuromuscular re-education, 97535- Self Care, and [id number]- Manual therapy.  PLAN FOR NEXT SESSION: LE mobility/strength, core mm retraining, HEP progression  Vernell Reges, PT, DPT, OCS   Xylan Sheils E Taji Sather, PT 12/04/2023, 12:47 PM

## 2023-12-04 NOTE — Telephone Encounter (Signed)
 Requested Prescriptions  Pending Prescriptions Disp Refills   amLODipine  (NORVASC ) 5 MG tablet [Pharmacy Med Name: amLODIPine  Besylate 5 MG Oral Tablet] 90 tablet 0    Sig: Take 1 tablet by mouth once daily     Cardiovascular: Calcium  Channel Blockers 2 Failed - 12/04/2023  7:56 AM      Failed - Last BP in normal range    BP Readings from Last 1 Encounters:  11/07/23 (!) 144/65         Passed - Last Heart Rate in normal range    Pulse Readings from Last 1 Encounters:  11/07/23 87         Passed - Valid encounter within last 6 months    Recent Outpatient Visits           2 months ago Type II diabetes mellitus with complication [facility])   [place] [facility] at [facility], Leita DEL, MD   2 months ago Localized edema   [place] [facility] at [facility], Leita DEL, MD   3 months ago Acute right-sided low back pain with right-sided sciatica    Primary Care & Sports Medicine at [facility], Leita DEL, MD   3 months ago Acute right-sided low back pain with right-sided sciatica   [facility] at [facility], Leita DEL, MD   3 months ago Candida infection   [facility] at [facility], Leita DEL, MD

## 2023-12-08 ENCOUNTER — Ambulatory Visit: Payer: Medicare (Managed Care) | Attending: Physician Assistant

## 2023-12-08 DIAGNOSIS — M5416 Radiculopathy, lumbar region: Secondary | ICD-10-CM | POA: Diagnosis not present

## 2023-12-08 NOTE — Therapy (Signed)
 OUTPATIENT PHYSICAL THERAPY THORACOLUMBAR TREATMENT   Patient Name: Molly Ruiz MRN: [id number] DOB:Nov 26, 1940, 83 y.o., female Today's Date: 12/08/2023  END OF SESSION:  PT End of Session - 12/08/23 0812     Visit Number 7    Number of Visits 13    Date for PT Re-Evaluation 12/15/23    Authorization Type 2x/week x 6 weeks    PT Start Time 0800    PT Stop Time 0845    PT Time Calculation (min) 45 min    Activity Tolerance Patient tolerated treatment well    Behavior During Therapy [facility] for tasks assessed/performed            Past Medical History:  Diagnosis Date   Complication of anesthesia    Hair falls out.   Depression    Diabetes mellitus without complication (HCC)    Hyperlipidemia    Hypertension    Sleep apnea    CPAP   Wears dentures    partial upper   Past Surgical History:  Procedure Laterality Date   ABDOMINAL HYSTERECTOMY     BILATERAL CARPAL TUNNEL RELEASE     CATARACT EXTRACTION W/PHACO Right 11/12/2018   Procedure: CATARACT EXTRACTION PHACO AND INTRAOCULAR LENS PLACEMENT (IOC)  RIGHT DIABETIC;  Surgeon: Myrna Adine Anes, MD;  Location: [facility];  Service: Ophthalmology;  Laterality: Right;  Diabetic - diet controlled sleep apnea   CERVICAL FUSION  2004   COLONOSCOPY WITH PROPOFOL  N/A 07/22/2019   Procedure: COLONOSCOPY WITH BIOPSIES;  Surgeon: Jinny Carmine, MD;  Location: [facility];  Service: Endoscopy;  Laterality: N/A;  Diabetic - diet controlled priority 4   FOOT SURGERY     HAND SURGERY Left 06/2022   palmar cyst removed   LUMBAR DISC SURGERY  1998   POLYPECTOMY N/A 07/22/2019   Procedure: POLYPECTOMY;  Surgeon: Jinny Carmine, MD;  Location: [facility];  Service: Endoscopy;  Laterality: N/A;   Patient Active Problem List   Diagnosis Date Noted   Lumbar radiculopathy 09/26/2023   Foraminal stenosis of lumbar region 09/26/2023   Spinal stenosis, lumbar region, with neurogenic claudication 09/26/2023    Acute right-sided low back pain with right-sided sciatica 08/25/2023   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 03/09/2022   Stage 3a chronic kidney disease (HCC) 11/29/2021   Acute pain of right shoulder 09/24/2020   Statin myopathy 03/31/2020   Gastroesophageal reflux disease 09/19/2019   Special screening for malignant neoplasms, colon    Tubular adenoma of colon    Anxiety disorder 06/13/2019   Osteopenia determined by x-ray 06/06/2018   Essential hypertension 04/26/2018   Eczema 10/25/2017   Slow transit constipation 02/06/2017   Primary osteoarthritis of left hip 01/12/2017   OSA on CPAP 01/02/2017   Type II diabetes mellitus with complication (HCC) 06/24/2016   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   Hyperlipidemia associated with type 2 diabetes mellitus (HCC) 06/24/2016   Obesity (BMI 30.0-34.9) 06/24/2016   Vitamin D  deficiency 06/24/2016   History of shingles 06/24/2016   Allergic rhinitis 06/24/2016   PCP: Leita Adie, MD  REFERRING PROVIDER: Lyle Decamp, [facility]  REFERRING DIAG: [id number] (ICD-10-CM) - Acute lumbar radiculopathy   Rationale for Evaluation and Treatment: Rehabilitation  THERAPY DIAG:  Lumbar radiculopathy  ONSET DATE: 1 month ago  SUBJECTIVE:  SUBJECTIVE STATEMENT: Pt reports tightness/soreness in R lateral/posterior leg.  Was having low back pain and had an epidural steroid injection (R L4 nerve root) a few weeks ago (10/11/23), this has helped significantly with sx.  Has noticed her standing/walking tolerance is limited due to her sx.  Prior to injection her standing tolerance was 3 min a time, had to shower sitting on her shower chair and had to take breaks often while cooking/meal prep.  These are improving.  PERTINENT HISTORY:   Spinal  stenosis- difficulty walking, has been using a cane during recent flare up until just a few days ago; prior to injection was having R low back and leg pain/parasthesias to foot  She reports having cervical spine fusion surgery >20 years ago    PAIN:  Are you having pain? Currently 0/10   PRECAUTIONS: None  RED FLAGS: None   WEIGHT BEARING RESTRICTIONS: No  FALLS:  Has patient fallen in last 6 months? Yes. 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   NEXT MD VISIT: 11/07/23  OBJECTIVE:  Note: Objective measures were completed at Evaluation unless otherwise noted.  DIAGNOSTIC FINDINGS:  Per chart review, April 2025 MRI lumbar spine IMPRESSION: 1. 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 Bulging of the disc with biforaminal protrusions. Severe multifactorial stenosis at the disc level. Bilateral foraminal stenosis could compress either or both L4 nerves. The facet arthritis would likely be painful. 2. L2-3: Disc bulge. Facet and ligamentous hypertrophy. Mild multifactorial stenosis but without evidence of focal neural compression. 3. L3-4: Disc bulge. Facet and ligamentous hypertrophy. Mild stenosis of both lateral recesses but no definite neural compression. 4. L5-S1: Disc bulge. Mild facet degeneration. Mild proximal foraminal stenosis but without definite compression of the exiting L5 nerves.      PATIENT SURVEYS:  MODI: at visit #2  COGNITION: Overall cognitive status: Within functional limits for tasks assessed     SENSATION: WFL  MUSCLE LENGTH: (+) R SLR for limited  mobility at 50 deg compared to L at 60 deg, no parasthesias reported  POSTURE: rounded shoulders  PALPATION: TTP R gluteal mm  LUMBAR ROM:   AROM eval  Flexion Mod limited  Extension Mod limited  Right lateral flexion Mod limited and painful  Left lateral flexion Mod limited  Right rotation   Left rotation    (Blank rows = not tested)  LOWER EXTREMITY ROM:     Active  Right eval Left eval  Hip flexion 110 110  Hip extension    Hip abduction    Hip adduction    Hip internal rotation    Hip external rotation 40 40  Knee flexion 130 130  Knee extension 0 0  Ankle dorsiflexion    Ankle plantarflexion    Ankle inversion    Ankle eversion     (Blank rows = not tested)  LOWER EXTREMITY MMT:    MMT Right eval Left eval  Hip flexion 4+ 4+  Hip extension    Hip abduction 4 4  Hip adduction    Hip internal rotation    Hip external rotation    Knee flexion  5 5  Knee extension 4 4+  Ankle dorsiflexion 4 5  Ankle plantarflexion 4 5  Ankle inversion    Ankle eversion     (Blank rows = not tested)  LUMBAR SPECIAL TESTS:  (+) SLR R for limited mobility and R lateral LE sx (thigh)  FUNCTIONAL TESTS:  5 times sit to stand: 22 seconds  GAIT: Amb without any AD into clinic today  TREATMENT DATE: 12/08/23  Subjective: Pt states she had a little tightness/soreness in her R calf last night, she did some of the stretches from her HEP and it felt better and she was able to sleep.  Overall, she is able to stand and do a little more on her feet now.  Has not had to wear her back brace at all this week.  Would like to continue with her HEP independently at this time.  Feeling much better since starting PT.    Pain: 0/10 R hip and knee   Objective: 5x STS: 14 seconds (was 22 seconds at initial eval)   Therapeutic Exercises: Nustep x level 4 x 10 minutes- achieved 3/10th mile today Supine lower trunk rotation x 10 each side   SKTC x 4 ea, 20 second hold, with PT Hip ER  figure 4 in hooklying x 1 min ea with PT Piriformis stretch x 2 min with PT Hooklying bridge with adductor squeeze: 4x5- not today Hooklying abdominal brace + marches x20 Bridge + Blue TB at distal thighs for hip abd: 2x8 Seated marching 2 x 10 with cues for core activation Sit to stand 2 x 10 with no UE support Standing hip abd 3# 2x10 ea side, PT cues for upright trunk posture and not laterally flexing to substitute Standing marches: 3# 2x10 ea LE Standing hip extension: 3# 2x10 ea LE (difficult today, tactile cues needed) Heel raises: x15 b/l  Therapeutic Activtities: Pt ed for purpose of TA muscle activation, functionally the role of this postural mm as part of the core mm system during her daily activities Supine hooklying TA activation: x20 Sitting TA activation: x20 Sit to stand with TA activation: x10 Standing mini squats with TA activation: x10, 2 sets Front step up: RLLR (6 inch) x10- not today Ascend/descend stairs 3x in clinic- not today Seated hip hinge forward x 10 with hands on knees   Pt education for how to use abdominal brace during functional sit to stand or lifting at home, reviewed purpose of HEP   PATIENT EDUCATION:  Education details: PT POC/goals, HEP Person educated: Patient Education method: Explanation Education comprehension: verbalized understanding  HOME EXERCISE PROGRAM: Access Code: [email or web address] URL: [email or web address] Date: 11/06/2023 Prepared by: Vernell Reges  Exercises - Hooklying Single Knee to Chest Stretch  - 1-2 x daily - 7 x weekly - 3 sets - 10 reps - Supine Piriformis Stretch with Foot on Ground  - 1-2 x daily - 7 x weekly - 5 reps - 20 hold - Supine Lower Trunk Rotation  - 1-2 x daily - 7 x weekly - 1 sets - 10 reps - Supine 90/90 Sciatic Nerve Glide with Knee Flexion/Extension  - 1-2 x daily - 7 x weekly - 1 sets - 10 reps - Hooklying Clamshell with Resistance  - 1-2 x daily - 7 x weekly - 2 sets - 10 reps - 5  hold - Sit to Stand Without Arm Support  - 2 x daily - 7 x weekly - 3 sets - 5 reps  ASSESSMENT:  CLINICAL IMPRESSION:  Continued with lumbopelvic mm strengthening today.  Pt's 5x STS time has improved from 22 seconds to 14 seconds.  No onset of R LE sx throughout session today. Pt demonstrates ability to perform HEP with good technique.  She would like to continue with HEP independently today.  Her standing endurance has improved as she can perform 20 min of exercise in clinic without a sitting break needed; and at home she is performing 20-30 min housework/cooking/meal prep without wearing a back brace and without being limited by LBP or R LE sx.    OBJECTIVE IMPAIRMENTS: decreased activity tolerance, decreased balance, decreased mobility, difficulty walking, decreased ROM, and decreased strength.   ACTIVITY LIMITATIONS: lifting, standing, squatting, transfers, and locomotion level  PARTICIPATION LIMITATIONS: meal prep, cleaning, laundry, shopping, and community activity  PERSONAL FACTORS: Age and Time since onset of injury/illness/exacerbation are also affecting patient's functional outcome.   REHAB POTENTIAL: Good  CLINICAL DECISION MAKING: Stable/uncomplicated  EVALUATION COMPLEXITY: Low   GOALS: Goals reviewed with patient? Yes  SHORT TERM GOALS: Target date: 11/17/23  Pt will be able to tolerate standing x 20 min for meal prep in her kitchen without rest break Baseline: 3 min; 8/1: 20-30 min at a time Goal status: MET    LONG TERM GOALS: Target date: 12/15/23  Improve MODI score >10% indicating pt able to perform her daily activities without functionally being a significantly limited by her lower back sx Baseline: to be administered at visit 2; 6/30: 23/50 Goal status: INITIAL  2.  Improve 5x STS to <15 seconds indicating pt's LE strength and risk for falls have improved significantly Baseline: 22 sec; 8/1: 14 sec Goal status: MET  3.  Improve R SLR 10 deg without  onset of R LE sx to facilitate pt being able to ambulate and perform her daily activities at home without being limited by R LE sx Baseline: 50 deg, lateral LE sx; 8/1: 70 deg Goal status: MET   PLAN:  PT FREQUENCY: 2x/week  PT DURATION: 6 weeks  PLANNED INTERVENTIONS: 97110-Therapeutic exercises, 97530- Therapeutic activity, 97112- Neuromuscular re-education, 97535- Self Care, and 02859- Manual therapy.  PLAN FOR NEXT SESSION: plan to transition to pt working on HEP independently.  Instructed to call if she has questions/concerns with HEP.  If no return then plan to DC in 2 weeks.  Vernell Reges, PT, DPT, OCS   Vernell FORBES Reges, PT 12/08/2023, 8:12 AM

## 2023-12-17 DIAGNOSIS — G4733 Obstructive sleep apnea (adult) (pediatric): Secondary | ICD-10-CM | POA: Diagnosis not present

## 2024-01-03 ENCOUNTER — Other Ambulatory Visit: Payer: Self-pay | Admitting: Internal Medicine

## 2024-01-03 DIAGNOSIS — F419 Anxiety disorder, unspecified: Secondary | ICD-10-CM

## 2024-01-04 NOTE — Telephone Encounter (Signed)
 Requested medication (s) are due for refill today: yes  Requested medication (s) are on the active medication list: yes  Last refill:  09/29/23 #90  Future visit scheduled: yes  Notes to clinic:  overdue lab work   Requested Prescriptions  Pending Prescriptions Disp Refills   buPROPion  (WELLBUTRIN  XL) 300 MG 24 hr tablet [Pharmacy Med Name: buPROPion  HCl ER (XL) 300 MG Oral Tablet Extended Release 24 Hour] 90 tablet 0    Sig: Take 1 tablet by mouth once daily     Psychiatry: Antidepressants - bupropion  Failed - 01/04/2024  2:59 PM      Failed - AST in normal range and within 360 days    AST  Date Value Ref Range Status  11/08/2022 21 0 - 40 IU/L Final         Failed - ALT in normal range and within 360 days    ALT  Date Value Ref Range Status  11/08/2022 13 0 - 32 IU/L Final         Failed - Last BP in normal range    BP Readings from Last 1 Encounters:  11/07/23 (!) 144/65         Passed - Cr in normal range and within 360 days    Creatinine, Ser  Date Value Ref Range Status  09/28/2023 0.83 0.44 - 1.00 mg/dL Final         Passed - Valid encounter within last 6 months    Recent Outpatient Visits           3 months ago Type II diabetes mellitus with complication [facility])   [place] [facility] at [facility], Leita DEL, MD   3 months ago Localized edema   [place] [facility] at [facility], Leita DEL, MD   4 months ago Acute right-sided low back pain with right-sided sciatica    Primary Care & Sports Medicine at [facility], Leita DEL, MD   4 months ago Acute right-sided low back pain with right-sided sciatica   [facility] Medicine at [facility], Leita DEL, MD   4 months ago Candida infection   [facility] Care [facility] Medicine at [facility], Leita DEL, MD

## 2024-01-10 ENCOUNTER — Other Ambulatory Visit: Payer: Self-pay | Admitting: Internal Medicine

## 2024-01-10 ENCOUNTER — Ambulatory Visit: Payer: Medicare (Managed Care) | Admitting: Internal Medicine

## 2024-01-10 ENCOUNTER — Encounter: Payer: Self-pay | Admitting: Internal Medicine

## 2024-01-10 VITALS — BP 146/80 | HR 82 | Ht 66.5 in | Wt 187.0 lb

## 2024-01-10 DIAGNOSIS — M48061 Spinal stenosis, lumbar region without neurogenic claudication: Secondary | ICD-10-CM

## 2024-01-10 DIAGNOSIS — Z7984 Long term (current) use of oral hypoglycemic drugs: Secondary | ICD-10-CM

## 2024-01-10 DIAGNOSIS — G720 Drug-induced myopathy: Secondary | ICD-10-CM | POA: Diagnosis not present

## 2024-01-10 DIAGNOSIS — E785 Hyperlipidemia, unspecified: Secondary | ICD-10-CM

## 2024-01-10 DIAGNOSIS — K219 Gastro-esophageal reflux disease without esophagitis: Secondary | ICD-10-CM

## 2024-01-10 DIAGNOSIS — E1169 Type 2 diabetes mellitus with other specified complication: Secondary | ICD-10-CM | POA: Diagnosis not present

## 2024-01-10 DIAGNOSIS — Z1231 Encounter for screening mammogram for malignant neoplasm of breast: Secondary | ICD-10-CM | POA: Diagnosis not present

## 2024-01-10 DIAGNOSIS — Z0000 Encounter for general adult medical examination without abnormal findings: Secondary | ICD-10-CM

## 2024-01-10 DIAGNOSIS — I10 Essential (primary) hypertension: Secondary | ICD-10-CM

## 2024-01-10 DIAGNOSIS — E118 Type 2 diabetes mellitus with unspecified complications: Secondary | ICD-10-CM

## 2024-01-10 MED ORDER — HYDROCHLOROTHIAZIDE 25 MG PO TABS: 25.0000 mg | ORAL_TABLET | Freq: Every day | ORAL | 1 refills | Status: AC

## 2024-01-10 NOTE — Telephone Encounter (Signed)
 Requested Prescriptions  Pending Prescriptions Disp Refills   ezetimibe  (ZETIA ) 10 MG tablet [Pharmacy Med Name: Ezetimibe  10 MG Oral Tablet] 90 tablet 0    Sig: Take 1 tablet by mouth once daily     Cardiovascular:  Antilipid - Sterol Transport Inhibitors Failed - 01/10/2024  3:29 PM      Failed - AST in normal range and within 360 days    AST  Date Value Ref Range Status  11/08/2022 21 0 - 40 IU/L Final         Failed - ALT in normal range and within 360 days    ALT  Date Value Ref Range Status  11/08/2022 13 0 - 32 IU/L Final         Failed - Lipid Panel in normal range within the last 12 months    Cholesterol, Total  Date Value Ref Range Status  11/08/2022 189 100 - 199 mg/dL Final   LDL Chol Calc (NIH)  Date Value Ref Range Status  11/08/2022 95 0 - 99 mg/dL Final   HDL  Date Value Ref Range Status  11/08/2022 68 >39 mg/dL Final   Triglycerides  Date Value Ref Range Status  11/08/2022 155 (H) 0 - 149 mg/dL Final         Passed - Patient is not pregnant      Passed - Valid encounter within last 12 months    Recent Outpatient Visits           Today Annual physical exam   [place] [facility] at [facility], Leita DEL, MD   3 months ago Type II diabetes mellitus with complication [facility])   [place] [facility] Medicine at [facility], Leita DEL, MD   4 months ago Localized edema   [place] [facility] Medicine at [facility], Leita DEL, MD   4 months ago Acute right-sided low back pain with right-sided sciatica   [facility] Medicine at [facility], Leita DEL, MD   4 months ago Acute right-sided low back pain with right-sided sciatica   [facility] Primary Care & Sports Medicine at [facility], Leita DEL, MD

## 2024-01-10 NOTE — Patient Instructions (Signed)
Call [facility] to schedule your mammogram at [phone].  Due in February.

## 2024-01-10 NOTE — Assessment & Plan Note (Signed)
 Blood sugars have been stable.  No hypoglycemic events since last visit. Currently medications are none. Last visit medical regimen changes were none. Lab Results  Component Value Date   HGBA1C 6.4 (A) 07/14/2023

## 2024-01-10 NOTE — Assessment & Plan Note (Signed)
 Continue zetia  - unable to take statins Will check labs.

## 2024-01-10 NOTE — Progress Notes (Signed)
 Date:  01/10/2024   Name:  Molly Ruiz   DOB:  January 13, 1941   MRN:  [id number]   Chief Complaint: Annual Exam Molly Ruiz is a 83 y.o. female who presents today for her Complete Annual Exam. She feels fairly well. She reports exercising walking some and therapy exercises. She reports she is sleeping fairly well. Breast complaints none.  Health Maintenance  Topic Date Due   Yearly kidney health urinalysis for diabetes  11/08/2023   COVID-19 Vaccine (7 - 2024-25 season) 01/08/2024   Flu Shot  08/06/2024*   Hemoglobin A1C  01/14/2024   Mammogram  06/25/2024   Medicare Annual Wellness Visit  07/04/2024   Colon Cancer Screening  07/21/2024   Yearly kidney function blood test for diabetes  09/27/2024   Eye exam for diabetics  11/01/2024   Complete foot exam   01/09/2025   DTaP/Tdap/Td vaccine (2 - Td or Tdap) 06/24/2026   Pneumococcal Vaccine for age over 70  Completed   DEXA scan (bone density measurement)  Completed   Zoster (Shingles) Vaccine  Completed   HPV Vaccine  Aged Out   Meningitis B Vaccine  Aged Out   Hepatitis C Screening  Discontinued  *Topic was postponed. The date shown is not the original due date.     Diabetes Pertinent negatives for hypoglycemia include no dizziness, headaches or nervousness/anxiousness. Pertinent negatives for diabetes include no chest pain, no fatigue and no weakness.  Hyperlipidemia Pertinent negatives include no chest pain, myalgias or shortness of breath.  Hypertension Pertinent negatives include no chest pain, headaches, palpitations or shortness of breath.  Gastroesophageal Reflux She reports no abdominal pain, no chest pain, no coughing or no wheezing. Pertinent negatives include no fatigue.    Review of Systems  Constitutional:  Negative for fatigue and unexpected weight change.  HENT:  Negative for trouble swallowing.   Eyes:  Negative for visual disturbance.  Respiratory:  Negative for cough, chest tightness, shortness of  breath and wheezing.   Cardiovascular:  Negative for chest pain, palpitations and leg swelling.  Gastrointestinal:  Negative for abdominal pain, constipation and diarrhea.  Genitourinary:  Negative for frequency and urgency.  Musculoskeletal:  Positive for back pain and gait problem (uses cane). Negative for arthralgias and myalgias.  Allergic/Immunologic: Negative for environmental allergies and food allergies.  Neurological:  Negative for dizziness, weakness, light-headedness and headaches.  Psychiatric/Behavioral:  Negative for dysphoric mood. The patient is not nervous/anxious.      Lab Results  Component Value Date   NA 138 09/28/2023   K 4.4 09/28/2023   CO2 26 09/28/2023   GLUCOSE 125 (H) 09/28/2023   BUN 11 09/28/2023   CREATININE 0.83 09/28/2023   CALCIUM  9.7 09/28/2023   EGFR 60 03/13/2023   GFRNONAA >60 09/28/2023   Lab Results  Component Value Date   CHOL 189 11/08/2022   HDL 68 11/08/2022   LDLCALC 95 11/08/2022   TRIG 155 (H) 11/08/2022   CHOLHDL 2.8 11/08/2022   Lab Results  Component Value Date   TSH 1.420 11/08/2022   Lab Results  Component Value Date   HGBA1C 6.4 (A) 07/14/2023   Lab Results  Component Value Date   WBC 4.8 09/28/2023   HGB 13.1 09/28/2023   HCT 38.8 09/28/2023   MCV 93.3 09/28/2023   PLT 244 09/28/2023   Lab Results  Component Value Date   ALT 13 11/08/2022   AST 21 11/08/2022   ALKPHOS 123 (H) 11/08/2022   BILITOT 0.4  11/08/2022   Lab Results  Component Value Date   VD25OH 37.3 11/02/2021     Patient Active Problem List   Diagnosis Date Noted   Lumbar radiculopathy 09/26/2023   Foraminal stenosis of lumbar region 09/26/2023   Spinal stenosis, lumbar region, with neurogenic claudication 09/26/2023   Acute right-sided low back pain with right-sided sciatica 08/25/2023   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 03/09/2022   Stage 3a chronic kidney disease (HCC) 11/29/2021   Acute pain of right shoulder 09/24/2020    Statin myopathy 03/31/2020   Gastroesophageal reflux disease 09/19/2019   Special screening for malignant neoplasms, colon    Tubular adenoma of colon    Anxiety disorder 06/13/2019   Osteopenia determined by x-ray 06/06/2018   Essential hypertension 04/26/2018   Eczema 10/25/2017   Slow transit constipation 02/06/2017   Primary osteoarthritis of left hip 01/12/2017   OSA on CPAP 01/02/2017   Type II diabetes mellitus with complication (HCC) 06/24/2016   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   Hyperlipidemia associated with type 2 diabetes mellitus (HCC) 06/24/2016   Obesity (BMI 30.0-34.9) 06/24/2016   Vitamin D  deficiency 06/24/2016   History of shingles 06/24/2016   Allergic rhinitis 06/24/2016    Allergies  Allergen Reactions   Atorvastatin  Other (See Comments)    Peripheral neuropathy and myalgia   Influenza Vaccines Rash    Past Surgical History:  Procedure Laterality Date   ABDOMINAL HYSTERECTOMY     BILATERAL CARPAL TUNNEL RELEASE     CATARACT EXTRACTION W/PHACO Right 11/12/2018   Procedure: CATARACT EXTRACTION PHACO AND INTRAOCULAR LENS PLACEMENT (IOC)  RIGHT DIABETIC;  Surgeon: Myrna Adine Anes, MD;  Location: [facility];  Service: Ophthalmology;  Laterality: Right;  Diabetic - diet controlled sleep apnea   CERVICAL FUSION  2004   COLONOSCOPY WITH PROPOFOL  N/A 07/22/2019   Procedure: COLONOSCOPY WITH BIOPSIES;  Surgeon: Jinny Carmine, MD;  Location: [facility];  Service: Endoscopy;  Laterality: N/A;  Diabetic - diet controlled priority 4   FOOT SURGERY     HAND SURGERY Left 06/2022   palmar cyst removed   LUMBAR DISC SURGERY  1998   POLYPECTOMY N/A 07/22/2019   Procedure: POLYPECTOMY;  Surgeon: Jinny Carmine, MD;  Location: [facility];  Service: Endoscopy;  Laterality: N/A;    Social History   Tobacco Use   Smoking status: Never   Smokeless tobacco: Never  Vaping Use   Vaping status: Never Used  Substance  Use Topics   Alcohol use: No   Drug use: No     Medication list has been reviewed and updated.  Current Meds  Medication Sig   acetaminophen  (TYLENOL ) 500 MG tablet Take 2 tablets (1,000 mg total) by mouth 2 (two) times daily.   amLODipine  (NORVASC ) 5 MG tablet Take 1 tablet by mouth once daily   Bisacodyl (LAXATIVE PO) Take by mouth. Generic OTC PRN   Blood Glucose Monitoring Suppl DEVI 1 each by Does not apply route in the morning, at noon, and at bedtime. May substitute to any manufacturer covered by patient's insurance.   buPROPion  (WELLBUTRIN  XL) 300 MG 24 hr tablet Take 1 tablet by mouth once daily   CALCIUM  CITRATE PO Take 1,200 mg by mouth daily.   cholecalciferol (VITAMIN D3) 25 MCG (1000 UNIT) tablet Take 1,000 Units by mouth daily.   ezetimibe  (ZETIA ) 10 MG tablet Take 1 tablet by mouth once daily   famotidine  (PEPCID ) 40 MG tablet Take 1 tablet (40  mg total) by mouth at bedtime.   glucose blood (ONETOUCH ULTRA) test strip Test Blood Sugar twice daily.   hydrochlorothiazide  (HYDRODIURIL ) 25 MG tablet Take 1 tablet (25 mg total) by mouth daily.   irbesartan  (AVAPRO ) 300 MG tablet Take 1 tablet (300 mg total) by mouth daily.   meloxicam  (MOBIC ) 15 MG tablet Take 1 tablet by mouth once daily   Multiple Vitamins-Minerals (MULTIVITAMIN ADULTS 50+ PO) Take 1 tablet by mouth daily.   mupirocin  ointment (BACTROBAN ) 2 % Apply 1 Application topically 2 (two) times daily.   NON FORMULARY CPAP @@ 12 cm H20   nystatin  cream (MYCOSTATIN ) Apply to affected area 2 times daily   nystatin -triamcinolone  ointment (MYCOLOG) Apply 1 Application topically 2 (two) times daily.   Omega-3 Fatty Acids (FISH OIL) 1000 MG CAPS Take 1 capsule by mouth daily.   OneTouch Delica Lancets 33G MISC Inject 1 each into the skin daily.   REFRESH 1.4-0.6 % SOLN Place 1 drop into both eyes as needed.       01/10/2024   10:40 AM 10/04/2023    2:06 PM 09/07/2023   11:01 AM 08/25/2023    1:47 PM  GAD 7 : Generalized  Anxiety Score  Nervous, Anxious, on Edge 0 0 1 0  Control/stop worrying 0 0 0 0  Worry too much - different things 0 0 0   Trouble relaxing 0 0 1   Restless 0 3 1   Easily annoyed or irritable 0 0 1   Afraid - awful might happen 0 0 0   Total GAD 7 Score 0 3 4   Anxiety Difficulty Not difficult at all Not difficult at all Not difficult at all        01/10/2024   10:40 AM 11/07/2023   10:28 AM 10/11/2023   10:46 AM  Depression screen PHQ 2/9  Decreased Interest 0 0 0  Down, Depressed, Hopeless 0 0 0  PHQ - 2 Score 0 0 0  Altered sleeping 0    Tired, decreased energy 0    Change in appetite 0    Feeling bad or failure about yourself  0    Trouble concentrating 0    Moving slowly or fidgety/restless 0    Suicidal thoughts 0    PHQ-9 Score 0    Difficult doing work/chores Not difficult at all      BP Readings from Last 3 Encounters:  01/10/24 (!) 146/80  11/07/23 (!) 144/65  10/11/23 (!) 159/81    Physical Exam Vitals and nursing note reviewed.  Constitutional:      General: She is not in acute distress.    Appearance: She is well-developed.  HENT:     Head: Normocephalic and atraumatic.     Right Ear: Tympanic membrane and ear canal normal.     Left Ear: Tympanic membrane and ear canal normal.     Nose:     Right Sinus: No maxillary sinus tenderness.     Left Sinus: No maxillary sinus tenderness.  Eyes:     General: No scleral icterus.       Right eye: No discharge.        Left eye: No discharge.     Conjunctiva/sclera: Conjunctivae normal.  Neck:     Thyroid : No thyromegaly.     Vascular: No carotid bruit.  Cardiovascular:     Rate and Rhythm: Normal rate and regular rhythm.     Pulses: Normal pulses.     Heart sounds: Normal  heart sounds.  Pulmonary:     Effort: Pulmonary effort is normal. No respiratory distress.     Breath sounds: No wheezing.  Abdominal:     General: Bowel sounds are normal.     Palpations: Abdomen is soft.     Tenderness: There is no  abdominal tenderness.  Musculoskeletal:     Cervical back: Normal range of motion. No erythema.     Right lower leg: No edema.     Left lower leg: No edema.  Lymphadenopathy:     Cervical: No cervical adenopathy.  Skin:    General: Skin is warm and dry.     Findings: No rash.  Neurological:     General: No focal deficit present.     Mental Status: She is alert and oriented to person, place, and time.     Cranial Nerves: No cranial nerve deficit.     Sensory: No sensory deficit.     Gait: Gait normal.     Deep Tendon Reflexes: Reflexes are normal and symmetric.  Psychiatric:        Attention and Perception: Attention normal.        Mood and Affect: Mood normal.        Behavior: Behavior normal.     Wt Readings from Last 3 Encounters:  01/10/24 187 lb (84.8 kg)  11/07/23 188 lb (85.3 kg)  10/11/23 188 lb (85.3 kg)    BP (!) 146/80   Pulse 82   Ht 5' 6.5 (1.689 m)   Wt 187 lb (84.8 kg)   SpO2 97%   BMI 29.73 kg/m   Assessment and Plan:  Problem List Items Addressed This Visit       Unprioritized   Essential hypertension (Chronic)   Blood pressure is fairly well controlled on irbesartan  and amlodipine . No medication side effects noted.  She has been getting >140 consistently at home.  No changes in diet or medications to attribute. Continue same medications but add hydrochlorothiazide  25 mg daily and recheck in 2 months.       Relevant Medications   hydrochlorothiazide  (HYDRODIURIL ) 25 MG tablet   Other Relevant Orders   CBC with Differential/Platelet   Comprehensive metabolic panel with GFR   TSH   Gastroesophageal reflux disease (Chronic)   Minimal reflux symptoms noted. No red flag signs.      Relevant Orders   CBC with Differential/Platelet   Hyperlipidemia associated with type 2 diabetes mellitus (HCC) (Chronic)   Continue zetia  - unable to take statins Will check labs.      Relevant Medications   hydrochlorothiazide  (HYDRODIURIL ) 25 MG tablet    Other Relevant Orders   Lipid panel   Statin myopathy (Chronic)   Type II diabetes mellitus with complication (HCC) (Chronic)   Blood sugars have been stable.  No hypoglycemic events since last visit. Currently medications are none. Last visit medical regimen changes were none. Lab Results  Component Value Date   HGBA1C 6.4 (A) 07/14/2023          Relevant Orders   Comprehensive metabolic panel with GFR   Hemoglobin A1c   Microalbumin / creatinine urine ratio   Foraminal stenosis of lumbar region   Symptoms are improving since physical therapy. She continues home exercises.      Other Visit Diagnoses       Annual physical exam    -  Primary   Stable exam - normal for age. up to date on screenings continue healthy diet, exercise  Encounter for screening mammogram for breast cancer       schedule for February 2026       Return in about 4 months (around 05/11/2024) for DM, HTN.    Leita HILARIO Adie, MD [facility] [place]

## 2024-01-10 NOTE — Assessment & Plan Note (Addendum)
 Blood pressure is fairly well controlled on irbesartan  and amlodipine . No medication side effects noted.  She has been getting >140 consistently at home.  No changes in diet or medications to attribute. Continue same medications but add hydrochlorothiazide  25 mg daily and recheck in 2 months.

## 2024-01-10 NOTE — Assessment & Plan Note (Signed)
 Minimal reflux symptoms noted. No red flag signs.

## 2024-01-10 NOTE — Assessment & Plan Note (Signed)
 Symptoms are improving since physical therapy. She continues home exercises.

## 2024-01-11 LAB — CBC WITH DIFFERENTIAL/PLATELET
Basophils Absolute: 0.1 x10E3/uL (ref 0.0–0.2)
Basos: 2 %
EOS (ABSOLUTE): 0.1 x10E3/uL (ref 0.0–0.4)
Eos: 3 %
Hematocrit: 40.1 % (ref 34.0–46.6)
Hemoglobin: 13.2 g/dL (ref 11.1–15.9)
Immature Grans (Abs): 0 x10E3/uL (ref 0.0–0.1)
Immature Granulocytes: 0 %
Lymphocytes Absolute: 2 x10E3/uL (ref 0.7–3.1)
Lymphs: 41 %
MCH: 31 pg (ref 26.6–33.0)
MCHC: 32.9 g/dL (ref 31.5–35.7)
MCV: 94 fL (ref 79–97)
Monocytes Absolute: 0.5 x10E3/uL (ref 0.1–0.9)
Monocytes: 11 %
Neutrophils Absolute: 2.1 x10E3/uL (ref 1.4–7.0)
Neutrophils: 43 %
Platelets: 237 x10E3/uL (ref 150–450)
RBC: 4.26 x10E6/uL (ref 3.77–5.28)
RDW: 12.4 % (ref 11.7–15.4)
WBC: 4.9 x10E3/uL (ref 3.4–10.8)

## 2024-01-11 LAB — COMPREHENSIVE METABOLIC PANEL WITH GFR
ALT: 14 IU/L (ref 0–32)
AST: 21 IU/L (ref 0–40)
Albumin: 4.4 g/dL (ref 3.7–4.7)
Alkaline Phosphatase: 126 IU/L — ABNORMAL HIGH (ref 44–121)
BUN/Creatinine Ratio: 8 — ABNORMAL LOW (ref 12–28)
BUN: 8 mg/dL (ref 8–27)
Bilirubin Total: 0.4 mg/dL (ref 0.0–1.2)
CO2: 24 mmol/L (ref 20–29)
Calcium: 10.1 mg/dL (ref 8.7–10.3)
Chloride: 99 mmol/L (ref 96–106)
Creatinine, Ser: 0.97 mg/dL (ref 0.57–1.00)
Globulin, Total: 3.5 g/dL (ref 1.5–4.5)
Glucose: 119 mg/dL — ABNORMAL HIGH (ref 70–99)
Potassium: 4.3 mmol/L (ref 3.5–5.2)
Sodium: 139 mmol/L (ref 134–144)
Total Protein: 7.9 g/dL (ref 6.0–8.5)
eGFR: 58 mL/min/1.73 — ABNORMAL LOW (ref >59)

## 2024-01-11 LAB — MICROALBUMIN / CREATININE URINE RATIO
Creatinine, Urine: 34.7 mg/dL
Microalb/Creat Ratio: 10 mg/g{creat} (ref 0–29)
Microalbumin, Urine: 3.6 ug/mL

## 2024-01-11 LAB — LIPID PANEL
Chol/HDL Ratio: 3.4 ratio (ref 0.0–4.4)
Cholesterol, Total: 238 mg/dL — ABNORMAL HIGH (ref 100–199)
HDL: 70 mg/dL (ref >39)
LDL Chol Calc (NIH): 121 mg/dL — ABNORMAL HIGH (ref 0–99)
Triglycerides: 269 mg/dL — ABNORMAL HIGH (ref 0–149)
VLDL Cholesterol Cal: 47 mg/dL — ABNORMAL HIGH (ref 5–40)

## 2024-01-11 LAB — HEMOGLOBIN A1C
Est. average glucose Bld gHb Est-mCnc: 131 mg/dL
Hgb A1c MFr Bld: 6.2 % — ABNORMAL HIGH (ref 4.8–5.6)

## 2024-01-11 LAB — TSH: TSH: 0.985 u[IU]/mL (ref 0.450–4.500)

## 2024-01-12 ENCOUNTER — Ambulatory Visit: Payer: Self-pay | Admitting: Internal Medicine

## 2024-01-12 ENCOUNTER — Other Ambulatory Visit: Payer: Self-pay | Admitting: Internal Medicine

## 2024-01-12 DIAGNOSIS — F419 Anxiety disorder, unspecified: Secondary | ICD-10-CM

## 2024-01-15 NOTE — Telephone Encounter (Signed)
 Requested Prescriptions  Pending Prescriptions Disp Refills   buPROPion  (WELLBUTRIN  XL) 300 MG 24 hr tablet [Pharmacy Med Name: buPROPion  HCl ER (XL) 300 MG Oral Tablet Extended Release 24 Hour] 90 tablet 0    Sig: Take 1 tablet by mouth once daily     Psychiatry: Antidepressants - bupropion  Failed - 01/15/2024 10:01 AM      Failed - Last BP in normal range    BP Readings from Last 1 Encounters:  01/10/24 (!) 146/80         Passed - Cr in normal range and within 360 days    Creatinine, Ser  Date Value Ref Range Status  01/10/2024 0.97 0.57 - 1.00 mg/dL Final         Passed - AST in normal range and within 360 days    AST  Date Value Ref Range Status  01/10/2024 21 0 - 40 IU/L Final         Passed - ALT in normal range and within 360 days    ALT  Date Value Ref Range Status  01/10/2024 14 0 - 32 IU/L Final         Passed - Valid encounter within last 6 months    Recent Outpatient Visits           5 days ago Annual physical exam   [place] [facility] at [facility], Leita DEL, MD   3 months ago Type II diabetes mellitus with complication [facility])   [place] [facility] at [facility], Leita DEL, MD   4 months ago Localized edema   [place] [facility] at [facility], Leita DEL, MD   4 months ago Acute right-sided low back pain with right-sided sciatica   [facility] at [facility], Leita DEL, MD   4 months ago Acute right-sided low back pain with right-sided sciatica   [facility] at [facility], Leita DEL, MD

## 2024-01-17 DIAGNOSIS — G4733 Obstructive sleep apnea (adult) (pediatric): Secondary | ICD-10-CM | POA: Diagnosis not present

## 2024-01-27 DIAGNOSIS — G4733 Obstructive sleep apnea (adult) (pediatric): Secondary | ICD-10-CM | POA: Diagnosis not present

## 2024-02-16 ENCOUNTER — Other Ambulatory Visit: Payer: Self-pay | Admitting: Internal Medicine

## 2024-02-16 DIAGNOSIS — G4733 Obstructive sleep apnea (adult) (pediatric): Secondary | ICD-10-CM | POA: Diagnosis not present

## 2024-02-16 DIAGNOSIS — I10 Essential (primary) hypertension: Secondary | ICD-10-CM

## 2024-02-19 NOTE — Telephone Encounter (Signed)
 Requested Prescriptions  Pending Prescriptions Disp Refills   irbesartan  (AVAPRO ) 300 MG tablet [Pharmacy Med Name: Irbesartan  300 MG Oral Tablet] 90 tablet 0    Sig: Take 1 tablet by mouth once daily     Cardiovascular:  Angiotensin Receptor Blockers Failed - 02/19/2024  2:50 PM      Failed - Last BP in normal range    BP Readings from Last 1 Encounters:  01/10/24 (!) 146/80         Passed - Cr in normal range and within 180 days    Creatinine, Ser  Date Value Ref Range Status  01/10/2024 0.97 0.57 - 1.00 mg/dL Final         Passed - K in normal range and within 180 days    Potassium  Date Value Ref Range Status  01/10/2024 4.3 3.5 - 5.2 mmol/L Final         Passed - Patient is not pregnant      Passed - Valid encounter within last 6 months    Recent Outpatient Visits           1 month ago Annual physical exam   [place] [facility] at [facility], Leita DEL, MD   4 months ago Type II diabetes mellitus with complication [facility])   [place] [facility] at [facility], Leita DEL, MD   5 months ago Localized edema    Primary Care & Sports Medicine at [facility], Leita DEL, MD   5 months ago Acute right-sided low back pain with right-sided sciatica   [facility] at [facility], Leita DEL, MD   6 months ago Acute right-sided low back pain with right-sided sciatica   [facility] at [facility], Leita DEL, MD

## 2024-03-04 ENCOUNTER — Ambulatory Visit (INDEPENDENT_AMBULATORY_CARE_PROVIDER_SITE_OTHER): Payer: Medicare (Managed Care) | Admitting: Internal Medicine

## 2024-03-04 ENCOUNTER — Encounter: Payer: Self-pay | Admitting: Internal Medicine

## 2024-03-04 ENCOUNTER — Other Ambulatory Visit: Payer: Self-pay | Admitting: Internal Medicine

## 2024-03-04 VITALS — BP 124/66 | HR 66 | Ht 66.5 in | Wt 178.0 lb

## 2024-03-04 DIAGNOSIS — H6692 Otitis media, unspecified, left ear: Secondary | ICD-10-CM

## 2024-03-04 DIAGNOSIS — I10 Essential (primary) hypertension: Secondary | ICD-10-CM

## 2024-03-04 DIAGNOSIS — J0110 Acute frontal sinusitis, unspecified: Secondary | ICD-10-CM | POA: Diagnosis not present

## 2024-03-04 DIAGNOSIS — K219 Gastro-esophageal reflux disease without esophagitis: Secondary | ICD-10-CM

## 2024-03-04 MED ORDER — AZITHROMYCIN 250 MG PO TABS: ORAL_TABLET | ORAL | 0 refills | Status: AC

## 2024-03-04 MED ORDER — AMLODIPINE BESYLATE 5 MG PO TABS: 5.0000 mg | ORAL_TABLET | Freq: Every day | ORAL | 0 refills | Status: DC

## 2024-03-04 NOTE — Progress Notes (Signed)
 Date:  03/04/2024   Name:  Molly Ruiz   DOB:  1940/06/08   MRN:  [id number]   Chief Complaint: Dizziness (X 3 days. Pt c/o dizziness when she bends over and tries to stand back up. Pt has checked BP at home and it is normally around 133-67. Vitals stable today. Pt takes amlodipine , hydrochlorothiazide , and irbesartan  daily.)  Sinusitis This is a new problem. The current episode started in the past 7 days. The problem is unchanged. There has been no fever. The pain is mild. Associated symptoms include congestion, ear pain, headaches and sinus pressure. Pertinent negatives include no chills, coughing, hoarse voice, shortness of breath or sore throat. Treatments tried: benadryl. The treatment provided moderate relief.  Hypertension This is a chronic problem. The problem is controlled. Associated symptoms include headaches. Pertinent negatives include no chest pain, palpitations or shortness of breath. Past treatments include angiotensin blockers, calcium  channel blockers and diuretics. Hypertensive end-organ damage includes kidney disease. There is no history of CAD/MI or CVA.    Review of Systems  Constitutional:  Negative for chills and fatigue.  HENT:  Positive for congestion, ear pain and sinus pressure. Negative for hoarse voice, sore throat and trouble swallowing.   Eyes:  Negative for visual disturbance.  Respiratory:  Negative for cough, shortness of breath and wheezing.   Cardiovascular:  Negative for chest pain and palpitations.  Neurological:  Positive for dizziness and headaches.  Psychiatric/Behavioral:  Negative for dysphoric mood and sleep disturbance. The patient is not nervous/anxious.      Lab Results  Component Value Date   NA 139 01/10/2024   K 4.3 01/10/2024   CO2 24 01/10/2024   GLUCOSE 119 (H) 01/10/2024   BUN 8 01/10/2024   CREATININE 0.97 01/10/2024   CALCIUM  10.1 01/10/2024   EGFR 58 (L) 01/10/2024   GFRNONAA >60 09/28/2023   Lab Results  Component  Value Date   CHOL 238 (H) 01/10/2024   HDL 70 01/10/2024   LDLCALC 121 (H) 01/10/2024   TRIG 269 (H) 01/10/2024   CHOLHDL 3.4 01/10/2024   Lab Results  Component Value Date   TSH 0.985 01/10/2024   Lab Results  Component Value Date   HGBA1C 6.2 (H) 01/10/2024   Lab Results  Component Value Date   WBC 4.9 01/10/2024   HGB 13.2 01/10/2024   HCT 40.1 01/10/2024   MCV 94 01/10/2024   PLT 237 01/10/2024   Lab Results  Component Value Date   ALT 14 01/10/2024   AST 21 01/10/2024   ALKPHOS 126 (H) 01/10/2024   BILITOT 0.4 01/10/2024   Lab Results  Component Value Date   VD25OH 37.3 11/02/2021     Patient Active Problem List   Diagnosis Date Noted   Lumbar radiculopathy 09/26/2023   Foraminal stenosis of lumbar region 09/26/2023   Spinal stenosis, lumbar region, with neurogenic claudication 09/26/2023   Acute right-sided low back pain with right-sided sciatica 08/25/2023   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 03/09/2022   Stage 3a chronic kidney disease (HCC) 11/29/2021   Acute pain of right shoulder 09/24/2020   Statin myopathy 03/31/2020   Gastroesophageal reflux disease 09/19/2019   Special screening for malignant neoplasms, colon    Tubular adenoma of colon    Anxiety disorder 06/13/2019   Osteopenia determined by x-ray 06/06/2018   Essential hypertension 04/26/2018   Eczema 10/25/2017   Slow transit constipation 02/06/2017   Primary osteoarthritis of left hip 01/12/2017   OSA on CPAP  01/02/2017   Type II diabetes mellitus with complication (HCC) 06/24/2016   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   Hyperlipidemia associated with type 2 diabetes mellitus (HCC) 06/24/2016   Obesity (BMI 30.0-34.9) 06/24/2016   Vitamin D  deficiency 06/24/2016   History of shingles 06/24/2016   Allergic rhinitis 06/24/2016    Allergies  Allergen Reactions   Atorvastatin  Other (See Comments)    Peripheral neuropathy and myalgia   Influenza Vaccines  Rash    Past Surgical History:  Procedure Laterality Date   ABDOMINAL HYSTERECTOMY     BILATERAL CARPAL TUNNEL RELEASE     CATARACT EXTRACTION W/PHACO Right 11/12/2018   Procedure: CATARACT EXTRACTION PHACO AND INTRAOCULAR LENS PLACEMENT (IOC)  RIGHT DIABETIC;  Surgeon: Myrna Adine Anes, MD;  Location: [facility];  Service: Ophthalmology;  Laterality: Right;  Diabetic - diet controlled sleep apnea   CERVICAL FUSION  2004   COLONOSCOPY WITH PROPOFOL  N/A 07/22/2019   Procedure: COLONOSCOPY WITH BIOPSIES;  Surgeon: Jinny Carmine, MD;  Location: [facility];  Service: Endoscopy;  Laterality: N/A;  Diabetic - diet controlled priority 4   FOOT SURGERY     HAND SURGERY Left 06/2022   palmar cyst removed   LUMBAR DISC SURGERY  1998   POLYPECTOMY N/A 07/22/2019   Procedure: POLYPECTOMY;  Surgeon: Jinny Carmine, MD;  Location: [facility];  Service: Endoscopy;  Laterality: N/A;    Social History   Tobacco Use   Smoking status: Never   Smokeless tobacco: Never  Vaping Use   Vaping status: Never Used  Substance Use Topics   Alcohol use: No   Drug use: No     Medication list has been reviewed and updated.  Current Meds  Medication Sig   acetaminophen  (TYLENOL ) 500 MG tablet Take 2 tablets (1,000 mg total) by mouth 2 (two) times daily.   azithromycin  (ZITHROMAX  Z-PAK) 250 MG tablet UAD   Bisacodyl (LAXATIVE PO) Take by mouth. Generic OTC PRN   Blood Glucose Monitoring Suppl DEVI 1 each by Does not apply route in the morning, at noon, and at bedtime. May substitute to any manufacturer covered by patient's insurance.   buPROPion  (WELLBUTRIN  XL) 300 MG 24 hr tablet Take 1 tablet by mouth once daily   CALCIUM  CITRATE PO Take 1,200 mg by mouth daily.   cholecalciferol (VITAMIN D3) 25 MCG (1000 UNIT) tablet Take 1,000 Units by mouth daily.   ezetimibe  (ZETIA ) 10 MG tablet Take 1 tablet by mouth once daily   famotidine  (PEPCID ) 40 MG tablet Take 1 tablet (40 mg  total) by mouth at bedtime.   glucose blood (ONETOUCH ULTRA) test strip Test Blood Sugar twice daily.   hydrochlorothiazide  (HYDRODIURIL ) 25 MG tablet Take 1 tablet (25 mg total) by mouth daily.   irbesartan  (AVAPRO ) 300 MG tablet Take 1 tablet by mouth once daily   meloxicam  (MOBIC ) 15 MG tablet Take 1 tablet by mouth once daily   Multiple Vitamins-Minerals (MULTIVITAMIN ADULTS 50+ PO) Take 1 tablet by mouth daily.   mupirocin  ointment (BACTROBAN ) 2 % Apply 1 Application topically 2 (two) times daily.   NON FORMULARY CPAP @@ 12 cm H20   nystatin  cream (MYCOSTATIN ) Apply to affected area 2 times daily   nystatin -triamcinolone  ointment (MYCOLOG) Apply 1 Application topically 2 (two) times daily.   Omega-3 Fatty Acids (FISH OIL) 1000 MG CAPS Take 1 capsule by mouth daily.   OneTouch Delica Lancets 33G MISC Inject 1 each into the skin daily.   REFRESH 1.4-0.6 %  SOLN Place 1 drop into both eyes as needed.   [DISCONTINUED] amLODipine  (NORVASC ) 5 MG tablet Take 1 tablet by mouth once daily       03/04/2024    1:35 PM 01/10/2024   10:40 AM 10/04/2023    2:06 PM 09/07/2023   11:01 AM  GAD 7 : Generalized Anxiety Score  Nervous, Anxious, on Edge 0 0 0 1  Control/stop worrying 0 0 0 0  Worry too much - different things 0 0 0 0  Trouble relaxing 0 0 0 1  Restless 0 0 3 1  Easily annoyed or irritable 0 0 0 1  Afraid - awful might happen 0 0 0 0  Total GAD 7 Score 0 0 3 4  Anxiety Difficulty Not difficult at all Not difficult at all Not difficult at all Not difficult at all       03/04/2024    1:35 PM 01/10/2024   10:40 AM 11/07/2023   10:28 AM  Depression screen PHQ 2/9  Decreased Interest 0 0 0  Down, Depressed, Hopeless 0 0 0  PHQ - 2 Score 0 0 0  Altered sleeping 0 0   Tired, decreased energy 0 0   Change in appetite 0 0   Feeling bad or failure about yourself  0 0   Trouble concentrating 0 0   Moving slowly or fidgety/restless 0 0   Suicidal thoughts 0 0   PHQ-9 Score 0 0    Difficult doing work/chores Not difficult at all Not difficult at all     BP Readings from Last 3 Encounters:  03/04/24 124/66  01/10/24 (!) 146/80  11/07/23 (!) 144/65    Physical Exam Vitals and nursing note reviewed.  Constitutional:      General: She is not in acute distress.    Appearance: Normal appearance. She is well-developed.  HENT:     Head: Normocephalic and atraumatic.     Right Ear: There is impacted cerumen.     Left Ear: Tympanic membrane is retracted. Tympanic membrane is not erythematous.     Nose:     Right Sinus: Frontal sinus tenderness present. No maxillary sinus tenderness.     Left Sinus: Frontal sinus tenderness present. No maxillary sinus tenderness.     Mouth/Throat:     Pharynx: Oropharynx is clear.  Cardiovascular:     Rate and Rhythm: Normal rate and regular rhythm.  Pulmonary:     Effort: Pulmonary effort is normal. No respiratory distress.     Breath sounds: No wheezing or rhonchi.  Musculoskeletal:     Cervical back: Normal range of motion.  Lymphadenopathy:     Cervical: No cervical adenopathy.  Skin:    General: Skin is warm and dry.     Findings: No rash.  Neurological:     Mental Status: She is alert and oriented to person, place, and time.  Psychiatric:        Mood and Affect: Mood normal.        Behavior: Behavior normal.     Wt Readings from Last 3 Encounters:  03/04/24 178 lb (80.7 kg)  01/10/24 187 lb (84.8 kg)  11/07/23 188 lb (85.3 kg)    BP 124/66   Pulse 66   Ht 5' 6.5 (1.689 m)   Wt 178 lb (80.7 kg)   SpO2 95%   BMI 28.30 kg/m   Assessment and Plan:  Problem List Items Addressed This Visit       Unprioritized  Essential hypertension (Chronic)   Well controlled blood pressure today. Current regimen is amlodipine , hydrochlorothiazide  and irbesartan . No medication side effects noted.        Relevant Medications   amLODipine  (NORVASC ) 5 MG tablet   Other Visit Diagnoses       Acute non-recurrent  frontal sinusitis    -  Primary   Zpak, fluids and benadryl bid prn   Relevant Medications   azithromycin  (ZITHROMAX  Z-PAK) 250 MG tablet     Otitis of left ear       treat with zpak, benadryl as needed   Relevant Medications   azithromycin  (ZITHROMAX  Z-PAK) 250 MG tablet       No follow-ups on file.    Leita HILARIO Adie, MD [facility] [place]

## 2024-03-04 NOTE — Assessment & Plan Note (Signed)
 Well controlled blood pressure today. Current regimen is amlodipine , hydrochlorothiazide  and irbesartan . No medication side effects noted.

## 2024-03-05 NOTE — Telephone Encounter (Signed)
 Requested Prescriptions  Pending Prescriptions Disp Refills   famotidine  (PEPCID ) 40 MG tablet [Pharmacy Med Name: Famotidine  40 MG Oral Tablet] 90 tablet 0    Sig: TAKE 1 TABLET BY MOUTH AT BEDTIME     Gastroenterology:  H2 Antagonists Passed - 03/05/2024  1:52 PM      Passed - Valid encounter within last 12 months    Recent Outpatient Visits           Yesterday Acute non-recurrent frontal sinusitis   [place] [facility] at [facility], Leita DEL, MD   1 month ago Annual physical exam   [facility] at [facility], Leita DEL, MD   5 months ago Type II diabetes mellitus with complication [facility])   [place] [facility] at [facility], Leita DEL, MD   6 months ago Localized edema   [place] [facility] at [facility], Leita DEL, MD   6 months ago Acute right-sided low back pain with right-sided sciatica   [place] [facility] at [facility], Leita DEL, MD              Refused Prescriptions Disp Refills   amLODipine  (NORVASC ) 5 MG tablet [Pharmacy Med Name: amLODIPine  Besylate 5 MG Oral Tablet] 90 tablet 0    Sig: Take 1 tablet by mouth once daily     Cardiovascular: Calcium  Channel Blockers 2 Passed - 03/05/2024  1:52 PM      Passed - Last BP in normal range    BP Readings from Last 1 Encounters:  03/04/24 124/66         Passed - Last Heart Rate in normal range    Pulse Readings from Last 1 Encounters:  03/04/24 66         Passed - Valid encounter within last 6 months    Recent Outpatient Visits           Yesterday Acute non-recurrent frontal sinusitis   [place] [facility] at [facility], Leita DEL, MD   1 month ago Annual physical exam   [facility] at [facility], Leita DEL, MD   5 months ago Type II diabetes  mellitus with complication [facility])   [place] [facility] at [facility], Leita DEL, MD   6 months ago Localized edema   [place] [facility] at [facility], Leita DEL, MD   6 months ago Acute right-sided low back pain with right-sided sciatica   [facility] at [facility], Leita DEL, MD

## 2024-03-12 ENCOUNTER — Ambulatory Visit: Payer: Medicare (Managed Care) | Admitting: Internal Medicine

## 2024-03-18 DIAGNOSIS — G4733 Obstructive sleep apnea (adult) (pediatric): Secondary | ICD-10-CM | POA: Diagnosis not present

## 2024-04-06 ENCOUNTER — Other Ambulatory Visit: Payer: Self-pay | Admitting: Internal Medicine

## 2024-04-06 DIAGNOSIS — E1169 Type 2 diabetes mellitus with other specified complication: Secondary | ICD-10-CM

## 2024-04-10 NOTE — Telephone Encounter (Signed)
 Requested Prescriptions  Pending Prescriptions Disp Refills   ezetimibe  (ZETIA ) 10 MG tablet [Pharmacy Med Name: Ezetimibe  10 MG Oral Tablet] 90 tablet 0    Sig: Take 1 tablet by mouth once daily     Cardiovascular:  Antilipid - Sterol Transport Inhibitors Failed - 04/10/2024  9:40 AM      Failed - Lipid Panel in normal range within the last 12 months    Cholesterol, Total  Date Value Ref Range Status  01/10/2024 238 (H) 100 - 199 mg/dL Final   LDL Chol Calc (NIH)  Date Value Ref Range Status  01/10/2024 121 (H) 0 - 99 mg/dL Final   HDL  Date Value Ref Range Status  01/10/2024 70 >39 mg/dL Final   Triglycerides  Date Value Ref Range Status  01/10/2024 269 (H) 0 - 149 mg/dL Final         Passed - AST in normal range and within 360 days    AST  Date Value Ref Range Status  01/10/2024 21 0 - 40 IU/L Final         Passed - ALT in normal range and within 360 days    ALT  Date Value Ref Range Status  01/10/2024 14 0 - 32 IU/L Final         Passed - Patient is not pregnant      Passed - Valid encounter within last 12 months    Recent Outpatient Visits           1 month ago Acute non-recurrent frontal sinusitis   [place] [facility] at [facility], Leita DEL, MD   3 months ago Annual physical exam   [facility] at [facility], Leita DEL, MD   6 months ago Type II diabetes mellitus with complication [facility])   [place] [facility] at [facility], Leita DEL, MD   7 months ago Localized edema   [place] [facility] at [facility], Leita DEL, MD   7 months ago Acute right-sided low back pain with right-sided sciatica   [facility] at [facility], Leita DEL, MD

## 2024-04-13 ENCOUNTER — Other Ambulatory Visit: Payer: Self-pay | Admitting: Internal Medicine

## 2024-04-13 DIAGNOSIS — F419 Anxiety disorder, unspecified: Secondary | ICD-10-CM

## 2024-04-16 DIAGNOSIS — L249 Irritant contact dermatitis, unspecified cause: Secondary | ICD-10-CM | POA: Diagnosis not present

## 2024-04-16 NOTE — Telephone Encounter (Signed)
 Requested Prescriptions  Pending Prescriptions Disp Refills   buPROPion  (WELLBUTRIN  XL) 300 MG 24 hr tablet [Pharmacy Med Name: buPROPion  HCl ER (XL) 300 MG Oral Tablet Extended Release 24 Hour] 90 tablet 0    Sig: Take 1 tablet by mouth once daily     Psychiatry: Antidepressants - bupropion  Passed - 04/16/2024 11:29 AM      Passed - Cr in normal range and within 360 days    Creatinine, Ser  Date Value Ref Range Status  01/10/2024 0.97 0.57 - 1.00 mg/dL Final         Passed - AST in normal range and within 360 days    AST  Date Value Ref Range Status  01/10/2024 21 0 - 40 IU/L Final         Passed - ALT in normal range and within 360 days    ALT  Date Value Ref Range Status  01/10/2024 14 0 - 32 IU/L Final         Passed - Last BP in normal range    BP Readings from Last 1 Encounters:  03/04/24 124/66         Passed - Valid encounter within last 6 months    Recent Outpatient Visits           1 month ago Acute non-recurrent frontal sinusitis   [place] [facility] at [facility], Leita DEL, MD   3 months ago Annual physical exam   [facility] at [facility], Leita DEL, MD   6 months ago Type II diabetes mellitus with complication [facility])   [place] [facility] at [facility], Leita DEL, MD   7 months ago Localized edema   [place] [facility] at [facility], Leita DEL, MD   7 months ago Acute right-sided low back pain with right-sided sciatica   [facility] at [facility], Leita DEL, MD

## 2024-05-10 ENCOUNTER — Encounter: Payer: Medicare (Managed Care) | Admitting: Student

## 2024-05-29 ENCOUNTER — Other Ambulatory Visit: Payer: Self-pay

## 2024-05-29 DIAGNOSIS — I10 Essential (primary) hypertension: Secondary | ICD-10-CM

## 2024-05-29 MED ORDER — AMLODIPINE BESYLATE 5 MG PO TABS: 5.0000 mg | ORAL_TABLET | Freq: Every day | ORAL | 0 refills | Status: AC

## 2024-06-11 ENCOUNTER — Other Ambulatory Visit: Payer: Self-pay

## 2024-06-11 DIAGNOSIS — I10 Essential (primary) hypertension: Secondary | ICD-10-CM

## 2024-06-11 MED ORDER — IRBESARTAN 300 MG PO TABS: 300.0000 mg | ORAL_TABLET | Freq: Every day | ORAL | 0 refills | Status: AC

## 2024-06-17 ENCOUNTER — Encounter: Payer: Medicare (Managed Care) | Admitting: Student

## 2024-07-10 ENCOUNTER — Ambulatory Visit: Payer: Medicare (Managed Care)
